# Patient Record
Sex: Female | Born: 1945 | Race: White | Hispanic: No | Marital: Single | Smoking: Never smoker
Health system: Southern US, Community
[De-identification: ages and names within clinical notes are randomized; demographics above are authoritative.]

## PROBLEM LIST (undated history)

## (undated) DIAGNOSIS — M545 Low back pain, unspecified: Secondary | ICD-10-CM

## (undated) DIAGNOSIS — M810 Age-related osteoporosis without current pathological fracture: Secondary | ICD-10-CM

## (undated) DIAGNOSIS — M4310 Spondylolisthesis, site unspecified: Secondary | ICD-10-CM

## (undated) DIAGNOSIS — M1990 Unspecified osteoarthritis, unspecified site: Secondary | ICD-10-CM

## (undated) DIAGNOSIS — M5416 Radiculopathy, lumbar region: Secondary | ICD-10-CM

## (undated) DIAGNOSIS — K5901 Slow transit constipation: Secondary | ICD-10-CM

## (undated) DIAGNOSIS — E7800 Pure hypercholesterolemia, unspecified: Secondary | ICD-10-CM

## (undated) DIAGNOSIS — F32A Depression, unspecified: Secondary | ICD-10-CM

## (undated) DIAGNOSIS — M4800 Spinal stenosis, site unspecified: Secondary | ICD-10-CM

## (undated) DIAGNOSIS — R0982 Postnasal drip: Secondary | ICD-10-CM

## (undated) DIAGNOSIS — D649 Anemia, unspecified: Secondary | ICD-10-CM

## (undated) DIAGNOSIS — E559 Vitamin D deficiency, unspecified: Secondary | ICD-10-CM

## (undated) DIAGNOSIS — R29898 Other symptoms and signs involving the musculoskeletal system: Secondary | ICD-10-CM

## (undated) DIAGNOSIS — M47816 Spondylosis without myelopathy or radiculopathy, lumbar region: Secondary | ICD-10-CM

## (undated) DIAGNOSIS — R51 Headache: Secondary | ICD-10-CM

## (undated) DIAGNOSIS — F429 Obsessive-compulsive disorder, unspecified: Secondary | ICD-10-CM

## (undated) DIAGNOSIS — M5136 Other intervertebral disc degeneration, lumbar region: Secondary | ICD-10-CM

## (undated) DIAGNOSIS — F329 Major depressive disorder, single episode, unspecified: Secondary | ICD-10-CM

## (undated) DIAGNOSIS — M51369 Other intervertebral disc degeneration, lumbar region without mention of lumbar back pain or lower extremity pain: Secondary | ICD-10-CM

## (undated) DIAGNOSIS — J45909 Unspecified asthma, uncomplicated: Secondary | ICD-10-CM

## (undated) DIAGNOSIS — E785 Hyperlipidemia, unspecified: Secondary | ICD-10-CM

## (undated) DIAGNOSIS — F419 Anxiety disorder, unspecified: Secondary | ICD-10-CM

## (undated) DIAGNOSIS — R519 Headache, unspecified: Secondary | ICD-10-CM

## (undated) DIAGNOSIS — M48062 Spinal stenosis, lumbar region with neurogenic claudication: Secondary | ICD-10-CM

## (undated) HISTORY — DX: Low back pain: M54.5

## (undated) HISTORY — DX: Hyperlipidemia, unspecified: E78.5

## (undated) HISTORY — PX: OTHER SURGICAL HISTORY: SHX169

## (undated) HISTORY — DX: Low back pain, unspecified: M54.50

## (undated) HISTORY — DX: Other symptoms and signs involving the musculoskeletal system: R29.898

## (undated) HISTORY — DX: Other intervertebral disc degeneration, lumbar region without mention of lumbar back pain or lower extremity pain: M51.369

## (undated) HISTORY — DX: Spondylosis without myelopathy or radiculopathy, lumbar region: M47.816

## (undated) HISTORY — DX: Vitamin D deficiency, unspecified: E55.9

## (undated) HISTORY — DX: Slow transit constipation: K59.01

## (undated) HISTORY — DX: Other intervertebral disc degeneration, lumbar region: M51.36

## (undated) HISTORY — DX: Spinal stenosis, lumbar region with neurogenic claudication: M48.062

## (undated) HISTORY — DX: Spondylolisthesis, site unspecified: M43.10

## (undated) HISTORY — DX: Age-related osteoporosis without current pathological fracture: M81.0

## (undated) HISTORY — PX: BACK SURGERY: SHX140

## (undated) HISTORY — DX: Postnasal drip: R09.82

## (undated) HISTORY — DX: Radiculopathy, lumbar region: M54.16

## (undated) HISTORY — PX: TONSILLECTOMY: SUR1361

## (undated) HISTORY — PX: EYE SURGERY: SHX253

## (undated) HISTORY — DX: Spinal stenosis, site unspecified: M48.00

---

## 2003-08-25 ENCOUNTER — Other Ambulatory Visit: Admission: RE | Admit: 2003-08-25 | Discharge: 2003-08-25 | Payer: Self-pay | Admitting: Obstetrics and Gynecology

## 2004-04-08 ENCOUNTER — Encounter: Admission: RE | Admit: 2004-04-08 | Discharge: 2004-04-08 | Payer: Self-pay | Admitting: Family Medicine

## 2004-08-25 ENCOUNTER — Other Ambulatory Visit: Admission: RE | Admit: 2004-08-25 | Discharge: 2004-08-25 | Payer: Self-pay | Admitting: Obstetrics and Gynecology

## 2006-04-18 ENCOUNTER — Other Ambulatory Visit: Admission: RE | Admit: 2006-04-18 | Discharge: 2006-04-18 | Payer: Self-pay | Admitting: Obstetrics and Gynecology

## 2007-04-19 ENCOUNTER — Other Ambulatory Visit: Admission: RE | Admit: 2007-04-19 | Discharge: 2007-04-19 | Payer: Self-pay | Admitting: Obstetrics and Gynecology

## 2008-06-10 ENCOUNTER — Other Ambulatory Visit: Admission: RE | Admit: 2008-06-10 | Discharge: 2008-06-10 | Payer: Self-pay | Admitting: Obstetrics and Gynecology

## 2008-07-12 ENCOUNTER — Encounter: Admission: RE | Admit: 2008-07-12 | Discharge: 2008-07-12 | Payer: Self-pay | Admitting: Family Medicine

## 2009-07-31 ENCOUNTER — Other Ambulatory Visit: Admission: RE | Admit: 2009-07-31 | Discharge: 2009-07-31 | Payer: Self-pay | Admitting: Obstetrics and Gynecology

## 2013-05-23 ENCOUNTER — Other Ambulatory Visit: Payer: Self-pay | Admitting: Family Medicine

## 2013-05-23 ENCOUNTER — Other Ambulatory Visit (HOSPITAL_COMMUNITY)
Admission: RE | Admit: 2013-05-23 | Discharge: 2013-05-23 | Disposition: A | Discharge status: Home / Self Care | Payer: Medicare Other | Source: Ambulatory Visit | Attending: Family Medicine | Admitting: Family Medicine

## 2013-05-23 DIAGNOSIS — Z124 Encounter for screening for malignant neoplasm of cervix: Secondary | ICD-10-CM | POA: Insufficient documentation

## 2013-05-23 DIAGNOSIS — R87619 Unspecified abnormal cytological findings in specimens from cervix uteri: Secondary | ICD-10-CM | POA: Insufficient documentation

## 2013-05-23 DIAGNOSIS — Z1151 Encounter for screening for human papillomavirus (HPV): Secondary | ICD-10-CM | POA: Insufficient documentation

## 2015-05-04 ENCOUNTER — Other Ambulatory Visit: Payer: Self-pay | Admitting: Orthopedic Surgery

## 2015-05-04 DIAGNOSIS — M48061 Spinal stenosis, lumbar region without neurogenic claudication: Secondary | ICD-10-CM

## 2015-05-12 ENCOUNTER — Ambulatory Visit
Admission: RE | Admit: 2015-05-12 | Discharge: 2015-05-12 | Disposition: A | Discharge status: Home / Self Care | Payer: Medicare Other | Source: Ambulatory Visit | Attending: Orthopedic Surgery | Admitting: Orthopedic Surgery

## 2015-05-12 DIAGNOSIS — M48061 Spinal stenosis, lumbar region without neurogenic claudication: Secondary | ICD-10-CM

## 2015-07-12 HISTORY — PX: BACK SURGERY: SHX140

## 2015-08-21 DIAGNOSIS — M419 Scoliosis, unspecified: Secondary | ICD-10-CM | POA: Insufficient documentation

## 2015-08-21 DIAGNOSIS — M48061 Spinal stenosis, lumbar region without neurogenic claudication: Secondary | ICD-10-CM | POA: Diagnosis present

## 2015-08-21 DIAGNOSIS — M4310 Spondylolisthesis, site unspecified: Secondary | ICD-10-CM | POA: Insufficient documentation

## 2015-08-21 DIAGNOSIS — M5416 Radiculopathy, lumbar region: Secondary | ICD-10-CM | POA: Insufficient documentation

## 2015-09-02 ENCOUNTER — Other Ambulatory Visit: Payer: Self-pay | Admitting: Neurosurgery

## 2015-09-02 DIAGNOSIS — M4722 Other spondylosis with radiculopathy, cervical region: Secondary | ICD-10-CM

## 2015-09-03 ENCOUNTER — Other Ambulatory Visit: Payer: Self-pay | Admitting: Neurosurgery

## 2015-09-13 ENCOUNTER — Ambulatory Visit
Admission: RE | Admit: 2015-09-13 | Discharge: 2015-09-13 | Disposition: A | Discharge status: Home / Self Care | Payer: Medicare Other | Source: Ambulatory Visit | Attending: Neurosurgery | Admitting: Neurosurgery

## 2015-09-13 DIAGNOSIS — M4722 Other spondylosis with radiculopathy, cervical region: Secondary | ICD-10-CM

## 2015-09-28 ENCOUNTER — Encounter (HOSPITAL_COMMUNITY): Payer: Self-pay

## 2015-09-28 ENCOUNTER — Encounter (HOSPITAL_COMMUNITY)
Admission: RE | Admit: 2015-09-28 | Discharge: 2015-09-28 | Disposition: A | Discharge status: Home / Self Care | Payer: Medicare Other | Source: Ambulatory Visit | Attending: Neurosurgery | Admitting: Neurosurgery

## 2015-09-28 DIAGNOSIS — M4806 Spinal stenosis, lumbar region: Secondary | ICD-10-CM | POA: Insufficient documentation

## 2015-09-28 DIAGNOSIS — Z0183 Encounter for blood typing: Secondary | ICD-10-CM | POA: Diagnosis not present

## 2015-09-28 DIAGNOSIS — J45909 Unspecified asthma, uncomplicated: Secondary | ICD-10-CM | POA: Diagnosis not present

## 2015-09-28 DIAGNOSIS — Z01812 Encounter for preprocedural laboratory examination: Secondary | ICD-10-CM | POA: Diagnosis not present

## 2015-09-28 DIAGNOSIS — Z01818 Encounter for other preprocedural examination: Secondary | ICD-10-CM | POA: Insufficient documentation

## 2015-09-28 HISTORY — DX: Depression, unspecified: F32.A

## 2015-09-28 HISTORY — DX: Pure hypercholesterolemia, unspecified: E78.00

## 2015-09-28 HISTORY — DX: Unspecified osteoarthritis, unspecified site: M19.90

## 2015-09-28 HISTORY — DX: Anxiety disorder, unspecified: F41.9

## 2015-09-28 HISTORY — DX: Major depressive disorder, single episode, unspecified: F32.9

## 2015-09-28 LAB — BASIC METABOLIC PANEL
Anion gap: 10 (ref 5–15)
BUN: 12 mg/dL (ref 4–21)
BUN: 12 mg/dL (ref 6–20)
CO2: 26 mmol/L (ref 22–32)
CREATININE: 0.9 mg/dL (ref 0.5–1.1)
Calcium: 9.8 mg/dL (ref 8.9–10.3)
Chloride: 103 mmol/L (ref 101–111)
Creatinine, Ser: 0.93 mg/dL (ref 0.44–1.00)
GFR calc Af Amer: >60 mL/min (ref >60)
GFR calc non Af Amer: >60 mL/min (ref >60)
Glucose, Bld: 84 mg/dL (ref 65–99)
Glucose: 84 mg/dL
Potassium: 4.6 mmol/L (ref 3.5–5.1)
SODIUM: 139 mmol/L (ref 137–147)
Sodium: 139 mmol/L (ref 135–145)

## 2015-09-28 LAB — ABO/RH: ABO/RH(D): O POS

## 2015-09-28 LAB — SURGICAL PCR SCREEN
MRSA, PCR: NEGATIVE
Staphylococcus aureus: NEGATIVE

## 2015-09-28 LAB — CBC
HCT: 40 % (ref 36.0–46.0)
Hemoglobin: 13.1 g/dL (ref 12.0–15.0)
MCH: 30.3 pg (ref 26.0–34.0)
MCHC: 32.8 g/dL (ref 30.0–36.0)
MCV: 92.6 fL (ref 78.0–100.0)
Platelets: 202 10*3/uL (ref 150–400)
RBC: 4.32 MIL/uL (ref 3.87–5.11)
RDW: 13 % (ref 11.5–15.5)
WBC: 5.1 10*3/uL (ref 4.0–10.5)

## 2015-09-28 LAB — TYPE AND SCREEN
ABO/RH(D): O POS
Antibody Screen: NEGATIVE

## 2015-09-28 LAB — CBC AND DIFFERENTIAL: WBC: 5.1 10^3/mL

## 2015-09-28 NOTE — Pre-Procedure Instructions (Signed)
    Renee PainBarbara S Monier  09/28/2015      [id number] - [place], [place] - [id number] [place] [address] Phone: [phone] Fax: [phone]    Your procedure is scheduled on 10/05/15.  [facility] Ad[facility] at 530 A.M.  Call this number if you have problems the morning of surgery:  [phone]   Remember:  Do not eat food or drink liquids after midnight.  Take these medicines the morning of surgery with A SIP OF WATER --tylenol,xanax,celexa,ultram   Do not wear jewelry, make-up or nail polish.  Do not wear lotions, powders, or perfumes.  You may wear deodorant.  Do not shave 48 hours prior to surgery.  Men may shave face and neck.  Do not bring valuables to the hospital.  [facility] is not responsible for any belongings or valuables.  Contacts, dentures or bridgework may not be worn into surgery.  Leave your suitcase in the car.  After surgery it may be brought to your room.  For patients admitted to the hospital, discharge time will be determined by your treatment team.  Patients discharged the day of surgery will not be allowed to drive home.   Name and phone number of your driver:   Special instructions:    Please read over the following fact sheets that you were given. Pain Booklet, Coughing and Deep Breathing, Blood Transfusion Information, MRSA Information and Surgical Site Infection Prevention

## 2015-10-04 MED ORDER — DEXTROSE 5 % IV SOLN
2.0000 g | INTRAVENOUS | Status: AC
  Administered 2015-10-05 (×2): 2 g via INTRAVENOUS
  Filled 2015-10-04: qty 20

## 2015-10-05 ENCOUNTER — Inpatient Hospital Stay (HOSPITAL_COMMUNITY): Payer: Medicare Other | Admitting: Anesthesiology

## 2015-10-05 ENCOUNTER — Encounter (HOSPITAL_COMMUNITY)
Admission: RE | Disposition: A | Discharge status: Skilled Nursing Facility | Payer: Self-pay | Source: Ambulatory Visit | Attending: Neurosurgery

## 2015-10-05 ENCOUNTER — Inpatient Hospital Stay (HOSPITAL_COMMUNITY)
Admission: RE | Admit: 2015-10-05 | Discharge: 2015-10-07 | DRG: 460 | Disposition: A | Discharge status: Skilled Nursing Facility | Payer: Medicare Other | Source: Ambulatory Visit | Attending: Neurosurgery | Admitting: Neurosurgery

## 2015-10-05 ENCOUNTER — Encounter (HOSPITAL_COMMUNITY): Payer: Self-pay | Admitting: Surgery

## 2015-10-05 ENCOUNTER — Observation Stay (HOSPITAL_COMMUNITY): Payer: Medicare Other

## 2015-10-05 DIAGNOSIS — M4726 Other spondylosis with radiculopathy, lumbar region: Secondary | ICD-10-CM | POA: Diagnosis present

## 2015-10-05 DIAGNOSIS — Z79899 Other long term (current) drug therapy: Secondary | ICD-10-CM

## 2015-10-05 DIAGNOSIS — M4806 Spinal stenosis, lumbar region: Principal | ICD-10-CM | POA: Diagnosis present

## 2015-10-05 DIAGNOSIS — Z7983 Long term (current) use of bisphosphonates: Secondary | ICD-10-CM

## 2015-10-05 DIAGNOSIS — M48062 Spinal stenosis, lumbar region with neurogenic claudication: Secondary | ICD-10-CM | POA: Diagnosis present

## 2015-10-05 DIAGNOSIS — Z880 Allergy status to penicillin: Secondary | ICD-10-CM

## 2015-10-05 DIAGNOSIS — M48061 Spinal stenosis, lumbar region without neurogenic claudication: Secondary | ICD-10-CM | POA: Diagnosis present

## 2015-10-05 DIAGNOSIS — Z419 Encounter for procedure for purposes other than remedying health state, unspecified: Secondary | ICD-10-CM

## 2015-10-05 DIAGNOSIS — M4316 Spondylolisthesis, lumbar region: Secondary | ICD-10-CM | POA: Diagnosis present

## 2015-10-05 DIAGNOSIS — Z881 Allergy status to other antibiotic agents status: Secondary | ICD-10-CM

## 2015-10-05 DIAGNOSIS — E7800 Pure hypercholesterolemia, unspecified: Secondary | ICD-10-CM | POA: Diagnosis present

## 2015-10-05 SURGERY — POSTERIOR LUMBAR FUSION 1 LEVEL
Anesthesia: General | Site: Back

## 2015-10-05 MED ORDER — CITALOPRAM HYDROBROMIDE 40 MG PO TABS
40.0000 mg | ORAL_TABLET | Freq: Every day | ORAL | Status: DC
  Administered 2015-10-06 – 2015-10-07 (×2): 40 mg via ORAL
  Filled 2015-10-05 (×4): qty 1

## 2015-10-05 MED ORDER — OXYCODONE-ACETAMINOPHEN 5-325 MG PO TABS: 1.0000 | ORAL_TABLET | ORAL | Status: DC | PRN

## 2015-10-05 MED ORDER — PHENYLEPHRINE 40 MCG/ML (10ML) SYRINGE FOR IV PUSH (FOR BLOOD PRESSURE SUPPORT)
PREFILLED_SYRINGE | INTRAVENOUS | Status: AC
  Filled 2015-10-05: qty 10

## 2015-10-05 MED ORDER — ONDANSETRON HCL 4 MG/2ML IJ SOLN
INTRAMUSCULAR | Status: AC
  Filled 2015-10-05: qty 2

## 2015-10-05 MED ORDER — ZOLPIDEM TARTRATE 5 MG PO TABS: 5.0000 mg | ORAL_TABLET | Freq: Every evening | ORAL | Status: DC | PRN

## 2015-10-05 MED ORDER — LIDOCAINE HCL (CARDIAC) 20 MG/ML IV SOLN
INTRAVENOUS | Status: AC
  Filled 2015-10-05: qty 5

## 2015-10-05 MED ORDER — ROCURONIUM BROMIDE 50 MG/5ML IV SOLN
INTRAVENOUS | Status: AC
  Filled 2015-10-05: qty 1

## 2015-10-05 MED ORDER — MIDAZOLAM HCL 5 MG/5ML IJ SOLN
INTRAMUSCULAR | Status: DC | PRN
  Administered 2015-10-05: 2 mg via INTRAVENOUS

## 2015-10-05 MED ORDER — PROPOFOL 10 MG/ML IV BOLUS
INTRAVENOUS | Status: AC
  Filled 2015-10-05: qty 20

## 2015-10-05 MED ORDER — KETOROLAC TROMETHAMINE 30 MG/ML IJ SOLN
30.0000 mg | Freq: Four times a day (QID) | INTRAMUSCULAR | Status: AC
  Administered 2015-10-05 – 2015-10-07 (×7): 30 mg via INTRAVENOUS
  Filled 2015-10-05 (×7): qty 1

## 2015-10-05 MED ORDER — GLYCOPYRROLATE 0.2 MG/ML IJ SOLN
INTRAMUSCULAR | Status: AC
  Filled 2015-10-05: qty 2

## 2015-10-05 MED ORDER — OXYCODONE HCL 5 MG/5ML PO SOLN: 5.0000 mg | Freq: Once | ORAL | Status: DC | PRN

## 2015-10-05 MED ORDER — 0.9 % SODIUM CHLORIDE (POUR BTL) OPTIME
TOPICAL | Status: DC | PRN
  Administered 2015-10-05: 1000 mL

## 2015-10-05 MED ORDER — ACETAMINOPHEN 10 MG/ML IV SOLN
INTRAVENOUS | Status: AC
  Administered 2015-10-05: 1000 mg via INTRAVENOUS
  Filled 2015-10-05: qty 100

## 2015-10-05 MED ORDER — FENTANYL CITRATE (PF) 100 MCG/2ML IJ SOLN: 25.0000 ug | INTRAMUSCULAR | Status: DC | PRN

## 2015-10-05 MED ORDER — EPHEDRINE SULFATE 50 MG/ML IJ SOLN
INTRAMUSCULAR | Status: DC | PRN
  Administered 2015-10-05 (×5): 10 mg via INTRAVENOUS

## 2015-10-05 MED ORDER — ONDANSETRON HCL 4 MG/2ML IJ SOLN
INTRAMUSCULAR | Status: DC | PRN
  Administered 2015-10-05: 4 mg via INTRAVENOUS

## 2015-10-05 MED ORDER — ONDANSETRON HCL 4 MG PO TABS: 4.0000 mg | ORAL_TABLET | Freq: Four times a day (QID) | ORAL | Status: DC | PRN

## 2015-10-05 MED ORDER — BISACODYL 10 MG RE SUPP: 10.0000 mg | Freq: Every day | RECTAL | Status: DC | PRN

## 2015-10-05 MED ORDER — MENTHOL 3 MG MT LOZG: 1.0000 | LOZENGE | OROMUCOSAL | Status: DC | PRN

## 2015-10-05 MED ORDER — ALPRAZOLAM 0.25 MG PO TABS
0.2500 mg | ORAL_TABLET | Freq: Two times a day (BID) | ORAL | Status: DC | PRN
  Administered 2015-10-07: 0.5 mg via ORAL
  Filled 2015-10-05: qty 2

## 2015-10-05 MED ORDER — LORATADINE 10 MG PO TABS
10.0000 mg | ORAL_TABLET | Freq: Every day | ORAL | Status: DC
  Administered 2015-10-06 – 2015-10-07 (×2): 10 mg via ORAL
  Filled 2015-10-05 (×2): qty 1

## 2015-10-05 MED ORDER — OXYCODONE HCL 5 MG PO TABS: 5.0000 mg | ORAL_TABLET | Freq: Once | ORAL | Status: DC | PRN

## 2015-10-05 MED ORDER — KETOROLAC TROMETHAMINE 30 MG/ML IJ SOLN
30.0000 mg | Freq: Once | INTRAMUSCULAR | Status: AC
  Administered 2015-10-05: 30 mg via INTRAVENOUS

## 2015-10-05 MED ORDER — FENTANYL CITRATE (PF) 100 MCG/2ML IJ SOLN
INTRAMUSCULAR | Status: DC | PRN
  Administered 2015-10-05 (×4): 50 ug via INTRAVENOUS
  Administered 2015-10-05: 100 ug via INTRAVENOUS

## 2015-10-05 MED ORDER — KETOROLAC TROMETHAMINE 30 MG/ML IJ SOLN
INTRAMUSCULAR | Status: AC
  Filled 2015-10-05: qty 1

## 2015-10-05 MED ORDER — SODIUM CHLORIDE 0.9% FLUSH
3.0000 mL | Freq: Two times a day (BID) | INTRAVENOUS | Status: DC
  Administered 2015-10-05 – 2015-10-06 (×4): 3 mL via INTRAVENOUS

## 2015-10-05 MED ORDER — LIDOCAINE-EPINEPHRINE 1 %-1:100000 IJ SOLN
INTRAMUSCULAR | Status: DC | PRN
  Administered 2015-10-05: 10 mL

## 2015-10-05 MED ORDER — THROMBIN 5000 UNITS EX SOLR
OROMUCOSAL | Status: DC | PRN
  Administered 2015-10-05: 09:00:00 via TOPICAL

## 2015-10-05 MED ORDER — HYDROXYZINE HCL 25 MG PO TABS
50.0000 mg | ORAL_TABLET | ORAL | Status: DC | PRN
  Administered 2015-10-06: 50 mg via ORAL
  Filled 2015-10-05: qty 2

## 2015-10-05 MED ORDER — HYDROCODONE-ACETAMINOPHEN 5-325 MG PO TABS
1.0000 | ORAL_TABLET | ORAL | Status: DC | PRN
Start: 2015-10-05 — End: 2015-10-07
  Administered 2015-10-05 – 2015-10-07 (×8): 2 via ORAL
  Filled 2015-10-05 (×8): qty 2

## 2015-10-05 MED ORDER — LIDOCAINE HCL (CARDIAC) 20 MG/ML IV SOLN
INTRAVENOUS | Status: DC | PRN
  Administered 2015-10-05: 40 mg via INTRAVENOUS

## 2015-10-05 MED ORDER — ACETAMINOPHEN 650 MG RE SUPP: 650.0000 mg | RECTAL | Status: DC | PRN

## 2015-10-05 MED ORDER — ACETAMINOPHEN 325 MG PO TABS
650.0000 mg | ORAL_TABLET | ORAL | Status: DC | PRN
  Administered 2015-10-05: 650 mg via ORAL
  Filled 2015-10-05: qty 2

## 2015-10-05 MED ORDER — CYCLOBENZAPRINE HCL 10 MG PO TABS
10.0000 mg | ORAL_TABLET | Freq: Three times a day (TID) | ORAL | Status: DC | PRN
  Administered 2015-10-05 – 2015-10-06 (×2): 10 mg via ORAL
  Filled 2015-10-05 (×2): qty 1

## 2015-10-05 MED ORDER — FENTANYL CITRATE (PF) 250 MCG/5ML IJ SOLN
INTRAMUSCULAR | Status: AC
  Filled 2015-10-05: qty 5

## 2015-10-05 MED ORDER — MIDAZOLAM HCL 2 MG/2ML IJ SOLN
INTRAMUSCULAR | Status: AC
  Filled 2015-10-05: qty 2

## 2015-10-05 MED ORDER — ONDANSETRON HCL 4 MG/2ML IJ SOLN: 4.0000 mg | Freq: Once | INTRAMUSCULAR | Status: DC | PRN

## 2015-10-05 MED ORDER — PROPOFOL 10 MG/ML IV BOLUS
INTRAVENOUS | Status: DC | PRN
  Administered 2015-10-05: 130 mg via INTRAVENOUS

## 2015-10-05 MED ORDER — EPHEDRINE SULFATE 50 MG/ML IJ SOLN
INTRAMUSCULAR | Status: AC
  Filled 2015-10-05: qty 1

## 2015-10-05 MED ORDER — SODIUM CHLORIDE 0.9% FLUSH
3.0000 mL | INTRAVENOUS | Status: DC | PRN
Start: 2015-10-05 — End: 2015-10-07

## 2015-10-05 MED ORDER — ONDANSETRON HCL 4 MG/2ML IJ SOLN: 4.0000 mg | Freq: Four times a day (QID) | INTRAMUSCULAR | Status: DC | PRN

## 2015-10-05 MED ORDER — VITAMIN D 1000 UNITS PO TABS
1000.0000 [IU] | ORAL_TABLET | Freq: Every day | ORAL | Status: DC
  Administered 2015-10-05 – 2015-10-07 (×3): 1000 [IU] via ORAL
  Filled 2015-10-05 (×3): qty 1

## 2015-10-05 MED ORDER — SODIUM CHLORIDE 0.9 % IR SOLN
Status: DC | PRN
  Administered 2015-10-05: 08:00:00

## 2015-10-05 MED ORDER — LACTATED RINGERS IV SOLN
INTRAVENOUS | Status: DC | PRN
  Administered 2015-10-05 (×2): via INTRAVENOUS

## 2015-10-05 MED ORDER — NEOSTIGMINE METHYLSULFATE 10 MG/10ML IV SOLN
INTRAVENOUS | Status: DC | PRN
  Administered 2015-10-05: 3 mg via INTRAVENOUS

## 2015-10-05 MED ORDER — BUPIVACAINE HCL (PF) 0.5 % IJ SOLN
INTRAMUSCULAR | Status: DC | PRN
  Administered 2015-10-05: 10 mL

## 2015-10-05 MED ORDER — CEFAZOLIN SODIUM-DEXTROSE 2-4 GM/100ML-% IV SOLN
INTRAVENOUS | Status: AC
Start: 2015-10-05 — End: 2015-10-05
  Filled 2015-10-05: qty 100

## 2015-10-05 MED ORDER — GLYCOPYRROLATE 0.2 MG/ML IJ SOLN
INTRAMUSCULAR | Status: DC | PRN
  Administered 2015-10-05: 2 mg via INTRAVENOUS

## 2015-10-05 MED ORDER — ALUM & MAG HYDROXIDE-SIMETH 200-200-20 MG/5ML PO SUSP: 30.0000 mL | Freq: Four times a day (QID) | ORAL | Status: DC | PRN

## 2015-10-05 MED ORDER — PHENYLEPHRINE HCL 10 MG/ML IJ SOLN
INTRAMUSCULAR | Status: DC | PRN
  Administered 2015-10-05 (×2): 80 ug via INTRAVENOUS

## 2015-10-05 MED ORDER — SIMVASTATIN 20 MG PO TABS
20.0000 mg | ORAL_TABLET | Freq: Every evening | ORAL | Status: DC
Start: 2015-10-05 — End: 2015-10-07
  Administered 2015-10-05 – 2015-10-06 (×2): 20 mg via ORAL
  Filled 2015-10-05 (×2): qty 1

## 2015-10-05 MED ORDER — METOPROLOL TARTRATE 1 MG/ML IV SOLN
INTRAVENOUS | Status: AC
  Filled 2015-10-05: qty 5

## 2015-10-05 MED ORDER — ROCURONIUM BROMIDE 100 MG/10ML IV SOLN
INTRAVENOUS | Status: DC | PRN
  Administered 2015-10-05: 50 mg via INTRAVENOUS

## 2015-10-05 MED ORDER — MAGNESIUM HYDROXIDE 400 MG/5ML PO SUSP: 30.0000 mL | Freq: Every day | ORAL | Status: DC | PRN

## 2015-10-05 MED ORDER — THROMBIN 20000 UNITS EX SOLR
CUTANEOUS | Status: DC | PRN
  Administered 2015-10-05: 08:00:00 via TOPICAL

## 2015-10-05 MED ORDER — METOPROLOL TARTRATE 1 MG/ML IV SOLN
INTRAVENOUS | Status: DC | PRN
  Administered 2015-10-05: 1 mg via INTRAVENOUS

## 2015-10-05 MED ORDER — DEXTROSE-NACL 5-0.45 % IV SOLN: INTRAVENOUS | Status: DC

## 2015-10-05 MED ORDER — HYDROXYZINE HCL 50 MG/ML IM SOLN
50.0000 mg | INTRAMUSCULAR | Status: DC | PRN
Start: 2015-10-05 — End: 2015-10-07

## 2015-10-05 MED ORDER — MORPHINE SULFATE (PF) 4 MG/ML IV SOLN: 4.0000 mg | INTRAVENOUS | Status: DC | PRN

## 2015-10-05 MED ORDER — PHENOL 1.4 % MT LIQD: 1.0000 | OROMUCOSAL | Status: DC | PRN

## 2015-10-05 MED FILL — Heparin Sodium (Porcine) Inj 1000 Unit/ML: INTRAMUSCULAR | Qty: 30 | Status: AC

## 2015-10-05 MED FILL — Sodium Chloride IV Soln 0.9%: INTRAVENOUS | Qty: 1000 | Status: AC

## 2015-10-05 SURGICAL SUPPLY — 78 items
ADH SKN CLS APL DERMABOND .7 (GAUZE/BANDAGES/DRESSINGS) ×2
BAG DECANTER FOR FLEXI CONT (MISCELLANEOUS) ×2 IMPLANT
BLADE CLIPPER SURG (BLADE) IMPLANT
BRUSH SCRUB EZ PLAIN DRY (MISCELLANEOUS) ×2 IMPLANT
BUR ACRON 5.0MM COATED (BURR) ×2 IMPLANT
BUR MATCHSTICK NEURO 3.0 LAGG (BURR) ×2 IMPLANT
CANISTER SUCT 3000ML PPV (MISCELLANEOUS) ×2 IMPLANT
CAP LCK SPNE (Orthopedic Implant) ×4 IMPLANT
CAP LOCK SPINE RADIUS (Orthopedic Implant) IMPLANT
CAP LOCKING (Orthopedic Implant) ×8 IMPLANT
CONT SPEC 4OZ CLIKSEAL STRL BL (MISCELLANEOUS) ×2 IMPLANT
COVER BACK TABLE 60X90IN (DRAPES) ×2 IMPLANT
DERMABOND ADVANCED (GAUZE/BANDAGES/DRESSINGS) ×2
DERMABOND ADVANCED .7 DNX12 (GAUZE/BANDAGES/DRESSINGS) ×2 IMPLANT
DRAPE C-ARM 42X72 X-RAY (DRAPES) ×4 IMPLANT
DRAPE LAPAROTOMY 100X72X124 (DRAPES) ×2 IMPLANT
DRAPE POUCH INSTRU U-SHP 10X18 (DRAPES) ×2 IMPLANT
DRAPE PROXIMA HALF (DRAPES) IMPLANT
DRSG EMULSION OIL 3X3 NADH (GAUZE/BANDAGES/DRESSINGS) IMPLANT
ELECT CAUTERY BLADE 6.4 (BLADE) ×1 IMPLANT
ELECT REM PT RETURN 9FT ADLT (ELECTROSURGICAL) ×2
ELECTRODE REM PT RTRN 9FT ADLT (ELECTROSURGICAL) ×1 IMPLANT
GAUZE SPONGE 4X4 12PLY STRL (GAUZE/BANDAGES/DRESSINGS) ×2 IMPLANT
GAUZE SPONGE 4X4 16PLY XRAY LF (GAUZE/BANDAGES/DRESSINGS) IMPLANT
GLOVE BIOGEL PI IND STRL 7.5 (GLOVE) IMPLANT
GLOVE BIOGEL PI IND STRL 8 (GLOVE) ×2 IMPLANT
GLOVE BIOGEL PI INDICATOR 7.5 (GLOVE) ×1
GLOVE BIOGEL PI INDICATOR 8 (GLOVE) ×6
GLOVE ECLIPSE 6.5 STRL STRAW (GLOVE) ×1 IMPLANT
GLOVE ECLIPSE 7.5 STRL STRAW (GLOVE) ×8 IMPLANT
GLOVE EXAM NITRILE LRG STRL (GLOVE) IMPLANT
GLOVE EXAM NITRILE MD LF STRL (GLOVE) IMPLANT
GLOVE EXAM NITRILE XL STR (GLOVE) IMPLANT
GLOVE EXAM NITRILE XS STR PU (GLOVE) IMPLANT
GOWN STRL REUS W/ TWL LRG LVL3 (GOWN DISPOSABLE) ×1 IMPLANT
GOWN STRL REUS W/ TWL XL LVL3 (GOWN DISPOSABLE) ×1 IMPLANT
GOWN STRL REUS W/TWL 2XL LVL3 (GOWN DISPOSABLE) ×2 IMPLANT
GOWN STRL REUS W/TWL LRG LVL3 (GOWN DISPOSABLE) ×2
GOWN STRL REUS W/TWL XL LVL3 (GOWN DISPOSABLE) ×4
HEMOSTAT POWDER SURGIFOAM 1G (HEMOSTASIS) ×2 IMPLANT
KIT BASIN OR (CUSTOM PROCEDURE TRAY) ×2 IMPLANT
KIT INFUSE SMALL (Orthopedic Implant) ×2 IMPLANT
KIT ROOM TURNOVER OR (KITS) ×2 IMPLANT
MILL MEDIUM DISP (BLADE) IMPLANT
NDL ASP BONE MRW 8GX15 (NEEDLE) IMPLANT
NDL SPNL 18GX3.5 QUINCKE PK (NEEDLE) IMPLANT
NDL SPNL 22GX3.5 QUINCKE BK (NEEDLE) ×2 IMPLANT
NEEDLE ASP BONE MRW 8GX15 (NEEDLE) ×2 IMPLANT
NEEDLE BONE MARROW 8GAX6 (NEEDLE) IMPLANT
NEEDLE SPNL 18GX3.5 QUINCKE PK (NEEDLE) IMPLANT
NEEDLE SPNL 22GX3.5 QUINCKE BK (NEEDLE) ×2 IMPLANT
NS IRRIG 1000ML POUR BTL (IV SOLUTION) ×2 IMPLANT
PACK LAMINECTOMY NEURO (CUSTOM PROCEDURE TRAY) ×2 IMPLANT
PAD ARMBOARD 7.5X6 YLW CONV (MISCELLANEOUS) ×6 IMPLANT
PATTIES SURGICAL .5 X.5 (GAUZE/BANDAGES/DRESSINGS) IMPLANT
PATTIES SURGICAL .5 X1 (DISPOSABLE) IMPLANT
PATTIES SURGICAL 1X1 (DISPOSABLE) IMPLANT
PEEK PLIF AVS 10X25X4 (Peek) ×4 IMPLANT
PENCIL BUTTON HOLSTER BLD 10FT (ELECTRODE) ×1 IMPLANT
ROD 5.5X30MM (Rod) ×2 IMPLANT
ROD RADIUS 35MM (Rod) ×1 IMPLANT
SCREW 5.75X40M (Screw) ×2 IMPLANT
SCREW 5.75X45MM (Screw) ×2 IMPLANT
SPONGE LAP 4X18 X RAY DECT (DISPOSABLE) IMPLANT
SPONGE NEURO XRAY DETECT 1X3 (DISPOSABLE) IMPLANT
SPONGE SURGIFOAM ABS GEL 100 (HEMOSTASIS) ×2 IMPLANT
STRIP BIOACTIVE VITOSS 25X100X (Neuro Prosthesis/Implant) ×1 IMPLANT
STRIP BIOACTIVE VITOSS 25X52X4 (Orthopedic Implant) ×1 IMPLANT
SUT VIC AB 1 CT1 18XBRD ANBCTR (SUTURE) ×2 IMPLANT
SUT VIC AB 1 CT1 8-18 (SUTURE) ×4
SUT VIC AB 2-0 CP2 18 (SUTURE) ×6 IMPLANT
SYR 3ML LL SCALE MARK (SYRINGE) ×4 IMPLANT
SYR CONTROL 10ML LL (SYRINGE) ×2 IMPLANT
TAPE CLOTH SURG 4X10 WHT LF (GAUZE/BANDAGES/DRESSINGS) ×2 IMPLANT
TOWEL OR 17X24 6PK STRL BLUE (TOWEL DISPOSABLE) ×2 IMPLANT
TOWEL OR 17X26 10 PK STRL BLUE (TOWEL DISPOSABLE) ×2 IMPLANT
TRAY FOLEY W/METER SILVER 14FR (SET/KITS/TRAYS/PACK) ×2 IMPLANT
WATER STERILE IRR 1000ML POUR (IV SOLUTION) ×2 IMPLANT

## 2015-10-05 NOTE — Anesthesia Preprocedure Evaluation (Addendum)
Anesthesia Evaluation  Patient identified by MRN, date of birth, ID band Patient awake    Reviewed: Allergy & Precautions, NPO status , Patient's Chart, lab work & pertinent test results  Airway Mallampati: II  TM Distance: >3 FB Neck ROM: Full    Dental  (+) Teeth Intact, Dental Advisory Given   Pulmonary  breath sounds clear to auscultation        Cardiovascular Rhythm:Regular Rate:Normal     Neuro/Psych    GI/Hepatic   Endo/Other    Renal/GU      Musculoskeletal   Abdominal   Peds  Hematology   Anesthesia Other Findings   Reproductive/Obstetrics                             Anesthesia Physical Anesthesia Plan  ASA: II  Anesthesia Plan: General   Post-op Pain Management:    Induction: Intravenous  Airway Management Planned: Oral ETT  Additional Equipment:   Intra-op Plan:   Post-operative Plan: Extubation in OR  Informed Consent: I have reviewed the patients History and Physical, chart, labs and discussed the procedure including the risks, benefits and alternatives for the proposed anesthesia with the patient or authorized representative who has indicated his/her understanding and acceptance.   Dental advisory given  Plan Discussed with: CRNA and Anesthesiologist  Anesthesia Plan Comments: (Plan GA with oral ETT  Orlanda Lemmerman)        Anesthesia Quick Evaluation  

## 2015-10-05 NOTE — Anesthesia Postprocedure Evaluation (Signed)
Anesthesia Post Note  Patient: Brittany [name]  Procedure(s) Performed: Procedure(s) (LRB): Lumbar four-five Decompression/posterior lumbar interbody fusion/posterolateral arthrodesis (N/A)  Patient location during evaluation: PACU Anesthesia Type: General Level of consciousness: awake, awake and alert and oriented Pain management: pain level controlled Vital Signs Assessment: post-procedure vital signs reviewed and stable Respiratory status: spontaneous breathing Anesthetic complications: no    Last Vitals:  Filed Vitals:   10/05/15 1211 10/05/15 1606  BP: 116/57 107/45  Pulse: 68 66  Temp: 36.5 C 36.8 C  Resp: 18 18    Last Pain:  Filed Vitals:   10/05/15 1622  PainSc: Asleep                 Tanara Turvey COKER

## 2015-10-05 NOTE — H&P (Signed)
Subjective: Patient is a 70 y.o. right-handed white female who is admitted for treatment of low back and left lumbar radicular pain, numbness, and tingling.  Patient has multilevel degenerative changes lumbar spine with severe stenosis at L4-5 and more mild stenosis and impingement at the L2-3 and L3-4 levels. She has a grade 1 degenerative spondylolisthesis of L4 on 5.  We've discussed the alternatives of a 3 level decompression and stabilization versus more limited L4-5 lumbar decompression and stabilization, understanding that subsequent surgery at the L2-3 and L3-4 levels may be necessary. After discussing all this patient has elected more limited approach, and is therefore admitted for an L4-5 lumbar decompression including laminectomy, facetectomy, and foraminotomies, and a L4-5 lumbar stabilization including posterior lumbar interbody arthrodesis and posterior lateral arthrodesis with interbody implants, posterior instrumentation, and bone graft.   Past Medical History  Diagnosis Date  . Depression   . Anxiety   . Arthritis   . Hypercholesteremia     Past Surgical History  Procedure Laterality Date  . Tonsillectomy    . Eye surgery      Prescriptions prior to admission  Medication Sig Dispense Refill Last Dose  . acetaminophen (TYLENOL) 500 MG tablet Take 1,000 mg by mouth every 8 (eight) hours as needed for mild pain or moderate pain.   10/05/2015 at 0430  . alendronate (FOSAMAX) 70 MG tablet Take 70 mg by mouth once a week. Take with a full glass of water on an empty stomach.   Past Week at Unknown time  . ALPRAZolam (XANAX) 0.25 MG tablet Take 0.25-0.5 mg by mouth 2 (two) times daily as needed for anxiety or sleep.   10/05/2015 at 0430  . calcium carbonate (TUMS EX) 750 MG chewable tablet Chew 1 tablet by mouth daily.   10/04/2015 at Unknown time  . cholecalciferol (VITAMIN D) 1000 units tablet Take 1,000 Units by mouth daily.   10/04/2015 at Unknown time  . citalopram (CELEXA) 40 MG  tablet Take 40 mg by mouth daily.   10/05/2015 at 0430  . docusate sodium (COLACE) 100 MG capsule Take 100 mg by mouth daily.   10/04/2015 at Unknown time  . loratadine (CLARITIN) 10 MG tablet Take 10 mg by mouth daily.   10/04/2015 at Unknown time  . meloxicam (MOBIC) 15 MG tablet Take 15 mg by mouth daily.   Past Week at Unknown time  . Misc Natural Products (GLUCOSAMINE CHONDROITIN TRIPLE PO) Take 1 tablet by mouth daily.   10/04/2015 at Unknown time  . simvastatin (ZOCOR) 20 MG tablet Take 20 mg by mouth daily.   10/04/2015 at Unknown time  . tiZANidine (ZANAFLEX) 4 MG capsule Take 8 mg by mouth at bedtime.   10/04/2015 at Unknown time  . traMADol (ULTRAM) 50 MG tablet Take 100 mg by mouth 2 (two) times daily as needed for moderate pain.   10/05/2015 at 0430  . vitamin B-12 (CYANOCOBALAMIN) 500 MCG tablet Take 500 mcg by mouth daily.   10/04/2015 at Unknown time   Allergies  Allergen Reactions  . Penicillins     Has patient had a PCN reaction causing immediate rash, facial/tongue/throat swelling, SOB or lightheadedness with hypotension: No No Has patient had a PCN reaction causing severe rash involving mucus membranes or skin necrosis: No NO Has patient had a PCN reaction that required hospitalization No No Has patient had a PCN reaction occurring within the last 10 years: NoNo If all of the above answers are "NO", then may proceed with Cephalosporin   .  Tetracyclines & Related Other (See Comments)    Throat ulcer    Social History  Substance Use Topics  . Smoking status: Never Smoker   . Smokeless tobacco: Not on file  . Alcohol Use: No    History reviewed. No pertinent family history.   Review of Systems A comprehensive review of systems was negative.  Objective: Vital signs in last 24 hours: Temp:  [98.6 F (37 C)] 98.6 F (37 C) (03/27 0607) Pulse Rate:  [73] 73 (03/27 0607) Resp:  [18] 18 (03/27 0607) BP: (127)/(81) 127/81 mmHg (03/27 0607) SpO2:  [98 %] 98 % (03/27  0607)  EXAM:  Patient is a well-developed well-nourished white female in no acute distress. Lungs are clear to auscultation , the patient has symmetrical respiratory excursion. Heart has a regular rate and rhythm normal S1 and S2 no murmur.   Abdomen is soft nontender nondistended bowel sounds are present. Extremity examination shows no clubbing cyanosis or edema. Motor examination shows 5 over 5 strength in the lower extremities including the iliopsoas quadriceps dorsiflexor extensor hallicus  longus and plantar flexor bilaterally. Sensation is intact to pinprick in the distal lower extremities. Reflexes are symmetrical bilaterally. No pathologic reflexes are present. Patient has a normal gait and stance.   Data Review:CBC    Component Value Date/Time   WBC 5.1 09/28/2015 1043   RBC 4.32 09/28/2015 1043   HGB 13.1 09/28/2015 1043   HCT 40.0 09/28/2015 1043   PLT 202 09/28/2015 1043   MCV 92.6 09/28/2015 1043   MCH 30.3 09/28/2015 1043   MCHC 32.8 09/28/2015 1043   RDW 13.0 09/28/2015 1043                          BMET    Component Value Date/Time   NA 139 09/28/2015 1043   K 4.6 09/28/2015 1043   CL 103 09/28/2015 1043   CO2 26 09/28/2015 1043   GLUCOSE 84 09/28/2015 1043   BUN 12 09/28/2015 1043   CREATININE 0.93 09/28/2015 1043   CALCIUM 9.8 09/28/2015 1043   GFRNONAA >60 09/28/2015 1043   GFRAA >60 09/28/2015 1043     Assessment/Plan: Patient with advanced degeneration in lumbar spine, with the most severe degeneration at the L4-5 level. She is admitted now for L4-5 lumbar decompression and stabilization.  I've discussed with the patient the nature of his condition, the nature the surgical procedure, the typical length of surgery, hospital stay, and overall recuperation, the limitations postoperatively, and risks of surgery. I discussed risks including risks of infection, bleeding, possibly need for transfusion, the risk of nerve root dysfunction with pain, weakness,  numbness, or paresthesias, the risk of dural tear and CSF leakage and possible need for further surgery, the risk of failure of the arthrodesis and possibly for further surgery, the risk of anesthetic complications including myocardial infarction, stroke, pneumonia, and death. We discussed the need for postoperative immobilization in a lumbar brace. Understanding all this the patient does wish to proceed with surgery and is admitted for such.     Hewitt ShortsNUDELMAN,ROBERT W, MD 10/05/2015 7:20 AM

## 2015-10-05 NOTE — Op Note (Signed)
10/05/2015  11:19 AM  PATIENT:  Brittany [name]  70 y.o. female  PRE-OPERATIVE DIAGNOSIS:  L4-5 lumbar stenosis with neurogenic claudication, L4-5 grade 1 degenerative spondylolisthesis, lumbar spondylosis, lumbar degenerative disease, lumbago, left lumbar radiculopathy  POST-OPERATIVE DIAGNOSIS:  L4-5 lumbar stenosis with neurogenic claudication, L4-5 grade 1 degenerative spondylolisthesis, lumbar spondylosis, lumbar degenerative disease, lumbago, left lumbar radiculopathy  PROCEDURE:  Procedure(s):  Bilateral L4-5 lumbar decompression including L4 and L5 laminectomy, L4-5 facetectomy, and foraminotomies for decompression of the stenotic compression of the exiting L4 and L5 nerve roots, with decompression beyond that required for interbody arthrodesis; bilateral L4-5 posterior lumbar interbody arthrodesis with AVS peek interbody implants, Vitoss BA with bone marrow aspirate, and infuse; bilateral L4-5 posterior lateral arthrodesis with nonsegmental radius posterior instrumentation, Vitoss BA with bone marrow aspirate, and infuse  SURGEON:  Surgeon(s): Shirlean Kelly, MD  ANESTHESIA:   general  EBL:  Total I/O In: 1700 [I.V.:1700] Out: 825 [Urine:575; Blood:250]  BLOOD ADMINISTERED:none  CELL SAVER GIVEN:  Cell saver technician felt there was insufficient blood loss to process the collected blood  COUNT:  Correct per nursing staff  DICTATION: Patient is brought to the operating room placed under general endotracheal anesthesia. The patient was turned to prone position the lumbar region was prepped with Betadine soap and solution and draped in a sterile fashion. The midline was infiltrated with local anesthesia with epinephrine. A localizing x-ray was taken and then a midline incision was made carried down through the subcutaneous tissue, bipolar cautery and electrocautery were used to maintain hemostasis. Dissection was carried down to the lumbar fascia. The fascia was incised  bilaterally and the paraspinal muscles were dissected with a spinous process and lamina in a subperiosteal fashion. Another x-ray was taken for localization and the L4-5 level was localized. Dissection was then carried out laterally over the facet complex and the transverse processes of L4 and L5 were exposed and decorticated.    bilateral L4-5 laminotomies were performed using the high-speed drill, double-action rongeurs,  and Kerrison punches. Dissection was carried out laterally including facetectomy and foraminotomies with decompression of the stenotic compression of the L4 and L5 nerve roots bilaterally. Once the decompression of the stenotic compression of the thecal sac and exiting nerve roots was completed we proceeded with the posterior lumbar interbody arthrodesis. The annulus was incised bilaterally and the disc space entered. A thorough discectomy was performed using pituitary rongeurs and curettes. Once the discectomy was completed we began to prepare the endplate surfaces removing the cartilaginous endplates surfaces. We then measured the height of the intervertebral disc space. We selected 10 x 25 x 4 AVS peek interbody implants.  The C-arm fluoroscope was then draped and brought in the field and we identified the pedicle entry points bilaterally at the L4 and L5 levels. Each of the 4 pedicles was probed, we aspirated bone marrow aspirate from the vertebral bodies, this was injected over a 10 cc and a 5 cc strip of Vitoss BA. Then each of the pedicles was examined with the ball probe good bony surfaces were found and no bony cuts were found. Each of the pedicles was then tapped with a 5.25 mm tap, again examined with the ball probe good threading was found and no bony cuts were found. We then placed 5.75 by 45 millimeter screws bilaterally at the L4 level and 5.75 by 40 millimeter screws bilaterally at the L5 level.  We then packed the AVS peek interbody implants with Vitoss BA with bone marrow  aspirate and infuse, and then placed the first implant and on the right side, carefully retracting the thecal sac and nerve root medially. We then went back to the left side and packed the midline with additional Vitoss BA with bone marrow aspirate and infuse, and then placed a second implant on the left side again retracting the thecal sac and nerve root medially. Additional Vitoss BA with bone marrow aspirate and infuse was packed lateral to the implants.  We then packed the lateral gutter over the transverse processes and intertransverse space with Vitoss BA with bone marrow aspirate and infuse. We then selected prelordosed rods.  Using a 30 mm rod on the left and a 35 mm rod on the right.  They were placed within the screw heads and secured with locking caps once all 4 locking caps were placed final tightening was performed against a counter torque.  The wound had been irrigated multiple times during the procedure with saline solution and bacitracin solution, good hemostasis was established with a combination of bipolar cautery and Gelfoam with thrombin. Once good hemostasis was confirmed we proceeded with closure paraspinal muscles deep fascia and Scarpa's fascia were closed with interrupted undyed 1 Vicryl sutures the subcutaneous and subcuticular closed with interrupted inverted 2-0 undyed Vicryl sutures the skin edges were approximated with dermabond.   A dressing of sterile gauze and Hypafix was applied.   Following surgery the patient was turned back to the supine position to be reversed and the anesthetic extubated and transferred to the recovery room for further care.   PLAN OF CARE: Admit for overnight observation  PATIENT DISPOSITION:  PACU - hemodynamically stable.   Delay start of Pharmacological VTE agent (>24hrs) due to surgical blood loss or risk of bleeding:  yes

## 2015-10-05 NOTE — Transfer of Care (Signed)
Immediate Anesthesia Transfer of Care Note  Patient: Brittany PainBarbara S Snider  Procedure(s) Performed: Procedure(s): Lumbar four-five Decompression/posterior lumbar interbody fusion/posterolateral arthrodesis (N/A)  Patient Location: PACU  Anesthesia Type:General  Level of Consciousness: awake, alert , oriented and patient cooperative  Airway & Oxygen Therapy: Patient Spontanous Breathing and Patient connected to nasal cannula oxygen  Post-op Assessment: Report given to RN and Post -op Vital signs reviewed and stable  Post vital signs: Reviewed and stable  Last Vitals:  Filed Vitals:   10/05/15 0607 10/05/15 1109  BP: 127/81 131/67  Pulse: 73 100  Temp: 37 C   Resp: 18     Complications: No apparent anesthesia complications

## 2015-10-05 NOTE — Progress Notes (Signed)
Filed Vitals:   10/05/15 1130 10/05/15 1141 10/05/15 1211 10/05/15 1606  BP: 110/61 110/61 116/57 107/45  Pulse: 79 80 68 66  Temp:  97.9 F (36.6 C) 97.7 F (36.5 C) 98.2 F (36.8 C)  TempSrc:      Resp: _0 SpO2: 98% 94% 98% 99%    Patient up and ambulating in the halls with staff. Mildly wobbly. Dressing clean and dry. Foley DC'd, voiding.  Plan: Continue to progress through postoperative recovery.  Encouraged to ambulate. PT and OT consultations requested. Patient met with admission staff at [facility], prior to hospitalization, regarding postoperative rehabilitation, and they are expecting her. Social work consultation also requested.  Hosie Spangle, MD 10/05/2015, 5:39 PM

## 2015-10-05 NOTE — Anesthesia Procedure Notes (Addendum)
Procedure Name: Intubation Date/Time: 10/05/2015 7:40 AM Performed by: Faustino CongressWHITE, Margan Elias TENA Geneen Dieter Pre-anesthesia Checklist: Patient identified, Emergency Drugs available, Suction available and Patient being monitored Patient Re-evaluated:Patient Re-evaluated prior to inductionOxygen Delivery Method: Circle system utilized Preoxygenation: Pre-oxygenation with 100% oxygen Intubation Type: IV induction Ventilation: Mask ventilation without difficulty Laryngoscope Size: Glidescope and 3 Grade View: Grade I Tube type: Oral Tube size: 7.5 mm Number of attempts: 1 Airway Equipment and Method: Stylet and Video-laryngoscopy Placement Confirmation: ETT inserted through vocal cords under direct vision,  positive ETCO2 and breath sounds checked- equal and bilateral Secured at: 21 cm Tube secured with: Tape Dental Injury: Teeth and Oropharynx as per pre-operative assessment

## 2015-10-06 DIAGNOSIS — M4726 Other spondylosis with radiculopathy, lumbar region: Secondary | ICD-10-CM | POA: Diagnosis present

## 2015-10-06 DIAGNOSIS — M4316 Spondylolisthesis, lumbar region: Secondary | ICD-10-CM | POA: Diagnosis present

## 2015-10-06 DIAGNOSIS — M545 Low back pain: Secondary | ICD-10-CM | POA: Diagnosis present

## 2015-10-06 DIAGNOSIS — E7800 Pure hypercholesterolemia, unspecified: Secondary | ICD-10-CM | POA: Diagnosis present

## 2015-10-06 DIAGNOSIS — M4806 Spinal stenosis, lumbar region: Secondary | ICD-10-CM | POA: Diagnosis present

## 2015-10-06 DIAGNOSIS — Z881 Allergy status to other antibiotic agents status: Secondary | ICD-10-CM | POA: Diagnosis not present

## 2015-10-06 DIAGNOSIS — Z880 Allergy status to penicillin: Secondary | ICD-10-CM | POA: Diagnosis not present

## 2015-10-06 DIAGNOSIS — Z79899 Other long term (current) drug therapy: Secondary | ICD-10-CM | POA: Diagnosis not present

## 2015-10-06 DIAGNOSIS — Z7983 Long term (current) use of bisphosphonates: Secondary | ICD-10-CM | POA: Diagnosis not present

## 2015-10-06 MED ORDER — CYCLOBENZAPRINE HCL 5 MG PO TABS
5.0000 mg | ORAL_TABLET | Freq: Three times a day (TID) | ORAL | Status: DC | PRN
  Administered 2015-10-06 – 2015-10-07 (×2): 10 mg via ORAL
  Filled 2015-10-06 (×2): qty 2

## 2015-10-06 MED ORDER — MAGNESIUM CITRATE PO SOLN
1.0000 | Freq: Once | ORAL | Status: DC
  Filled 2015-10-06: qty 296

## 2015-10-06 NOTE — [place] FL2 (Signed)
[place] MEDICAID FL2 LEVEL OF CARE SCREENING TOOL     IDENTIFICATION  Patient Name: Brittany PainBarbara S Balik Birthdate: 04/25/1946 Sex: female Admission Date (Current Location): 10/05/2015  [facility] [place]   Facility and Address:  [facility], [address], [place], [place] [address]      Provider Number: [id number]  Attending Physician Name and Address:  Shirlean Kellyobert Nudelman, MD  Relative Name and Phone Number:  Olegario MessierKathy, sister, [phone]    Current Level of Care: Hospital Recommended Level of Care: Skilled Nursing Facility Prior Approval Number:    Date Approved/Denied:   PASRR Number: [id number]  Discharge Plan: SNF    Current Diagnoses: Patient Active Problem List   Diagnosis Date Noted  . Lumbar stenosis with neurogenic claudication 10/05/2015    Orientation RESPIRATION BLADDER Height & Weight     Self, Time, Situation, Place  Normal Continent Weight:   Height:     BEHAVIORAL SYMPTOMS/MOOD NEUROLOGICAL BOWEL NUTRITION STATUS      Continent  (Please see DC summary)  AMBULATORY STATUS COMMUNICATION OF NEEDS Skin   Limited Assist Verbally Surgical wounds (Closed incision on back)                       Personal Care Assistance Level of Assistance  Bathing, Feeding, Dressing Bathing Assistance: Limited assistance Feeding assistance: Independent Dressing Assistance: Limited assistance     Functional Limitations Info             SPECIAL CARE FACTORS FREQUENCY  PT (By licensed PT)     PT Frequency: Not yet assessed              Contractures      Additional Factors Info  Code Status, Allergies Code Status Info: Full Allergies Info: Penicillins, Tetracyclines & Related           Current Medications (10/06/2015):  This is the current hospital active medication list Current Facility-Administered Medications  Medication Dose Route Frequency Provider Last Rate Last Dose  . acetaminophen (TYLENOL)  tablet 650 mg  650 mg Oral Q4H PRN Shirlean Kellyobert Nudelman, MD   650 mg at 10/05/15 2312   Or  . acetaminophen (TYLENOL) suppository 650 mg  650 mg Rectal Q4H PRN Shirlean Kellyobert Nudelman, MD      . ALPRAZolam Prudy Feeler(XANAX) tablet 0.25-0.5 mg  0.25-0.5 mg Oral BID PRN Shirlean Kellyobert Nudelman, MD      . alum & mag hydroxide-simeth (MAALOX/MYLANTA) 200-200-20 MG/5ML suspension 30 mL  30 mL Oral Q6H PRN Shirlean Kellyobert Nudelman, MD      . bisacodyl (DULCOLAX) suppository 10 mg  10 mg Rectal Daily PRN Shirlean Kellyobert Nudelman, MD      . cholecalciferol (VITAMIN D) tablet 1,000 Units  1,000 Units Oral Daily Shirlean Kellyobert Nudelman, MD   1,000 Units at 10/06/15 0900  . citalopram (CELEXA) tablet 40 mg  40 mg Oral Daily Shirlean Kellyobert Nudelman, MD   40 mg at 10/06/15 0900  . cyclobenzaprine (FLEXERIL) tablet 5-10 mg  5-10 mg Oral TID PRN Shirlean Kellyobert Nudelman, MD      . HYDROcodone-acetaminophen (NORCO/VICODIN) 5-325 MG per tablet 1-2 tablet  1-2 tablet Oral Q4H PRN Shirlean Kellyobert Nudelman, MD   2 tablet at 10/06/15 0911  . hydrOXYzine (ATARAX/VISTARIL) tablet 50 mg  50 mg Oral Q3H PRN Shirlean Kellyobert Nudelman, MD   50 mg at 10/06/15 0846  . hydrOXYzine (VISTARIL) injection 50 mg  50 mg Intramuscular Q4H PRN Shirlean Kellyobert Nudelman, MD      . ketorolac (  TORADOL) 30 MG/ML injection 30 mg  30 mg Intravenous 4 times per day Shirlean Kelly, MD   30 mg at 10/06/15 0539  . loratadine (CLARITIN) tablet 10 mg  10 mg Oral Daily Shirlean Kelly, MD   10 mg at 10/06/15 0900  . magnesium hydroxide (MILK OF MAGNESIA) suspension 30 mL  30 mL Oral Daily PRN Shirlean Kelly, MD      . menthol-cetylpyridinium (CEPACOL) lozenge 3 mg  1 lozenge Oral PRN Shirlean Kelly, MD       Or  . phenol (CHLORASEPTIC) mouth spray 1 spray  1 spray Mouth/Throat PRN Shirlean Kelly, MD      . ondansetron [facility]) injection 4-8 mg  4-8 mg Intravenous Q6H PRN Shirlean Kelly, MD      . ondansetron [facility]) tablet 4-8 mg  4-8 mg Oral Q6H PRN Shirlean Kelly, MD      . simvastatin (ZOCOR) tablet 20 mg  20 mg Oral QPM Shirlean Kelly,  MD   20 mg at 10/05/15 1915  . sodium chloride flush (NS) 0.9 % injection 3 mL  3 mL Intravenous Q12H Shirlean Kelly, MD   3 mL at 10/06/15 0900  . sodium chloride flush (NS) 0.9 % injection 3 mL  3 mL Intravenous PRN Shirlean Kelly, MD      . zolpidem [facility]) tablet 5 mg  5 mg Oral QHS PRN Shirlean Kelly, MD         Discharge Medications: Please see discharge summary for a list of discharge medications.  Relevant Imaging Results:  Relevant Lab Results:   Additional Information SSN: [id number] [place], [place]

## 2015-10-06 NOTE — Evaluation (Signed)
Occupational Therapy Evaluation Patient Details Name: Brittany [name] MRN: [id number] DOB: November 01, 1945 Today's Date: 10/06/2015    History of Present Illness 70 y.o. s/p Lumbar four-five Decompression/posterior lumbar interbody fusion/posterolateral arthrodesis. PMH includes depression, anxiety, arthritis, hypercholesteremia.   Clinical Impression   Pt s/p above. Pt independent with ADLs, PTA. Feel pt will benefit from acute OT to increase independence prior to d/c. Pt's plan is to d/c to SNF.    Follow Up Recommendations  SNF (pt planning to d/c to SNF-met with [facility], per note, prior to admission)    Equipment Recommendations  Other (comment) (defer to next venue)    Recommendations for Other Services       Precautions / Restrictions Precautions Precautions: Back;Fall Precaution Booklet Issued: No Precaution Comments: educated on back precautions Required Braces or Orthoses: Spinal Brace Spinal Brace: Lumbar corset (already on in session-adjusted in standing) Restrictions Weight Bearing Restrictions: No      Mobility Bed Mobility Overal bed mobility: Needs Assistance Bed Mobility: Sit to Sidelying;Rolling Rolling: Supervision       Sit to sidelying: Supervision General bed mobility comments: cues for technique.  Transfers Overall transfer level: Needs assistance   Transfers: Sit to/from Stand Sit to Stand: Supervision              Balance      Min assist-Min guard for ambulation. Single leg stance difficult.                                       ADL Overall ADL's : Needs assistance/impaired Eating/Feeding: Supervision/ safety;Bed level               Upper Body Dressing : Minimal assistance;Sitting;Standing   Lower Body Dressing: Sit to/from stand;Minimal assistance   Toilet Transfer: Minimal assistance;Ambulation (sit to stand from chair and bed)           Functional mobility during ADLs: Minimal assistance  (Min assist-Min guard for ambulation) General ADL Comments: discussed back brace. Briefly talked about AE.      Vision     Perception     Praxis      Pertinent Vitals/Pain Pain Assessment: 0-10 Pain Score: 5  Pain Location: back and legs Pain Descriptors / Indicators: Cramping Pain Intervention(s): Monitored during session;Repositioned;Limited activity within patient's tolerance     Hand Dominance     Extremity/Trunk Assessment Upper Extremity Assessment Upper Extremity Assessment: Overall WFL for tasks assessed   Lower Extremity Assessment Lower Extremity Assessment: Defer to PT evaluation       Communication Communication Communication: No difficulties   Cognition Arousal/Alertness: Awake/alert Behavior During Therapy: WFL for tasks assessed/performed Overall Cognitive Status: Within Functional Limits for tasks assessed                     General Comments       Exercises       Shoulder Instructions      Home Living Family/patient expects to be discharged to:: Skilled nursing facility Living Arrangements: Alone                                      Prior Functioning/Environment Level of Independence: Independent             OT Diagnosis: Acute pain   OT Problem List: Impaired balance (sitting  and/or standing);Decreased activity tolerance;Decreased knowledge of use of DME or AE;Decreased knowledge of precautions;Pain   OT Treatment/Interventions: Self-care/ADL training;Therapeutic activities;Patient/family education;Balance training;DME and/or AE instruction    OT Goals(Current goals can be found in the care plan section) Acute Rehab OT Goals Patient Stated Goal: not stated OT Goal Formulation: With patient Time For Goal Achievement: 10/13/15 Potential to Achieve Goals: Good ADL Goals Pt Will Perform Lower Body Dressing: with set-up;sit to/from stand Pt Will Transfer to Toilet: with supervision;ambulating Pt Will Perform  Toileting - Clothing Manipulation and hygiene: with supervision;sit to/from stand Additional ADL Goal #1: Pt will independently verbalize 3/3 back precautions and maintain during session.  OT Frequency: Min 2X/week   Barriers to D/C:            Co-evaluation              End of Session Equipment Utilized During Treatment: Back brace  Activity Tolerance: Patient limited by fatigue;Patient limited by pain Patient left: in bed;with call bell/phone within reach   Time: 5374-8270 OT Time Calculation (min): 13 min Charges:  OT General Charges $OT Visit: 1 Procedure OT Evaluation $OT Eval Moderate Complexity: 1 Procedure G-Codes: OT G-codes **NOT FOR INPATIENT CLASS** Functional Assessment Tool Used: clinical judgment Functional Limitation: Self care Self Care Current Status (B8675): At least 20 percent but less than 40 percent impaired, limited or restricted Self Care Goal Status (Q4920): At least 1 percent but less than 20 percent impaired, limited or restricted  Benito Mccreedy OTR/L [phone] 10/06/2015, 10:16 AM

## 2015-10-06 NOTE — Progress Notes (Signed)
Filed Vitals:   10/05/15 1606 10/05/15 2000 10/05/15 2322 10/06/15 0419  BP: 107/45 104/49 114/47 102/47  Pulse: 66 88 94 91  Temp: 98.2 F (36.8 C) 98.5 F (36.9 C) 99.8 F (37.7 C) 98.6 F (37 C)  TempSrc:  Oral Oral Oral  Resp: 18 16 16 18   SpO2: 99% 100% 100% 96%    Patient up and ambulating in the halls with the physical therapy staff. To be seen also by OT, and social work. Fairly comfortable, describes a little bit of cramping in the legs and back.  Plan: Continue PT, await OT, await social work. Continue to progress through postoperative recovery. Encouraged to ambulate.  Hewitt ShortsNUDELMAN,ROBERT W, MD 10/06/2015, 8:07 AM

## 2015-10-06 NOTE — Progress Notes (Signed)
Physical Therapy Evaluation   10/06/15 0908  PT Visit Information  Last PT Received On 10/06/15  Assistance Needed +1  History of Present Illness Pt is a 70 y/o female who presents s/p L4-L5 PLIF on 10/05/15.  Precautions  Precautions Fall;Back  Precaution Booklet Issued Yes (comment)  Precaution Comments Reviewed handout and pt was cued for precautions during functional mobility.   Required Braces or Orthoses Spinal Brace  Spinal Brace Lumbar corset;Applied in sitting position  Restrictions  Weight Bearing Restrictions No  Home Living  Family/patient expects to be discharged to: Skilled nursing facility  Living Arrangements Alone  Prior Function  Level of Independence Independent  Communication  Communication No difficulties  Pain Assessment  Pain Assessment Faces  Faces Pain Scale 4  Pain Location Back and LE's  Pain Descriptors / Indicators Operative site guarding;Aching  Pain Intervention(s) Limited activity within patient's tolerance;Monitored during session;Repositioned  Cognition  Arousal/Alertness Awake/alert  Behavior [facility] for tasks assessed/performed  Overall Cognitive Status Within Functional Limits for tasks assessed  Upper Extremity Assessment  Upper Extremity Assessment Defer to OT evaluation  Lower Extremity Assessment  Lower Extremity Assessment Generalized weakness  Cervical / Trunk Assessment  Cervical / Trunk Assessment Normal  Bed Mobility  Overal bed mobility Needs Assistance  Bed Mobility Rolling;Sidelying to Sit  Rolling Supervision  Sidelying to sit Min guard  General bed mobility comments Hands-on guarding as pt elevated trunk to full sitting position.   Transfers  Overall transfer level Needs assistance  Equipment used None  Transfers Sit to/from Stand  Sit to Stand Min guard  General transfer comment Hands-on guarding for balance support as pt powered-up to full standing position. Pt declined use of RW.   Ambulation/Gait   Ambulation/Gait assistance Min guard  Ambulation Distance (Feet) 200 Feet  Assistive device None  Gait Pattern/deviations Step-through pattern;Decreased stride length;Trunk flexed  General Gait Details Generally slow and guarded gait. Hands-on support was provided at all times due to appearing unsteady at times.   Gait velocity Decreased  Gait velocity interpretation Below normal speed for age/gender  Balance  Overall balance assessment Needs assistance  Sitting-balance support Feet supported;No upper extremity supported  Sitting balance-Leahy Scale Fair  Standing balance support No upper extremity supported  Standing balance-Leahy Scale Fair  Standing balance comment Appears unsteady at times. Hands-on guarding provided for safety.   PT - End of Session  Equipment Utilized During Treatment Gait belt  Activity Tolerance Patient tolerated treatment well  Patient left in chair;with call bell/phone within reach  Nurse Communication Mobility status  PT Assessment  PT Therapy Diagnosis  Difficulty walking;Acute pain  PT Recommendation/Assessment Patient needs continued PT services  PT Problem List Decreased strength;Decreased range of motion;Decreased activity tolerance;Decreased mobility;Decreased cognition  PT Plan  PT Frequency (ACUTE ONLY) Min 5X/week  PT Treatment/Interventions (ACUTE ONLY) DME instruction;Gait training;Stair training;Functional mobility training;Therapeutic activities;Therapeutic exercise;Neuromuscular re-education;Patient/family education  PT Recommendation  Follow Up Recommendations SNF  PT equipment Rolling walker with 5" wheels;3in1 (PT)  Individuals Consulted  Consulted and Agree with Results and Recommendations Patient  Acute Rehab PT Goals  Patient Stated Goal Return home after stay at SNF  PT Goal Formulation With patient  Time For Goal Achievement 10/13/15  Potential to Achieve Goals Good  PT Time Calculation  PT Start Time (ACUTE ONLY) 0750  PT Stop  Time (ACUTE ONLY) 0810  PT Time Calculation (min) (ACUTE ONLY) 20 min  PT General Charges  $$ ACUTE PT VISIT 1 Procedure  PT Evaluation  $  PT Eval Moderate Complexity 1 Procedure   Assessment: Pt admitted with above diagnosis. Pt currently with functional limitations due to the deficits listed below (see PT Problem List). At the time of PT eval pt was able to perform transfers and ambulation with min guard assist for safety. Pt reports that she has planned with MD and facility for d/c to SNF instead of home. Pt will benefit from skilled PT to increase their independence and safety with mobility to allow discharge to the venue listed below.     Conni Slipper, PT, DPT Acute Rehabilitation Services Pager: [phone]

## 2015-10-07 MED ORDER — HYDROCODONE-ACETAMINOPHEN 5-325 MG PO TABS: 1.0000 | ORAL_TABLET | ORAL | Status: DC | PRN

## 2015-10-07 NOTE — Progress Notes (Signed)
Patient will DC to: [facility] Anticipated DC date: 10/07/15 Family notified: N/A Transport by: PT[facility] 1pm  CSW signing off.  Cristobal GoldmannNadia Eleora Sutherland, ConnecticutLCSWA Clinical Social Worker [phone]

## 2015-10-07 NOTE — Progress Notes (Signed)
Pt D/C'd to SNF via ambulance. Report called to SNF. Incision is open to air and is clean and dry with no sign of infection. Pt's IV was removed prior to D/C. Pt is stable @ D/C. Rema FendtAshley Leiya Keesey, RN

## 2015-10-07 NOTE — Progress Notes (Signed)
Physical Therapy Treatment Patient Details Name: Brittany PainBarbara S Landa MRN: [id number] DOB: [birth year] Today's Date: 10/07/2015    History of Present Illness 70 y.o. s/p Lumbar four-five Decompression/posterior lumbar interbody fusion/posterolateral arthrodesis. PMH includes depression, anxiety, arthritis, hypercholesteremia.    PT Comments    Pt progressing towards physical therapy goals. Pt's main complaint at this time is L sided cramping in the hamstrings/glute. Occasional min assist is required for balance support and safety during gait training, however pt declined use of RW or SPC. Will continue to follow and progress as able per POC.   Follow Up Recommendations  SNF     Equipment Recommendations  Rolling walker with 5" wheels;3in1 (PT)    Recommendations for Other Services       Precautions / Restrictions Precautions Precautions: Back;Fall Precaution Booklet Issued: Yes (comment) Precaution Comments: Pt was cued for back precautions during functional mobility.  Required Braces or Orthoses: Spinal Brace Spinal Brace: Lumbar corset;Applied in standing position Restrictions Weight Bearing Restrictions: No    Mobility  Bed Mobility Overal bed mobility: Needs Assistance Bed Mobility: Rolling;Sidelying to Sit Rolling: Supervision Sidelying to sit: Supervision       General bed mobility comments: VC's for proper log roll technique. Pt able to complete well but with use of rails for support.   Transfers Overall transfer level: Needs assistance Equipment used: None Transfers: Sit to/from Stand Sit to Stand: Supervision         General transfer comment: Close supervision for safety as pt powered-up to full standing position.  Ambulation/Gait Ambulation/Gait assistance: Min assist Ambulation Distance (Feet): 400 Feet Assistive device: None Gait Pattern/deviations: Step-through pattern;Decreased stride length;Drifts right/left Gait velocity: Decreased Gait velocity  interpretation: Below normal speed for age/gender General Gait Details: Pt occasionally reaching for railings in hall for support. Declines use of RW or SPC for support. Pt complaining of cramping sensation in L hamstrings/glutes. Occasional unsteadiness noted with need for light min assist to steady.    Stairs            Wheelchair Mobility    Modified Rankin (Stroke Patients Only)       Balance Overall balance assessment: Needs assistance Sitting-balance support: Feet supported;No upper extremity supported Sitting balance-Leahy Scale: Fair     Standing balance support: No upper extremity supported;During functional activity Standing balance-Leahy Scale: Fair Standing balance comment: Appears unsteady at times. Hands-on guarding provided for safety.                     Cognition Arousal/Alertness: Awake/alert Behavior During Therapy: WFL for tasks assessed/performed Overall Cognitive Status: Within Functional Limits for tasks assessed                      Exercises      General Comments        Pertinent Vitals/[name] [name] Assessment: Faces Faces [name] Scale: Hurts little more [name] Location: LLE cramping in hamstrings/glute [name] Descriptors / Indicators: Cramping [name] Intervention(s): Monitored during session;Limited activity within patient's tolerance;Repositioned    Home Living                      Prior Function            PT Goals (current goals can now be found in the care plan section) Acute Rehab PT Goals Patient Stated Goal: not stated PT Goal Formulation: With patient Time For Goal Achievement: 10/13/15 Potential to Achieve Goals: Good Progress towards PT goals: Progressing toward  goals    Frequency  Min 5X/week    PT Plan Current plan remains appropriate    Co-evaluation             End of Session Equipment Utilized During Treatment: Gait belt Activity Tolerance: Patient tolerated treatment well Patient  left: in chair;with call bell/phone within reach     Time: 0800-0813 PT Time Calculation (min) (ACUTE ONLY): 13 min  Charges:  $Gait Training: 8-22 mins                    G Codes:      Conni Slipper 10/20/15, 9:09 AM  Conni Slipper, PT, DPT Acute Rehabilitation Services Pager: [phone]

## 2015-10-07 NOTE — Discharge Summary (Signed)
Physician Discharge Summary  Patient ID: Brittany [name] MRN: [id number] DOB/AGE: 1946-03-22 70 y.o.  Admit date: 10/05/2015 Discharge date: 10/07/2015  Admission Diagnoses:  L4-5 lumbar stenosis with neurogenic claudication, L4-5 grade 1 degenerative spondylolisthesis, lumbar spondylosis, lumbar degenerative disease, lumbago, left lumbar radiculopathy  Discharge Diagnoses:  L4-5 lumbar stenosis with neurogenic claudication, L4-5 grade 1 degenerative spondylolisthesis, lumbar spondylosis, lumbar degenerative disease, lumbago, left lumbar radiculopathy Active Problems:   Lumbar stenosis with neurogenic claudication   Discharged Condition: good  Hospital Course: Patient was admitted, underwent a bilateral L4-5 lumbar decompression, posterior lumbar interbody arthrodesis, and posterior lateral arthrodesis. Postoperatively she has done well. She is up and ambulating actively in the halls. She's been seen by physical therapy and occupational therapy, [name] postoperative, post discharge rehabilitation. Her dressing has been removed, and is being left open to air. The wound is healing nicely. There is no erythema, ecchymosis, swelling, or drainage. We have recommended she shower daily, standing, allowing the shower water and soap water to run over the incision area, but not washing directly on the incision area. [name]   She is up and ambulating actively in the halls, and she should be ambulating at least 5-6 times per day, increasing distances. She is to avoid bending, lifting, and twisting. She is to donn and doff her brace standing, not sitting or laying in bed. She is not to wear her brace in bed. She should always have a garment underneath the brace, rather than the brace being directly on her skin. She is scheduled for follow-up with me in the office with x-rays in 3 weeks. The glue on her  incision should be removed 2 weeks after surgery, by staff or family member. She is prescribed Norco 5/325 one to 2 every 4 hours when necessary [name], but she can take as little as half a tablet, or just Tylenol for [name].  Discharge Exam: Blood pressure 94/46, pulse 91, temperature 98.6 F (37 C), temperature source Oral, resp. rate 18, SpO2 98 %.  Disposition:   Brittany [name] (skilled nursing facility for postoperative rehabilitation)    Medication List    TAKE these medications        acetaminophen 500 MG tablet  Commonly known as:  TYLENOL  Take 1,000 mg by mouth every 8 (eight) hours as needed for mild [name] or moderate [name].     alendronate 70 MG tablet  Commonly known as:  FOSAMAX  Take 70 mg by mouth once a week. Take with a full glass of water on an empty stomach.     ALPRAZolam 0.25 MG tablet  Commonly known as:  XANAX  Take 0.25-0.5 mg by mouth 2 (two) times daily as needed for anxiety or sleep.     calcium carbonate 750 MG chewable tablet  Commonly known as:  TUMS EX  Chew 1 tablet by mouth daily.     cholecalciferol 1000 units tablet  Commonly known as:  VITAMIN D  Take 1,000 Units by mouth daily.     citalopram 40 MG tablet  Commonly known as:  CELEXA  Take 40 mg by mouth daily.     docusate sodium 100 MG capsule  Commonly known as:  COLACE  Take 100 mg by mouth daily.     GLUCOSAMINE CHONDROITIN TRIPLE PO  Take 1 tablet by mouth daily.     HYDROcodone-acetaminophen 5-325 MG tablet  Commonly known as:  NORCO/VICODIN  Take 1-2 tablets by mouth every 4 (four) hours as needed (mild [name]).     loratadine 10 MG tablet  Commonly known as:  CLARITIN  Take 10 mg by mouth daily.     meloxicam 15 MG tablet  Commonly known as:  MOBIC  Take 15 mg by mouth daily.     simvastatin 20 MG tablet  Commonly known as:  ZOCOR  Take 20 mg by mouth daily.     tiZANidine 4 MG capsule  Commonly known as:  ZANAFLEX  Take 8 mg by mouth at bedtime.     traMADol 50 MG  tablet  Commonly known as:  ULTRAM  Take 100 mg by mouth 2 (two) times daily as needed for moderate [name].     vitamin B-12 500 MCG tablet  Commonly known as:  CYANOCOBALAMIN  Take 500 mcg by mouth daily.         SignedHewitt Shorts 10/07/2015, 8:50 AM

## 2015-10-08 ENCOUNTER — Encounter: Payer: Self-pay | Admitting: Adult Health

## 2015-10-08 ENCOUNTER — Non-Acute Institutional Stay (SKILLED_NURSING_FACILITY): Payer: Medicare Other | Admitting: Adult Health

## 2015-10-08 DIAGNOSIS — M810 Age-related osteoporosis without current pathological fracture: Secondary | ICD-10-CM

## 2015-10-08 DIAGNOSIS — F32A Depression, unspecified: Secondary | ICD-10-CM

## 2015-10-08 DIAGNOSIS — E559 Vitamin D deficiency, unspecified: Secondary | ICD-10-CM

## 2015-10-08 DIAGNOSIS — F329 Major depressive disorder, single episode, unspecified: Secondary | ICD-10-CM

## 2015-10-08 DIAGNOSIS — F419 Anxiety disorder, unspecified: Secondary | ICD-10-CM | POA: Diagnosis not present

## 2015-10-08 DIAGNOSIS — M4806 Spinal stenosis, lumbar region: Secondary | ICD-10-CM

## 2015-10-08 DIAGNOSIS — E785 Hyperlipidemia, unspecified: Secondary | ICD-10-CM

## 2015-10-08 DIAGNOSIS — J302 Other seasonal allergic rhinitis: Secondary | ICD-10-CM

## 2015-10-08 DIAGNOSIS — K5901 Slow transit constipation: Secondary | ICD-10-CM | POA: Diagnosis not present

## 2015-10-08 DIAGNOSIS — M48062 Spinal stenosis, lumbar region with neurogenic claudication: Secondary | ICD-10-CM

## 2015-10-08 NOTE — Progress Notes (Signed)
Patient ID: Brittany [name], female   DOB: [birth year], 70 y.o.   MRN: [id number]    DATE:  10/08/2015   MRN:  [id number]  BIRTHDAY: 1945/11/12  Facility:  Nursing Home Location:  [facility]  Nursing Home Room Number: 1204-P  LEVEL OF CARE:  SNF [phone])  Contact Information    Name Relation Home Work Mobile   Leal Sister  [phone]       Code Status History    Date Active Date Inactive Code Status Order ID Comments User Context   10/05/2015 12:02 PM 10/07/2015  8:50 PM Full Code [id number]  Shirlean Kelly, MD Inpatient       Chief Complaint  Patient presents with  . Hospitalization Follow-up    HISTORY OF PRESENT ILLNESS:  This is a 70 year old female who has been admitted to [facility] on 10/07/15 from [facility]. She has PMH of Depression, Anxiety, Arthritis and Hypercholesterolemia. She has Lumbar stenosis with neurogenic claudication for which he had decompression 0n 10/05/15.  She complained of spasm on her left leg.   She has been admitted for a short-term rehabilitation.   Depression   . Anxiety   . Arthritis   . Hypercholesteremia           PAST MEDICAL HISTORY:  Past Medical History  Diagnosis Date  . Depression   . Anxiety   . Arthritis   . Hypercholesteremia   . Lumbar stenosis with neurogenic claudication     L4-5  . Degenerative spondylolisthesis     L4-5, grade 1  . Lumbar spondylosis   . Lumbar degenerative disc disease   . Lumbago   . Left lumbar radiculopathy      CURRENT MEDICATIONS: Reviewed  Patient's Medications  New Prescriptions   No medications on file  Previous Medications   ACETAMINOPHEN (TYLENOL) 500 MG TABLET    Take 1,000 mg by mouth every 8 (eight) hours as needed for mild pain or moderate pain.   ALENDRONATE (FOSAMAX) 70 MG TABLET    Take 70 mg by mouth once a week. Take with a full glass of water on an empty stomach.   ALPRAZOLAM (XANAX) 0.25 MG TABLET     Take 0.25-0.5 mg by mouth 2 (two) times daily as needed for anxiety or sleep.   CALCIUM CARBONATE (TUMS EX) 750 MG CHEWABLE TABLET    Chew 1 tablet by mouth daily.   CHOLECALCIFEROL (VITAMIN D-3 PO)    Take 1,000 Units by mouth daily.   CITALOPRAM (CELEXA) 40 MG TABLET    Take 40 mg by mouth daily.   DOCUSATE SODIUM (COLACE) 100 MG CAPSULE    Take 100 mg by mouth daily.   HYDROCODONE-ACETAMINOPHEN (NORCO/VICODIN) 5-325 MG TABLET    Take 1-2 tablets by mouth every 4 (four) hours as needed (mild pain).   LORATADINE (CLARITIN) 10 MG TABLET    Take 10 mg by mouth daily.   MELOXICAM (MOBIC) 15 MG TABLET    Take 15 mg by mouth daily.   MISC NATURAL PRODUCTS (GLUCOSAMINE CHONDROITIN MSM) TABS    Take 1 tablet by mouth daily.   SIMVASTATIN (ZOCOR) 20 MG TABLET    Take 20 mg by mouth daily.   TIZANIDINE (ZANAFLEX) 4 MG CAPSULE    Take 4 mg by mouth at bedtime.    TRAMADOL (ULTRAM) 50 MG TABLET    Take 100 mg by mouth 2 (two) times daily as needed for moderate pain.   VITAMIN B-12 (CYANOCOBALAMIN)  500 MCG TABLET    Take 500 mcg by mouth daily.  Modified Medications   No medications on file  Discontinued Medications   CHOLECALCIFEROL (VITAMIN D) 1000 UNITS TABLET    Take 1,000 Units by mouth daily.   MISC NATURAL PRODUCTS (GLUCOSAMINE CHONDROITIN TRIPLE PO)    Take 1 tablet by mouth daily.     Allergies  Allergen Reactions  . Penicillins     Has patient had a PCN reaction causing immediate rash, facial/tongue/throat swelling, SOB or lightheadedness with hypotension: No No Has patient had a PCN reaction causing severe rash involving mucus membranes or skin necrosis: No NO Has patient had a PCN reaction that required hospitalization No No Has patient had a PCN reaction occurring within the last 10 years: NoNo If all of the above answers are "NO", then may proceed with Cephalosporin   . Tetracyclines & Related Other (See Comments)    Throat ulcer     REVIEW OF SYSTEMS:  GENERAL: no change in  appetite, no fatigue, no weight changes, no fever, chills or weakness EYES: Denies change in vision, dry eyes, eye pain, itching or discharge EARS: Denies change in hearing, ringing in ears, or earache NOSE: Denies nasal congestion or epistaxis MOUTH and THROAT: Denies oral discomfort, gingival pain or bleeding, pain from teeth or hoarseness   RESPIRATORY: no cough, SOB, DOE, wheezing, hemoptysis CARDIAC: no chest pain, edema or palpitations GI: no abdominal pain, diarrhea, heart burn, nausea or vomiting, +constipation GU: Denies dysuria, frequency, hematuria, incontinence, or discharge PSYCHIATRIC: Denies feeling of depression or anxiety. No report of hallucinations, insomnia, paranoia, or agitation   PHYSICAL EXAMINATION  GENERAL APPEARANCE: Well nourished. In no acute distress. Normal body habitus SKIN:  Midline lower lumbar incision is dry, no redness HEAD: Normal in size and contour. No evidence of trauma EYES: Lids open and close normally. No blepharitis, entropion or ectropion. PERRL. Conjunctivae are clear and sclerae are white. Lenses are without opacity EARS: Pinnae are normal. Patient hears normal voice tunes of the examiner MOUTH and THROAT: Lips are without lesions. Oral mucosa is moist and without lesions. Tongue is normal in shape, size, and color and without lesions NECK: supple, trachea midline, no neck masses, no thyroid tenderness, no thyromegaly LYMPHATICS: no LAN in the neck, no supraclavicular LAN RESPIRATORY: breathing is even & unlabored, BS CTAB CARDIAC: RRR, no murmur,no extra heart sounds, no edema GI: abdomen soft, normal BS, no masses, no tenderness, no hepatomegaly, no splenomegaly EXTREMITIES:  Able to move X 4 extremities PSYCHIATRIC: Alert and oriented X 3. Affect and behavior are appropriate  LABS/RADIOLOGY: Labs reviewed: Basic Metabolic Panel:  Recent Labs  16/10/96 1043  NA 139  K 4.6  CL 103  CO2 26  GLUCOSE 84  BUN 12  CREATININE 0.93   CALCIUM 9.8   CBC:  Recent Labs  09/28/15 1043  WBC 5.1  HGB 13.1  HCT 40.0  MCV 92.6  PLT 202     Dg Lumbar Spine 2-3 Views  10/05/2015  CLINICAL DATA:  Posterior fusion. EXAM: DG C-ARM 61-120 MIN; LUMBAR SPINE - 2-3 VIEW COMPARISON:  10/05/2015. FINDINGS: Lumbar spine numbered as per prior exam. L4-L5 posterior fusion screws noted. L4-L5 interbody fusion. Stable mild anterolisthesis L4 on L5 . Mild stable anterolisthesis. Three images obtained. 0 minutes 49 seconds fluoroscopy time. IMPRESSION: Postsurgical changes lumbar spine as above. Electronically Signed   By: Maisie Fus  Register   On: 10/05/2015 11:49   Dg Lumbar Spine 2-3 Views  10/05/2015  CLINICAL DATA:  Posterior fusion. EXAM: LUMBAR SPINE - 2-3 VIEW COMPARISON:  None. FINDINGS: Metallic marker noted posteriorly at the L4-L5 disc space. Diffuse degenerative change. No acute bony abnormality. IMPRESSION: Metallic marker noted posteriorly at the L4-L5 disc space. Electronically Signed   By: Maisie Fushomas  Register   On: 10/05/2015 11:22   Mr Cervical Spine Wo Contrast  09/13/2015  CLINICAL DATA:  Cervical spondylosis with radiculopathy. Left shoulder pain since mid January. No known injury. EXAM: MRI CERVICAL SPINE WITHOUT CONTRAST TECHNIQUE: Multiplanar, multisequence MR imaging of the cervical spine was performed. No intravenous contrast was administered. COMPARISON:  None. FINDINGS: No marrow signal abnormality suggestive of fracture, infection, or neoplasm. Normal cord signal and morphology. Mucosal thickening and central proteinaceous contents. Presumed small vessel ischemic change in the pons. Probable goiter, but partially visualized. Craniocervical junction: Negative. C2-3: Disc: Small central protrusion without neural contact Facets: Minor left facet spurring Canal: Patent. Foramina: No impingement. C3-4: Disc: Tiny central protrusion, most convincing on gradient acquisition. Facets: Negative. Canal: Patent. Foramina: No impingement.  C4-5: Disc: Bulging with central protrusion contacting the ventral cord. Facets: Advanced arthropathy on the left where there is articular process distortion and a 5 mm synovial cyst deforming the left lateral cord. Anterolisthesis which has been recently evaluated with flexion extension radiography; currently at 3 mm. Canal: Overall mild stenosis with mild deformation of the left hemicord and ventral cord from synovial cyst and protrusion as described above. Foramina: Probable impingement at the entry to the left foramen from synovial cyst. C5-6: Disc: Advanced degeneration with narrowing and endplate irregularity. Endplate and right uncovertebral spurring. Facets: Mild arthropathic change Canal: Mild narrowing. Foramina: Moderate stenosis on the right. Early stenosis on the left. C6-7: Disc: Advanced narrowing with endplate irregularity and posterior disc osteophyte complex. Bilateral uncovertebral spurring. Facets: Negative. Canal: Mild narrowing without cord affect from disc osteophyte complex and posterior ligamentous thickening. Foramina: Mild bilateral stenosis. C7-T1: Disc: No herniation. Facets: Arthropathy with right more than left spurring Canal: Patent. Foramina: No impingement. IMPRESSION: 1. C4-5 advanced left facet arthropathy with anterolisthesis and 5mm synovial cyst affecting the left cord and exiting C5 nerve. 2. C4-5 to C6-7 predominant degenerative disc disease with mild canal stenosis. Electronically Signed   By: Marnee SpringJonathon  Watts M.D.   On: 09/13/2015 17:02   Dg C-arm 1-60 Min  10/05/2015  CLINICAL DATA:  Posterior fusion. EXAM: DG C-ARM 61-120 MIN; LUMBAR SPINE - 2-3 VIEW COMPARISON:  10/05/2015. FINDINGS: Lumbar spine numbered as per prior exam. L4-L5 posterior fusion screws noted. L4-L5 interbody fusion. Stable mild anterolisthesis L4 on L5 . Mild stable anterolisthesis. Three images obtained. 0 minutes 49 seconds fluoroscopy time. IMPRESSION: Postsurgical changes lumbar spine as above.  Electronically Signed   By: Maisie Fushomas  Register   On: 10/05/2015 11:49    ASSESSMENT/PLAN:  Lumbar stenosis with neurogenic claudication S/P decompression - for rehabilitation; continue NOrco 5/325 mg 1-2 tabs PO Q 4 hours PRN, Mobic 15 mg 1 tab by mouth daily, tramadol 50 mg 2 tabs = 100 mg by mouth twice a day when necessary and Tylenol 500 mg 2 tabs = 1,000 mg PO Q 8 hours PRN for pain; tizanidine 4 mg 1 tab by mouth daily daily at bedtime and add tizanidine 4 mg 1 tab by mouth twice a day when necessary for muscle spasm; follow-up with Dr. Newell CoralNudelman, neurosurgeon; check CBC and CMP  Vitamin D deficiency  - Continue vitamin D3 1000 units 1 tab by mouth daily   Depression - mood is stable;  continue cytology problem 40 mg 1 tab by mouth daily  Constipation -  discontinue Colace, start senna S2 tabs by mouth twice a day, Dulcolax 10 mg 1 suppository per rectum daily PRN and MiraLAX 17 g by mouth twice a day  Seasonal allergy - continue loratadine 10 mg 1 tab by mouth daily  Hyperlipidemia -  continue simvastatin 20 mg 1 tab by mouth daily; check lipid panel  Osteoporosis - continue Fosamax 70 mg 1 tab by mouth every week and glucosamine chondroitin MSM 1 tab by mouth daily  Anxiety - mood is stable; continue alprazolam 0.25 mg 1-2 tabs PO BID PRN     Goals of care:  Short-term rehabilitation     [place], NP [facility] [phone]

## 2015-10-09 ENCOUNTER — Non-Acute Institutional Stay (SKILLED_NURSING_FACILITY): Payer: Medicare Other | Admitting: Internal Medicine

## 2015-10-09 ENCOUNTER — Encounter: Payer: Self-pay | Admitting: Internal Medicine

## 2015-10-09 DIAGNOSIS — M48062 Spinal stenosis, lumbar region with neurogenic claudication: Secondary | ICD-10-CM

## 2015-10-09 DIAGNOSIS — R0982 Postnasal drip: Secondary | ICD-10-CM

## 2015-10-09 DIAGNOSIS — E785 Hyperlipidemia, unspecified: Secondary | ICD-10-CM | POA: Diagnosis not present

## 2015-10-09 DIAGNOSIS — F419 Anxiety disorder, unspecified: Secondary | ICD-10-CM

## 2015-10-09 DIAGNOSIS — M4806 Spinal stenosis, lumbar region: Secondary | ICD-10-CM

## 2015-10-09 DIAGNOSIS — E559 Vitamin D deficiency, unspecified: Secondary | ICD-10-CM | POA: Diagnosis not present

## 2015-10-09 DIAGNOSIS — K5901 Slow transit constipation: Secondary | ICD-10-CM

## 2015-10-09 DIAGNOSIS — M810 Age-related osteoporosis without current pathological fracture: Secondary | ICD-10-CM

## 2015-10-09 DIAGNOSIS — R29898 Other symptoms and signs involving the musculoskeletal system: Secondary | ICD-10-CM

## 2015-10-09 DIAGNOSIS — F32A Depression, unspecified: Secondary | ICD-10-CM

## 2015-10-09 DIAGNOSIS — F329 Major depressive disorder, single episode, unspecified: Secondary | ICD-10-CM | POA: Diagnosis not present

## 2015-10-09 LAB — HEPATIC FUNCTION PANEL
ALK PHOS: 109 U/L (ref 25–125)
ALT: 47 U/L — AB (ref 7–35)
AST: 83 U/L — AB (ref 13–35)
BILIRUBIN, TOTAL: 0.5 mg/dL

## 2015-10-09 LAB — CBC AND DIFFERENTIAL
HEMATOCRIT: 28 % — AB (ref 36–46)
HEMOGLOBIN: 8.8 g/dL — AB (ref 12.0–16.0)
PLATELETS: 196 10*3/uL (ref 150–399)
WBC: 5.7 10^3/mL

## 2015-10-09 LAB — BASIC METABOLIC PANEL
BUN: 16 mg/dL (ref 4–21)
Creatinine: 0.9 mg/dL (ref 0.5–1.1)
GLUCOSE: 136 mg/dL
Potassium: 4.5 mmol/L (ref 3.4–5.3)
Sodium: 138 mmol/L (ref 137–147)

## 2015-10-09 LAB — LIPID PANEL
Cholesterol: 118 mg/dL (ref 0–200)
HDL: 28 mg/dL — AB (ref 35–70)
LDL CALC: 68 mg/dL
Triglycerides: 110 mg/dL (ref 40–160)

## 2015-10-09 NOTE — Progress Notes (Signed)
LOCATION: [facility]  PCP: Lupita Raider, MD   Code Status: Full Code  Goals of care: Advanced Directive information Advanced Directives 10/05/2015  Does patient have an advance directive? No  Would patient like information on creating an advanced directive? No - patient declined information       Extended Emergency Contact Information Primary Emergency Contact: Lucas Mallow [place] Work Phone: [phone] Mobile Phone: [phone] Relation: Sister   Allergies  Allergen Reactions  . Penicillins     Has patient had a PCN reaction causing immediate rash, facial/tongue/throat swelling, SOB or lightheadedness with hypotension: No No Has patient had a PCN reaction causing severe rash involving mucus membranes or skin necrosis: No NO Has patient had a PCN reaction that required hospitalization No No Has patient had a PCN reaction occurring within the last 10 years: NoNo If all of the above answers are "NO", then may proceed with Cephalosporin   . Tetracyclines & Related Other (See Comments)    Throat ulcer    Chief Complaint  Patient presents with  . New Admit To SNF    New Admission     HPI:  Patient is a 70 y.o. female seen today for short term rehabilitation post hospital admission from 10/05/15-10/07/15 with L4-5 lumbar stenosis with neurogenic claudication. She underwent decompression with arthrodesis on 10/05/15. She is seen in her room today.   Review of Systems:  Constitutional: Negative for fever, chills, diaphoresis.  HENT: Negative for headache, congestion, sore throat, difficulty swallowing. Positive for post nasal drip. Eyes: Negative for blurred vision, double vision and discharge.  Respiratory: Negative for cough, shortness of breath and wheezing.   Cardiovascular: Negative for chest pain, palpitations, leg swelling.  Gastrointestinal: Negative for heartburn, nausea, vomiting, abdominal pain, loss of appetite, melena, diarrhea and  constipation. Last bowel movement was today. Genitourinary: Negative for dysuria and flank pain.  Musculoskeletal: Negative for back pain, fall in the facility.  Skin: Negative for itching, rash.  Neurological: Negative for weakness and dizziness. Psychiatric/Behavioral: Negative for depression.    Past Medical History  Diagnosis Date  . Depression   . Anxiety   . Arthritis   . Hypercholesteremia   . Lumbar stenosis with neurogenic claudication     L4-5  . Degenerative spondylolisthesis     L4-5, grade 1  . Lumbar spondylosis   . Lumbar degenerative disc disease   . Lumbago   . Left lumbar radiculopathy    Past Surgical History  Procedure Laterality Date  . Tonsillectomy    . Eye surgery     Social History:   reports that she has never smoked. She does not have any smokeless tobacco history on file. She reports that she does not drink alcohol or use illicit drugs.  No family history on file.  Medications:   Medication List       This list is accurate as of: 10/09/15  3:03 PM.  Always use your most recent med list.               acetaminophen 500 MG tablet  Commonly known as:  TYLENOL  Take 1,000 mg by mouth every 8 (eight) hours as needed for mild pain or moderate pain.     alendronate 70 MG tablet  Commonly known as:  FOSAMAX  Take 70 mg by mouth once a week. Take with a full glass of water on an empty stomach.     ALPRAZolam 0.25 MG tablet  Commonly known as:  Prudy Feeler  Take 0.25-0.5 mg by mouth 2 (two) times daily as needed for anxiety or sleep.     bisacodyl 10 MG suppository  Commonly known as:  DULCOLAX  Place 10 mg rectally daily as needed for moderate constipation.     calcium carbonate 750 MG chewable tablet  Commonly known as:  TUMS EX  Chew 1 tablet by mouth daily.     citalopram 40 MG tablet  Commonly known as:  CELEXA  Take 40 mg by mouth daily.     Glucosamine Chondroitin MSM Tabs  Take 1 tablet by mouth daily.      HYDROcodone-acetaminophen 5-325 MG tablet  Commonly known as:  NORCO/VICODIN  Take 1-2 tablets by mouth every 4 (four) hours as needed (mild pain).     loratadine 10 MG tablet  Commonly known as:  CLARITIN  Take 10 mg by mouth daily.     meloxicam 15 MG tablet  Commonly known as:  MOBIC  Take 15 mg by mouth daily.     polyethylene glycol packet  Commonly known as:  MIRALAX / GLYCOLAX  Take 17 g by mouth 2 (two) times daily.     senna 8.6 MG tablet  Commonly known as:  SENOKOT  Take 2 tablets by mouth 2 (two) times daily.     simvastatin 20 MG tablet  Commonly known as:  ZOCOR  Take 20 mg by mouth daily.     tiZANidine 4 MG tablet  Commonly known as:  ZANAFLEX  Take 4 mg by mouth at bedtime. Take 2 tablets= 8 mg     tiZANidine 4 MG capsule  Commonly known as:  ZANAFLEX  Take 4 mg by mouth 2 (two) times daily as needed.     traMADol 50 MG tablet  Commonly known as:  ULTRAM  Take 100 mg by mouth 2 (two) times daily as needed for moderate pain.     vitamin B-12 500 MCG tablet  Commonly known as:  CYANOCOBALAMIN  Take 500 mcg by mouth daily.     VITAMIN D-3 PO  Take 1,000 Units by mouth daily.        Immunizations:  There is no immunization history on file for this patient.   Physical Exam: Filed Vitals:   10/09/15 1441  BP: 98/58  Pulse: 87  Temp: 99.3 F (37.4 C)  TempSrc: Oral  Resp: 16  Height: 5\' 3"  (1.6 m)  Weight: 140 lb (63.504 kg)  SpO2: 92%   Body mass index is 24.81 kg/(m^2).  General- elderly female, well built, in no acute distress Head- normocephalic, atraumatic Nose- no maxillary or frontal sinus tenderness, no nasal discharge Throat- moist mucus membrane  Eyes- PERRLA, EOMI, no pallor, no icterus, no discharge, normal conjunctiva, normal sclera Neck- no cervical lymphadenopathy Cardiovascular- normal s1,s2, no murmur Respiratory- bilateral clear to auscultation, no wheeze, no rhonchi, no crackles, no use of accessory  muscles Abdomen- bowel sounds present, soft, non tender Musculoskeletal- able to move all 4 extremities, generalized weakness to her legs Neurological- alert and oriented to person, place and time Skin- warm and dry, lumbar incision with glue and healing well Psychiatry- normal mood and affect    Labs reviewed: Basic Metabolic Panel:  Recent Labs  16/10/96 09/28/15 1043  NA 139 139  K  --  4.6  CL  --  103  CO2  --  26  GLUCOSE  --  84  BUN 12 12  CREATININE 0.9 0.93  CALCIUM  --  9.8   Liver  Function Tests: No results for input(s): AST, ALT, ALKPHOS, BILITOT, PROT, ALBUMIN in the last 8760 hours. No results for input(s): LIPASE, AMYLASE in the last 8760 hours. No results for input(s): AMMONIA in the last 8760 hours. CBC:  Recent Labs  09/28/15 09/28/15 1043  WBC 5.1 5.1  HGB  --  13.1  HCT  --  40.0  MCV  --  92.6  PLT  --  202     Radiological Exams: Dg Lumbar Spine 2-3 Views  10/05/2015  CLINICAL DATA:  Posterior fusion. EXAM: DG C-ARM 61-120 MIN; LUMBAR SPINE - 2-3 VIEW COMPARISON:  10/05/2015. FINDINGS: Lumbar spine numbered as per prior exam. L4-L5 posterior fusion screws noted. L4-L5 interbody fusion. Stable mild anterolisthesis L4 on L5 . Mild stable anterolisthesis. Three images obtained. 0 minutes 49 seconds fluoroscopy time. IMPRESSION: Postsurgical changes lumbar spine as above. Electronically Signed   By: Maisie Fushomas  Register   On: 10/05/2015 11:49   Dg Lumbar Spine 2-3 Views  10/05/2015  CLINICAL DATA:  Posterior fusion. EXAM: LUMBAR SPINE - 2-3 VIEW COMPARISON:  None. FINDINGS: Metallic marker noted posteriorly at the L4-L5 disc space. Diffuse degenerative change. No acute bony abnormality. IMPRESSION: Metallic marker noted posteriorly at the L4-L5 disc space. Electronically Signed   By: Maisie Fushomas  Register   On: 10/05/2015 11:22   Mr Cervical Spine Wo Contrast  09/13/2015  CLINICAL DATA:  Cervical spondylosis with radiculopathy. Left shoulder pain since mid  January. No known injury. EXAM: MRI CERVICAL SPINE WITHOUT CONTRAST TECHNIQUE: Multiplanar, multisequence MR imaging of the cervical spine was performed. No intravenous contrast was administered. COMPARISON:  None. FINDINGS: No marrow signal abnormality suggestive of fracture, infection, or neoplasm. Normal cord signal and morphology. Mucosal thickening and central proteinaceous contents. Presumed small vessel ischemic change in the pons. Probable goiter, but partially visualized. Craniocervical junction: Negative. C2-3: Disc: Small central protrusion without neural contact Facets: Minor left facet spurring Canal: Patent. Foramina: No impingement. C3-4: Disc: Tiny central protrusion, most convincing on gradient acquisition. Facets: Negative. Canal: Patent. Foramina: No impingement. C4-5: Disc: Bulging with central protrusion contacting the ventral cord. Facets: Advanced arthropathy on the left where there is articular process distortion and a 5 mm synovial cyst deforming the left lateral cord. Anterolisthesis which has been recently evaluated with flexion extension radiography; currently at 3 mm. Canal: Overall mild stenosis with mild deformation of the left hemicord and ventral cord from synovial cyst and protrusion as described above. Foramina: Probable impingement at the entry to the left foramen from synovial cyst. C5-6: Disc: Advanced degeneration with narrowing and endplate irregularity. Endplate and right uncovertebral spurring. Facets: Mild arthropathic change Canal: Mild narrowing. Foramina: Moderate stenosis on the right. Early stenosis on the left. C6-7: Disc: Advanced narrowing with endplate irregularity and posterior disc osteophyte complex. Bilateral uncovertebral spurring. Facets: Negative. Canal: Mild narrowing without cord affect from disc osteophyte complex and posterior ligamentous thickening. Foramina: Mild bilateral stenosis. C7-T1: Disc: No herniation. Facets: Arthropathy with right more than  left spurring Canal: Patent. Foramina: No impingement. IMPRESSION: 1. C4-5 advanced left facet arthropathy with anterolisthesis and 5mm synovial cyst affecting the left cord and exiting C5 nerve. 2. C4-5 to C6-7 predominant degenerative disc disease with mild canal stenosis. Electronically Signed   By: Marnee SpringJonathon  Watts M.D.   On: 09/13/2015 17:02   Dg C-arm 1-60 Min  10/05/2015  CLINICAL DATA:  Posterior fusion. EXAM: DG C-ARM 61-120 MIN; LUMBAR SPINE - 2-3 VIEW COMPARISON:  10/05/2015. FINDINGS: Lumbar spine numbered as per prior exam. L4-L5  posterior fusion screws noted. L4-L5 interbody fusion. Stable mild anterolisthesis L4 on L5 . Mild stable anterolisthesis. Three images obtained. 0 minutes 49 seconds fluoroscopy time. IMPRESSION: Postsurgical changes lumbar spine as above. Electronically Signed   By: Maisie Fus  Register   On: 10/05/2015 11:49    Assessment/Plan  Bilateral leg weakness Will have her work with physical therapy and occupational therapy team to help with gait training and muscle strengthening exercises.fall precautions. Skin care. Encourage to be out of bed.   Lumbar stenosis with neurogenic claudication  S/P surgical decompression. Continue norco 5-325 mg 1-2 tab q4h prn pain and tramadol 100 mg bid prn. Discontinue prn tylenol order. Continue tizanidine 8 mg qhs and 4 mg bid prn for muscle spams. Back precautions. Has follow up with neurosurgery. Will have patient work with PT/OT as tolerated to regain strength and restore function.  Fall precautions are in place.  Constipation On senna s 2 tab bid and miralax bid. Had been constipated until yesterday and now has soft stools. Change miralax to daily for now and monitor  Vitamin D deficiency Continue vitamin D3 1000 units daily   Osteoporosis Continue weekly fosamax, daily vitamin d supplement  Anxiety disorder Calm this visit. Continue alprazolam 0.25 mg bud prn and monitor  Chronic depression Continue celexa 40 mg  daily  Post nasal drip continue loratadine 10 mg daily  Hyperlipidemia  continue simvastatin 20 mg daily    Goals of care: short term rehabilitation   Labs/tests ordered: cbc, cmp  Family/ staff Communication: reviewed care plan with patient and nursing supervisor    Oneal Grout, MD Internal Medicine [facility] [address] [place], [place] [address] Cell Phone (Monday-Friday 8 am - 5 pm): [phone] On Call: [phone] and follow prompts after 5 pm and on weekends Office Phone: [phone] Office Fax: [phone]

## 2015-10-12 ENCOUNTER — Non-Acute Institutional Stay (SKILLED_NURSING_FACILITY): Payer: Medicare Other | Admitting: Adult Health

## 2015-10-12 ENCOUNTER — Encounter: Payer: Self-pay | Admitting: Adult Health

## 2015-10-12 DIAGNOSIS — D62 Acute posthemorrhagic anemia: Secondary | ICD-10-CM | POA: Diagnosis not present

## 2015-10-12 DIAGNOSIS — R748 Abnormal levels of other serum enzymes: Secondary | ICD-10-CM | POA: Diagnosis not present

## 2015-10-12 DIAGNOSIS — E43 Unspecified severe protein-calorie malnutrition: Secondary | ICD-10-CM

## 2015-10-12 NOTE — Progress Notes (Signed)
Patient ID: Brittany PainBarbara S Yakubov, female   DOB: Aug 09, 1945, 70 y.o.   MRN: [id number]     DATE:  10/12/15  MRN:  [id number]  BIRTHDAY: Aug 09, 1945  Facility:  Nursing Home Location:  [facility]  Nursing Home Room Number: 1204-P  LEVEL OF CARE:  SNF [phone]  Contact Information    Name Relation Home Work Mobile   [place],[name] Sister  [phone]       Code Status History    Date Active Date Inactive Code Status Order ID Comments User Context   10/05/2015 12:02 PM 10/07/2015  8:50 PM Full Code [id number]  Shirlean Kellyobert Nudelman, MD Inpatient       Chief Complaint  Patient presents with  . Acute Visit    Protein-calorie malnutrition, anemia, elevated liver enzymes    HISTORY OF PRESENT ILLNESS:  This is a 70 year old female who was noted to have hgb 8.8. No complaints of dizziness. She has Lumbar stenosis with neurogenic claudication for which he had decompression 0n 10/05/15. Albumin was noted to be low, 2.70 , SGOT 83 (elevated)  and SGPT 47 (elevated). No jaundice noted.    Depression   . Anxiety   . Arthritis   . Hypercholesteremia           PAST MEDICAL HISTORY:  Past Medical History  Diagnosis Date  . Depression   . Anxiety   . Arthritis   . Hypercholesteremia   . Lumbar stenosis with neurogenic claudication     L4-5  . Degenerative spondylolisthesis     L4-5, grade 1  . Lumbar spondylosis   . Lumbar degenerative disc disease   . Lumbago   . Left lumbar radiculopathy   . Weakness of both lower extremities   . Spinal stenosis   . Osteoporosis   . Slow transit constipation   . Post-nasal drip   . HLD (hyperlipidemia)   . Vitamin D deficiency      CURRENT MEDICATIONS: Reviewed  Patient's Medications  New Prescriptions   No medications on file  Previous Medications   ALENDRONATE (FOSAMAX) 70 MG TABLET    Take 70 mg by mouth once a week. Take with a full glass of water on an empty stomach.   ALPRAZOLAM (XANAX) 0.25  MG TABLET    Take 0.25-0.5 mg by mouth 2 (two) times daily as needed for anxiety or sleep.   BISACODYL (DULCOLAX) 10 MG SUPPOSITORY    Place 10 mg rectally daily as needed for moderate constipation.   CALCIUM CARBONATE (TUMS EX) 750 MG CHEWABLE TABLET    Chew 1 tablet by mouth daily.   CHOLECALCIFEROL (VITAMIN D-3 PO)    Take 1,000 Units by mouth daily.   CITALOPRAM (CELEXA) 40 MG TABLET    Take 40 mg by mouth daily.   FERROUS SULFATE 325 (65 FE) MG TABLET    Take 325 mg by mouth 2 (two) times daily with a meal.   HYDROCODONE-ACETAMINOPHEN (NORCO/VICODIN) 5-325 MG TABLET    Take 1-2 tablets by mouth every 4 (four) hours as needed (mild [name]).   LORATADINE (CLARITIN) 10 MG TABLET    Take 10 mg by mouth daily.   MELOXICAM (MOBIC) 15 MG TABLET    Take 15 mg by mouth daily.   MISC NATURAL PRODUCTS (GLUCOSAMINE CHONDROITIN MSM) TABS    Take 1 tablet by mouth daily.   POLYETHYLENE GLYCOL (MIRALAX / GLYCOLAX) PACKET    Take 17 g by mouth daily.    PROTEIN (PROCEL) POWD  Take 2 scoop by mouth 2 (two) times daily.   SENNOSIDES-DOCUSATE SODIUM (SENOKOT-S) 8.6-50 MG TABLET    Take 2 tablets by mouth 2 (two) times daily.   SIMVASTATIN (ZOCOR) 20 MG TABLET    Take 20 mg by mouth daily.   TIZANIDINE (ZANAFLEX) 4 MG CAPSULE    Take 4 mg by mouth 2 (two) times daily as needed.    TIZANIDINE (ZANAFLEX) 4 MG TABLET    Take 8 mg by mouth at bedtime. Take 2 tablets= 8 mg   TRAMADOL (ULTRAM) 50 MG TABLET    Take 100 mg by mouth 2 (two) times daily as needed for moderate [name].   VITAMIN B-12 (CYANOCOBALAMIN) 500 MCG TABLET    Take 500 mcg by mouth daily.  Modified Medications   No medications on file  Discontinued Medications   ACETAMINOPHEN (TYLENOL) 500 MG TABLET    Take 1,000 mg by mouth every 8 (eight) hours as needed for mild [name] or moderate [name].   SENNA (SENOKOT) 8.6 MG TABLET    Take 2 tablets by mouth 2 (two) times daily.     Allergies  Allergen Reactions  . Penicillins     Has patient had a PCN  reaction causing immediate rash, facial/tongue/throat swelling, SOB or lightheadedness with hypotension: No No Has patient had a PCN reaction causing severe rash involving mucus membranes or skin necrosis: No NO Has patient had a PCN reaction that required hospitalization No No Has patient had a PCN reaction occurring within the last 10 years: NoNo If all of the above answers are "NO", then may proceed with Cephalosporin   . Tetracyclines & Related Other (See Comments)    Throat ulcer     REVIEW OF SYSTEMS:  GENERAL: no change in appetite, no fatigue, no weight changes, no fever, chills or weakness EYES: Denies change in vision, dry eyes, eye [name], itching or discharge EARS: Denies change in hearing, ringing in ears, or earache NOSE: Denies nasal congestion or epistaxis MOUTH and THROAT: Denies oral discomfort, gingival [name] or bleeding, [name] from teeth or hoarseness   RESPIRATORY: no cough, SOB, DOE, wheezing, hemoptysis CARDIAC: no chest [name], edema or palpitations GI: no abdominal [name], diarrhea, heart burn, nausea or vomiting, +constipation GU: Denies dysuria, frequency, hematuria, incontinence, or discharge PSYCHIATRIC: Denies feeling of depression or anxiety. No report of hallucinations, insomnia, paranoia, or agitation   PHYSICAL EXAMINATION  GENERAL APPEARANCE: Well nourished. In no acute distress. Normal body habitus SKIN:  Midline lower lumbar incision is dry, no redness HEAD: Normal in size and contour. No evidence of trauma EYES: Lids open and close normally. No blepharitis, entropion or ectropion. PERRL. Conjunctivae are clear and sclerae are white. Lenses are without opacity EARS: Pinnae are normal. Patient hears normal voice tunes of the examiner MOUTH and THROAT: Lips are without lesions. Oral mucosa is moist and without lesions. Tongue is normal in shape, size, and color and without lesions NECK: supple, trachea midline, no neck masses, no thyroid tenderness, no  thyromegaly LYMPHATICS: no LAN in the neck, no supraclavicular LAN RESPIRATORY: breathing is even & unlabored, BS CTAB CARDIAC: RRR, no murmur,no extra heart sounds, no edema GI: abdomen soft, normal BS, no masses, no tenderness, no hepatomegaly, no splenomegaly EXTREMITIES:  Able to move X 4 extremities PSYCHIATRIC: Alert and oriented X 3. Affect and behavior are appropriate  LABS/RADIOLOGY: Labs reviewed: Basic Metabolic Panel:  Recent Labs  16/10/96 09/28/15 1043 10/09/15  NA 139 139 138  K  --  4.6 4.5  CL  --  103  --   CO2  --  26  --   GLUCOSE  --  84  --   BUN CREATININE 0.9 0.93 0.9  CALCIUM  --  9.8  --    CBC:  Recent Labs  09/28/15 09/28/15 1043 10/09/15  WBC 5.1 5.1 5.7  HGB  --  13.1 8.8*  HCT  --  40.0 28*  MCV  --  92.6  --   PLT  --  202 196     Dg Lumbar Spine 2-3 Views  10/05/2015  CLINICAL DATA:  Posterior fusion. EXAM: DG C-ARM 61-120 MIN; LUMBAR SPINE - 2-3 VIEW COMPARISON:  10/05/2015. FINDINGS: Lumbar spine numbered as per prior exam. L4-L5 posterior fusion screws noted. L4-L5 interbody fusion. Stable mild anterolisthesis L4 on L5 . Mild stable anterolisthesis. Three images obtained. 0 minutes 49 seconds fluoroscopy time. IMPRESSION: Postsurgical changes lumbar spine as above. Electronically Signed   By: Maisie Fus  Register   On: 10/05/2015 11:49   Dg Lumbar Spine 2-3 Views  10/05/2015  CLINICAL DATA:  Posterior fusion. EXAM: LUMBAR SPINE - 2-3 VIEW COMPARISON:  None. FINDINGS: Metallic marker noted posteriorly at the L4-L5 disc space. Diffuse degenerative change. No acute bony abnormality. IMPRESSION: Metallic marker noted posteriorly at the L4-L5 disc space. Electronically Signed   By: Maisie Fus  Register   On: 10/05/2015 11:22   Dg C-arm 1-60 Min  10/05/2015  CLINICAL DATA:  Posterior fusion. EXAM: DG C-ARM 61-120 MIN; LUMBAR SPINE - 2-3 VIEW COMPARISON:  10/05/2015. FINDINGS: Lumbar spine numbered as per prior exam. L4-L5 posterior fusion  screws noted. L4-L5 interbody fusion. Stable mild anterolisthesis L4 on L5 . Mild stable anterolisthesis. Three images obtained. 0 minutes 49 seconds fluoroscopy time. IMPRESSION: Postsurgical changes lumbar spine as above. Electronically Signed   By: Maisie Fus  Register   On: 10/05/2015 11:49    ASSESSMENT/PLAN:  Anemia, acute blood loss - hemoglobin 8.8; start ferrous sulfate 325 mg 1 tab by mouth twice a day; CBC in 1 week  Elevated liver enzymes - check liver enzymes in 1 week  Protein calorie malnutrition, severe - albumin 2.70; start Procel 2 scoops by mouth twice a day     [facility], NP [employer] [phone]

## 2015-10-19 ENCOUNTER — Encounter: Payer: Self-pay | Admitting: Adult Health

## 2015-10-19 ENCOUNTER — Non-Acute Institutional Stay (SKILLED_NURSING_FACILITY): Payer: Medicare Other | Admitting: Adult Health

## 2015-10-19 DIAGNOSIS — E785 Hyperlipidemia, unspecified: Secondary | ICD-10-CM

## 2015-10-19 DIAGNOSIS — E559 Vitamin D deficiency, unspecified: Secondary | ICD-10-CM

## 2015-10-19 DIAGNOSIS — M4806 Spinal stenosis, lumbar region: Secondary | ICD-10-CM | POA: Diagnosis not present

## 2015-10-19 DIAGNOSIS — J302 Other seasonal allergic rhinitis: Secondary | ICD-10-CM

## 2015-10-19 DIAGNOSIS — K5901 Slow transit constipation: Secondary | ICD-10-CM

## 2015-10-19 DIAGNOSIS — E43 Unspecified severe protein-calorie malnutrition: Secondary | ICD-10-CM

## 2015-10-19 DIAGNOSIS — F329 Major depressive disorder, single episode, unspecified: Secondary | ICD-10-CM | POA: Diagnosis not present

## 2015-10-19 DIAGNOSIS — D62 Acute posthemorrhagic anemia: Secondary | ICD-10-CM | POA: Diagnosis not present

## 2015-10-19 DIAGNOSIS — M48062 Spinal stenosis, lumbar region with neurogenic claudication: Secondary | ICD-10-CM

## 2015-10-19 DIAGNOSIS — M810 Age-related osteoporosis without current pathological fracture: Secondary | ICD-10-CM

## 2015-10-19 DIAGNOSIS — F32A Depression, unspecified: Secondary | ICD-10-CM

## 2015-10-19 DIAGNOSIS — F419 Anxiety disorder, unspecified: Secondary | ICD-10-CM | POA: Diagnosis not present

## 2015-10-19 NOTE — Progress Notes (Signed)
Patient ID: Brittany PainBarbara S [name], female   DOB: 13-Apr-1946, 70 y.o.   MRN: [id number]     DATE:  10/19/2015   MRN:  [id number]  BIRTHDAY: 13-Apr-1946  Facility:  Nursing Home Location:  [facility]  Nursing Home Room Number: 1204-P  LEVEL OF CARE:  SNF [phone])  Contact Information    Name Relation Home Work Mobile   Brittany [name],Brittany Sister  [phone]       Code Status History    Date Active Date Inactive Code Status Order ID Comments User Context   10/05/2015 12:02 PM 10/07/2015  8:50 PM Full Code [id number]  Brittany Kellyobert Nudelman, MD Inpatient       Chief Complaint  Patient presents with  . Discharge Note    HISTORY OF PRESENT ILLNESS:  This is a 70 year old female who is for dischargeHome with home health OT for ADLs, PT for endurance and CNA for showers. DME:  Rolling walker.   [name]. She has PMH of Depression, Anxiety, Arthritis and Hypercholesterolemia. She has Lumbar stenosis with neurogenic claudication for which he had decompression 0n 10/05/15.  Patient was admitted to this facility for short-term rehabilitation after the patient's recent hospitalization.  Patient has completed SNF rehabilitation and therapy has cleared the patient for discharge.    Depression   . Anxiety   . Arthritis   . Hypercholesteremia           PAST MEDICAL HISTORY:  Past Medical History  Diagnosis Date  . Depression   . Anxiety   . Arthritis   . Hypercholesteremia   . Lumbar stenosis with neurogenic claudication     L4-5  . Degenerative spondylolisthesis     L4-5, grade 1  . Lumbar spondylosis   . Lumbar degenerative disc disease   . Lumbago   . Left lumbar radiculopathy   . Weakness of both lower extremities   . Spinal stenosis   . Osteoporosis   . Slow transit constipation   . Post-nasal drip   . HLD (hyperlipidemia)   . Vitamin D deficiency      CURRENT MEDICATIONS:  Reviewed  Patient's Medications  New Prescriptions   No medications on file  Previous Medications   ALENDRONATE (FOSAMAX) 70 MG TABLET    Take 70 mg by mouth once a week. Take with a full glass of water on an empty stomach.   ALPRAZOLAM (XANAX) 0.25 MG TABLET    Take 0.25-0.5 mg by mouth 2 (two) times daily as needed for anxiety or sleep.   BISACODYL (DULCOLAX) 10 MG SUPPOSITORY    Place 10 mg rectally daily as needed for moderate constipation.   CALCIUM CARBONATE (TUMS EX) 750 MG CHEWABLE TABLET    Chew 1 tablet by mouth daily.   CHOLECALCIFEROL (VITAMIN D-3 PO)    Take 1,000 Units by mouth daily.   CITALOPRAM (CELEXA) 40 MG TABLET    Take 40 mg by mouth daily.   FERROUS SULFATE 325 (65 FE) MG TABLET    Take 325 mg by mouth 2 (two) times daily with a meal.   HYDROCODONE-ACETAMINOPHEN (NORCO/VICODIN) 5-325 MG TABLET    Take 1-2 tablets by mouth every 4 (four) hours as needed (mild [name]).   LORATADINE (CLARITIN) 10 MG TABLET    Take 10 mg by mouth daily.   MELOXICAM (MOBIC) 15 MG TABLET    Take 15 mg by mouth daily.   MISC NATURAL PRODUCTS (GLUCOSAMINE CHONDROITIN  MSM) TABS    Take 1 tablet by mouth daily.   POLYETHYLENE GLYCOL (MIRALAX / GLYCOLAX) PACKET    Take 17 g by mouth daily.    PROTEIN (PROCEL) POWD    Take 2 scoop by mouth 2 (two) times daily.   SENNOSIDES-DOCUSATE SODIUM (SENOKOT-S) 8.6-50 MG TABLET    Take 2 tablets by mouth 2 (two) times daily.   SIMVASTATIN (ZOCOR) 20 MG TABLET    Take 20 mg by mouth daily.   TIZANIDINE (ZANAFLEX) 4 MG CAPSULE    Take 4 mg by mouth 2 (two) times daily as needed.    TIZANIDINE (ZANAFLEX) 4 MG TABLET    Take 8 mg by mouth at bedtime. Take 2 tablets= 8 mg   TRAMADOL (ULTRAM) 50 MG TABLET    Take 100 mg by mouth 2 (two) times daily as needed for moderate [name].   VITAMIN B-12 (CYANOCOBALAMIN) 500 MCG TABLET    Take 500 mcg by mouth daily.  Modified Medications   No medications on file  Discontinued Medications   No medications on file      Allergies  Allergen Reactions  . Penicillins     Has patient had a PCN reaction causing immediate rash, facial/tongue/throat swelling, SOB or lightheadedness with hypotension: No No Has patient had a PCN reaction causing severe rash involving mucus membranes or skin necrosis: No NO Has patient had a PCN reaction that required hospitalization No No Has patient had a PCN reaction occurring within the last 10 years: NoNo If all of the above answers are "NO", then may proceed with Cephalosporin   . Tetracyclines & Related Other (See Comments)    Throat ulcer     REVIEW OF SYSTEMS:  GENERAL: no change in appetite, no fatigue, no weight changes, no fever, chills or weakness EYES: Denies change in vision, dry eyes, eye [name], itching or discharge EARS: Denies change in hearing, ringing in ears, or earache NOSE: Denies nasal congestion or epistaxis MOUTH and THROAT: Denies oral discomfort, gingival [name] or bleeding, [name] from teeth or hoarseness   RESPIRATORY: no cough, SOB, DOE, wheezing, hemoptysis CARDIAC: no chest [name], edema or palpitations GI: no abdominal [name], diarrhea, heart burn, nausea or vomiting, +constipation GU: Denies dysuria, frequency, hematuria, incontinence, or discharge PSYCHIATRIC: Denies feeling of depression or anxiety. No report of hallucinations, insomnia, paranoia, or agitation   PHYSICAL EXAMINATION  GENERAL APPEARANCE: Well nourished. In no acute distress. Normal body habitus SKIN:  Midline lower lumbar incision is healed HEAD: Normal in size and contour. No evidence of trauma EYES: Lids open and close normally. No blepharitis, entropion or ectropion. PERRL. Conjunctivae are clear and sclerae are white. Lenses are without opacity EARS: Pinnae are normal. Patient hears normal voice tunes of the examiner MOUTH and THROAT: Lips are without lesions. Oral mucosa is moist and without lesions. Tongue is normal in shape, size, and color and without lesions NECK:  supple, trachea midline, no neck masses, no thyroid tenderness, no thyromegaly LYMPHATICS: no LAN in the neck, no supraclavicular LAN RESPIRATORY: breathing is even & unlabored, BS CTAB CARDIAC: RRR, no murmur,no extra heart sounds, no edema GI: abdomen soft, normal BS, no masses, no tenderness, no hepatomegaly, no splenomegaly EXTREMITIES:  Able to move X 4 extremities PSYCHIATRIC: Alert and oriented X 3. Affect and behavior are appropriate  LABS/RADIOLOGY: Labs reviewed: 10/19/15   WBC 5.6 hemoglobin 10.9 hematocrit 35.7 MCV 94.7 platelets 561 total protein 6.0 albumin 3.4 total bilirubin 0.22 direct bilirubin 0.04 alkaline phosphatase 123 SGOT  14 SGPT 13 Basic Metabolic Panel:  Recent Labs  16/10/96 09/28/15 1043 10/09/15  NA 139 139 138  K  --  4.6 4.5  CL  --  103  --   CO2  --  26  --   GLUCOSE  --  84  --   BUN CREATININE 0.9 0.93 0.9  CALCIUM  --  9.8  --    CBC:  Recent Labs  09/28/15 09/28/15 1043 10/09/15  WBC 5.1 5.1 5.7  HGB  --  13.1 8.8*  HCT  --  40.0 28*  MCV  --  92.6  --   PLT  --  202 196     Dg Lumbar Spine 2-3 Views  10/05/2015  CLINICAL DATA:  Posterior fusion. EXAM: DG C-ARM 61-120 MIN; LUMBAR SPINE - 2-3 VIEW COMPARISON:  10/05/2015. FINDINGS: Lumbar spine numbered as per prior exam. L4-L5 posterior fusion screws noted. L4-L5 interbody fusion. Stable mild anterolisthesis L4 on L5 . Mild stable anterolisthesis. Three images obtained. 0 minutes 49 seconds fluoroscopy time. IMPRESSION: Postsurgical changes lumbar spine as above. Electronically Signed   By: Maisie Fus  Register   On: 10/05/2015 11:49   Dg Lumbar Spine 2-3 Views  10/05/2015  CLINICAL DATA:  Posterior fusion. EXAM: LUMBAR SPINE - 2-3 VIEW COMPARISON:  None. FINDINGS: Metallic marker noted posteriorly at the L4-L5 disc space. Diffuse degenerative change. No acute bony abnormality. IMPRESSION: Metallic marker noted posteriorly at the L4-L5 disc space. Electronically Signed   By: Maisie Fus   Register   On: 10/05/2015 11:22   Dg C-arm 1-60 Min  10/05/2015  CLINICAL DATA:  Posterior fusion. EXAM: DG C-ARM 61-120 MIN; LUMBAR SPINE - 2-3 VIEW COMPARISON:  10/05/2015. FINDINGS: Lumbar spine numbered as per prior exam. L4-L5 posterior fusion screws noted. L4-L5 interbody fusion. Stable mild anterolisthesis L4 on L5 . Mild stable anterolisthesis. Three images obtained. 0 minutes 49 seconds fluoroscopy time. IMPRESSION: Postsurgical changes lumbar spine as above. Electronically Signed   By: Maisie Fus  Register   On: 10/05/2015 11:49    ASSESSMENT/PLAN:  Lumbar stenosis with neurogenic claudication S/P decompression - for Home health PT, OT and CNA; continue Norco 5/325 mg 1-2 tabs PO Q 4 hours PRN, Mobic 15 mg 1 tab by mouth daily, tramadol 50 mg 2 tabs = 100 mg by mouth twice a day when necessary  for [name]; tizanidine 4 mg 2 tabs by mouth daily daily at bedtime and add tizanidine 4 mg 1 tab by mouth twice a day when necessary for muscle spasm; follow-up with Dr. Newell Coral, neurosurgeon  Vitamin D deficiency  - Continue vitamin D3 1000 units 1 tab by mouth daily   Depression - mood is stable; continue Citalopram 40 mg 1 tab by mouth daily  Constipation -  continue senna S2 tabs by mouth twice a day, Dulcolax 10 mg 1 suppository per rectum daily PRN and MiraLAX 17 g by mouth daily  Seasonal allergy - continue loratadine 10 mg 1 tab by mouth daily  Hyperlipidemia -  continue simvastatin 20 mg 1 tab by mouth daily; check lipid panel  Osteoporosis - continue Fosamax 70 mg 1 tab by mouth every week and glucosamine chondroitin MSM 1 tab by mouth daily  Anxiety - mood is stable; continue alprazolam 0.25 mg 1-2 tabs PO BID PRN  Anemia, acute blood loss - hemoglobin 8.8; continue ferrous sulfate 1 tab by mouth twice a day; recheck hemoglobin 10.9  Protein calorie malnutrition, severe - albumin 2.70; continue Procel 2 scoops  by mouth twice a day  Elevated liver enzymes  -  resolved     I have  filled out patient's discharge paperwork and written prescriptions.  Patient will receive home health PT, OT and CNA.  DME provided:  Rolling walker  Total discharge time: Greater than 30 minutes  Discharge time involved coordination of the discharge process with Child psychotherapist, nursing staff and therapy department. Medical justification for home health services/DME verified.    [facility], NP [employer] [phone]

## 2016-02-23 ENCOUNTER — Ambulatory Visit: Payer: Medicare Other | Attending: Neurosurgery

## 2016-02-23 DIAGNOSIS — M25651 Stiffness of right hip, not elsewhere classified: Secondary | ICD-10-CM

## 2016-02-23 DIAGNOSIS — M545 Low back pain, unspecified: Secondary | ICD-10-CM

## 2016-02-23 DIAGNOSIS — M25652 Stiffness of left hip, not elsewhere classified: Secondary | ICD-10-CM

## 2016-02-23 DIAGNOSIS — R293 Abnormal posture: Secondary | ICD-10-CM

## 2016-02-23 NOTE — Patient Instructions (Addendum)
Perform all exercises below:  Hold _20___ seconds. Repeat _3___ times.  Do __3__ sessions per day. CAUTION: Movement should be gentle, steady and slow.  Knee to Chest  Lying supine, bend involved knee to chest. Perform with each leg.    HIP: Hamstrings - Short Sitting   Rest leg on raised surface. Keep knee straight. Lift chest.      Lifting Principles  .Maintain proper posture and head alignment. .Slide object as close as possible before lifting. .Move obstacles out of the way. .Test before lifting; ask for help if too heavy. .Tighten stomach muscles without holding breath. .Use smooth movements; do not jerk. .Use legs to do the work, and pivot with feet. .Distribute the work load symmetrically and close to the center of trunk. .Push instead of pull whenever possible.   Squat down and hold basket close to stand. Use leg muscles to do the work.    Avoid twisting or bending back. Pivot around using foot movements, and bend at knees if needed when reaching for articles.        Getting Into / Out of Bed   Lower self to lie down on one side by raising legs and lowering head at the same time. Use arms to assist moving without twisting. Bend both knees to roll onto back if desired. To sit up, start from lying on side, and use same move-ments in reverse. Keep trunk aligned with legs.    Shift weight from front foot to back foot as item is lifted off shelf.    When leaning forward to pick object up from floor, extend one leg out behind. Keep back straight. Hold onto a sturdy support with other hand.      Sit upright, head facing forward. Try using a roll to support lower back. Keep shoulders relaxed, and avoid rounded back. Keep hips level with knees. Avoid crossing legs for long periods.     [facility] [address], [address] [place], [place] [address] Phone # [phone] Fax [phone]

## 2016-02-23 NOTE — Therapy (Signed)
[facility] [address] [place], [place], [address] Phone: [phone]   Fax:  [phone]  Physical Therapy Treatment  Patient Details  Name: Brittany [name] MRN: [id number] Date of Birth: 1945/10/15 Referring Provider: Shirlean Kelly, MD  Encounter Date: 02/23/2016      PT End of Session - 02/23/16 1313    Visit Number 1   Number of Visits 10   Date for PT Re-Evaluation 04/19/16   PT Start Time 1234   PT Stop Time 1314   PT Time Calculation (min) 40 min   Activity Tolerance Patient tolerated treatment well   Behavior During Therapy [facility] for tasks assessed/performed      Past Medical History:  Diagnosis Date  . Anxiety   . Arthritis   . Degenerative spondylolisthesis    L4-5, grade 1  . Depression   . HLD (hyperlipidemia)   . Hypercholesteremia   . Left lumbar radiculopathy   . Lumbago   . Lumbar degenerative disc disease   . Lumbar spondylosis   . Lumbar stenosis with neurogenic claudication    L4-5  . Osteoporosis   . Post-nasal drip   . Slow transit constipation   . Spinal stenosis   . Vitamin D deficiency   . Weakness of both lower extremities     Past Surgical History:  Procedure Laterality Date  . EYE SURGERY    . TONSILLECTOMY      There were no vitals filed for this visit.      Subjective Assessment - 02/23/16 1242    Subjective Pt presents to PT s/p lumbar fusion L4-5 in March 2017.  Pt reports that she was making steady progress after surgery and felt like she was starting to decline 3 weeks ago. Pt is not sure if she might have been doing too much.     Pertinent History Lumbar fusion 09/2015, Lt LE symptoms prior to surgery   Limitations Sitting;Standing;Walking   How long can you stand comfortably? 10 minutes max   How long can you walk comfortably? 10 minutes    Diagnostic tests x-ray 02/2016: negative   Patient Stated Goals return to regular walking for exercise (20  minutes), return to regular activity   Currently in Pain? Yes   Pain Score 3    Pain Location Back   Pain Orientation Lower;Left   Pain Descriptors / Indicators Burning;Aching   Pain Type Acute pain   Pain Onset 1 to 4 weeks ago   Pain Frequency Intermittent   Aggravating Factors  sitting, standing, walking long periods   Pain Relieving Factors laying down, heat getting off of my legs            [facility] PT Assessment - 02/23/16 0001      Assessment   Medical Diagnosis s/p lumbarfusion, arthrodesis status   Referring Provider Shirlean Kelly, MD   Onset Date/Surgical Date 10/05/15   Next MD Visit 04/01/16   Prior Therapy none     Precautions   Precautions Back   Precaution Comments fusion     Restrictions   Weight Bearing Restrictions No     Balance Screen   Has the patient fallen in the past 6 months No   Has the patient had a decrease in activity level because of a fear of falling?  No   Is the patient reluctant to leave their home because of a fear of falling?  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   Leisure walking, hiking     Cognition   Overall Cognitive Status Within Functional Limits for tasks assessed     Observation/Other Assessments   Focus on Therapeutic Outcomes (FOTO)  62% limitation     Posture/Postural Control   Posture/Postural Control Postural limitations   Postural Limitations Forward head;Rounded Shoulders     ROM / Strength   AROM / PROM / Strength AROM;PROM;Strength     AROM   Overall AROM  Deficits;Due to pain   Overall AROM Comments Lumbar flexion limited by >75% as pt didn't want to bend forward, sidebending limited by 75%     PROM   Overall PROM  Deficits   Overall  PROM Comments bil hip flexibility limited by 50% in bil. hips     Strength   Overall Strength Deficits   Overall Strength Comments 4+/5 bil. hip and knee strength     Palpation   Palpation comment palpable tenderness over bil lumbar paraspinals and SI joint.  Surgical incision is well healed.       Ambulation/Gait   Ambulation/Gait Yes   Ambulation/Gait Assistance 7: Independent   Ambulation Distance (Feet) 100 Feet   Gait Pattern Within Functional Limits     Functional Gait  Assessment   Gait assessed  Yes                             PT Education - 02/23/16 1304    Education provided Yes   Education Details HEP and body mechanics education   Person(s) Educated Patient   Methods Explanation;Handout   Comprehension Verbalized understanding          PT Short Term Goals - 02/23/16 1318      PT SHORT TERM GOAL #1   Title be independent in initial HEP   Time 4   Period Weeks   Status New     PT SHORT TERM GOAL #2   Title demonstrate and verbalize correct body mechanics modifications for home tasks and self-care to protect spine   Time 4   Period Weeks   Status New     PT SHORT TERM GOAL #3   Title report a 30% reduction in LBP with sitting and standing >10 minutes   Time 4   Period Weeks   Status New     PT SHORT TERM GOAL #4   Title sit and stand for 15 minutes without limitation   Time 4   Period Weeks   Status New           PT Long Term Goals - 02/23/16 1321      PT LONG TERM GOAL #1   Title be independent in advanced HEP   Time 8   Period Weeks   Status New     PT LONG TERM GOAL #2   Title report a 50% reduction in LBP with sitting and standing > 20 minutes   Time 8   Period Weeks   Status New     PT LONG TERM GOAL #3   Title sit and stand for 30 minutes without limitation   Time 8   Period  Weeks   Status New     PT LONG TERM GOAL #4   Title resume regular walking program and walk for 20 minutes without limitation  due to LBP   Time 8   Period Weeks   Status New               Plan - 02/23/16 1314    Clinical Impression Statement Pt presents to PT with flare-up of Lt sided LBP that began 3 weeks ago.  Pt had spinal fusion 09/2015 and reports that she was doing much better until 3 weeks ago.  Pt is limited in sitting and standing > 5-10 minutes. Pt demonstrates 50-75% limitation in lumbar AROM, 50% limitation in hip flexiblity, hip weakness and postural abnormality and poor body mechanics with transition and mobiltiy.  Pt will benefit from skilled PT for core strength progression, hip flexiblity, and body mechanics education for home tasks.     Rehab Potential Good   PT Frequency 2x / week   PT Duration 8 weeks   PT Treatment/Interventions ADLs/Self Care Home Management;Cryotherapy;Electrical Stimulation;Functional mobility training;Ultrasound;Therapeutic activities;Therapeutic exercise;Neuromuscular re-education;Patient/family education;Passive range of motion;Manual techniques;Dry needling;Taping   PT Next Visit Plan Body mechanics education, gentle flexiblity, core strength progression, modalities as needed for pain   Consulted and Agree with Plan of Care Patient      Patient will benefit from skilled therapeutic intervention in order to improve the following deficits and impairments:  Postural dysfunction, Decreased strength, Impaired flexibility, Improper body mechanics, Pain, Decreased activity tolerance, Decreased endurance, Increased muscle spasms, Decreased range of motion  Visit Diagnosis: Left-sided low back pain without sciatica - Plan: PT plan of care cert/re-cert  Abnormal posture - Plan: PT plan of care cert/re-cert  Stiffness of left hip, not elsewhere classified - Plan: PT plan of care cert/re-cert  Stiffness of right hip, not elsewhere classified - Plan: PT plan of care cert/re-cert       G-Codes - 02/23/16 1234    Functional Assessment Tool Used FOTO: 62% limitation    Functional Limitation Other PT primary   Other PT Primary Current Status (L2440(G8990) At least 60 percent but less than 80 percent impaired, limited or restricted   Other PT Primary Goal Status (N0272(G8991) At least 40 percent but less than 60 percent impaired, limited or restricted      Problem List Patient Active Problem List   Diagnosis Date Noted  . Lumbar stenosis with neurogenic claudication 10/05/2015     Lorrene ReidKelly Takacs, PT 02/23/16 1:27 PM  [facility] [address], [address] [place], [place], [address] Phone: [phone]   Fax:  [phone]  Name: Brittany [name] MRN: [id number] Date of Birth: [birth year]

## 2016-02-25 ENCOUNTER — Ambulatory Visit: Payer: Medicare Other | Admitting: Physical Therapy

## 2016-02-25 ENCOUNTER — Encounter: Payer: Self-pay | Admitting: Physical Therapy

## 2016-02-25 DIAGNOSIS — M545 Low back pain, unspecified: Secondary | ICD-10-CM

## 2016-02-25 DIAGNOSIS — R293 Abnormal posture: Secondary | ICD-10-CM

## 2016-02-25 NOTE — Therapy (Signed)
[facility] [address] [place], [place], [address] Phone: [phone]   Fax:  [phone]  Physical Therapy Treatment  Patient Details  Name: Brittany PainBarbara S [name] MRN: [id number] Date of Birth: July 21, 1945 Referring Provider: Shirlean KellyNudelman, Robert, MD  Encounter Date: 02/25/2016      PT End of Session - 02/25/16 1230    PT Start Time 1151   PT Stop Time 1231   PT Time Calculation (min) 40 min   Activity Tolerance Patient tolerated treatment well   Behavior During Therapy [facility] for tasks assessed/performed      Past Medical History:  Diagnosis Date  . Anxiety   . Arthritis   . Degenerative spondylolisthesis    L4-5, grade 1  . Depression   . HLD (hyperlipidemia)   . Hypercholesteremia   . Left lumbar radiculopathy   . Lumbago   . Lumbar degenerative disc disease   . Lumbar spondylosis   . Lumbar stenosis with neurogenic claudication    L4-5  . Osteoporosis   . Post-nasal drip   . Slow transit constipation   . Spinal stenosis   . Vitamin D deficiency   . Weakness of both lower extremities     Past Surgical History:  Procedure Laterality Date  . EYE SURGERY    . TONSILLECTOMY      There were no vitals filed for this visit.      Subjective Assessment - 02/25/16 1153    Patient Stated Goals return to regular walking for exercise (20 minutes), return to regular activity   [name] Score 2    [name] Location Back   [name] Orientation Lower   [name] Descriptors / Indicators Aching   [name] Onset 1 to 4 weeks ago                         [facility] Adult PT Treatment/Exercise - 02/25/16 0001      Exercises   Exercises Lumbar     Lumbar Exercises: Stretches   Lower Trunk Rotation 60 seconds   Lower Trunk Rotation Limitations small ROM   Piriformis Stretch 2 reps;20 seconds     Lumbar Exercises: Aerobic   Stationary Bike nustep level 1 x 5 minutes     Lumbar Exercises: Supine   Ab Set 5 reps;3  seconds   Bent Knee Raise 5 reps;5 seconds   Other Supine Lumbar Exercises hip add squeeze x 10     Lumbar Exercises: Sidelying   Clam 20 reps     Manual Therapy   Manual Therapy Soft tissue mobilization   Soft tissue mobilization Lt lumbar paraspinals and piriformis                  PT Short Term Goals - 02/25/16 1156      PT SHORT TERM GOAL #1   Title be independent in initial HEP   Status On-going     PT SHORT TERM GOAL #2   Title demonstrate and verbalize correct body mechanics modifications for home tasks and self-care to protect spine   Status On-going     PT SHORT TERM GOAL #3   Title report a 30% reduction in LBP with sitting and standing >10 minutes   Status On-going     PT SHORT TERM GOAL #4   Title sit and stand for 15 minutes without limitation   Status On-going           PT Long Term Goals -  02/25/16 1157      PT LONG TERM GOAL #1   Title be independent in advanced HEP   Status On-going     PT LONG TERM GOAL #2   Title report a 50% reduction in LBP with sitting and standing > 20 minutes   Status On-going     PT LONG TERM GOAL #3   Title sit and stand for 30 minutes without limitation   Status On-going     PT LONG TERM GOAL #4   Title resume regular walking program and walk for 20 minutes without limitation due to LBP   Status On-going               Plan - 02/25/16 1230    Clinical Impression Statement Pt able to tolerate exercise and manual well today without increase in [name]. Pt states she has a TENS machine and has tried ice and heat so did not want to try modalities today.   Rehab Potential Good   PT Frequency 2x / week   PT Duration 8 weeks   PT Treatment/Interventions ADLs/Self Care Home Management;Cryotherapy;Electrical Stimulation;Functional mobility training;Ultrasound;Therapeutic activities;Therapeutic exercise;Neuromuscular re-education;Patient/family education;Passive range of motion;Manual techniques;Dry  needling;Taping   PT Next Visit Plan body mechanics, progress ab and hip strength, flexibility   Consulted and Agree with Plan of Care Patient      Patient will benefit from skilled therapeutic intervention in order to improve the following deficits and impairments:  Postural dysfunction, Decreased strength, Impaired flexibility, Improper body mechanics, [name], Decreased activity tolerance, Decreased endurance, Increased muscle spasms, Decreased range of motion  Visit Diagnosis: Left-sided low back [name] without sciatica  Abnormal posture     Problem List Patient Active Problem List   Diagnosis Date Noted  . Lumbar stenosis with neurogenic claudication 10/05/2015    Reggy EyeKaren Heidy [name], PT, DPT 02/25/2016, 12:36 PM  [facility] [address] [place], [place], [address] Phone: [phone]   Fax:  [phone]  Name: Brittany PainBarbara S [name] MRN: [id number] Date of Birth: 07-28-1945

## 2016-02-25 NOTE — Patient Instructions (Signed)
Pelvic Tilt    Flatten back by tightening stomach muscles and buttocks. Repeat ____ times per set. Do ____ sets per session. Do ____ sessions per day.  http://orth.exer.us/134   Copyright  VHI. All rights reserved.  Lower Trunk Rotation Stretch    Keeping back flat and feet together, rotate knees to left side. Hold ____ seconds. Repeat ____ times per set. Do ____ sets per session. Do ____ sessions per day.  http://orth.exer.us/122   Copyright  VHI. All rights reserved.  Abduction: Clam (Eccentric) - Side-Lying   Lie on side with knees bent. Lift top knee, keeping feet together. Keep trunk steady. Slowly lower for 3-5 seconds. ___ reps per set, ___ sets per day, ___ days per week. Add ___ lbs when you achieve ___ repetitions.    Copyright  VHI. All rights reserved.

## 2016-03-02 ENCOUNTER — Ambulatory Visit: Payer: Medicare Other | Admitting: Physical Therapy

## 2016-03-02 ENCOUNTER — Encounter: Payer: Self-pay | Admitting: Physical Therapy

## 2016-03-02 DIAGNOSIS — M545 Low back pain, unspecified: Secondary | ICD-10-CM

## 2016-03-02 DIAGNOSIS — M25652 Stiffness of left hip, not elsewhere classified: Secondary | ICD-10-CM

## 2016-03-02 DIAGNOSIS — M25651 Stiffness of right hip, not elsewhere classified: Secondary | ICD-10-CM

## 2016-03-02 DIAGNOSIS — R293 Abnormal posture: Secondary | ICD-10-CM

## 2016-03-02 NOTE — Therapy (Signed)
[facility] [address] [place], [place], [address] Phone: [phone]   Fax:  [phone]  Physical Therapy Treatment  Patient Details  Name: Brittany [name] MRN: [id number] Date of Birth: 1946/04/17 Referring Provider: Shirlean KellyNudelman, Robert, MD  Encounter Date: 03/02/2016      PT End of Session - 03/02/16 1426    Visit Number 3   Number of Visits 10   Date for PT Re-Evaluation 04/19/16   PT Start Time 1407   PT Stop Time 1443   PT Time Calculation (min) 36 min   Activity Tolerance Patient tolerated treatment well   Behavior During Therapy [facility] for tasks assessed/performed      Past Medical History:  Diagnosis Date  . Anxiety   . Arthritis   . Degenerative spondylolisthesis    L4-5, grade 1  . Depression   . HLD (hyperlipidemia)   . Hypercholesteremia   . Left lumbar radiculopathy   . Lumbago   . Lumbar degenerative disc disease   . Lumbar spondylosis   . Lumbar stenosis with neurogenic claudication    L4-5  . Osteoporosis   . Post-nasal drip   . Slow transit constipation   . Spinal stenosis   . Vitamin D deficiency   . Weakness of both lower extremities     Past Surgical History:  Procedure Laterality Date  . EYE SURGERY    . TONSILLECTOMY      There were no vitals filed for this visit.      Subjective Assessment - 03/02/16 1409    Subjective Pt reports pain in back after driving in the car out of town. Pt reports has been resting since trip which has seemed to help.    Pertinent History Lumbar fusion 09/2015, Lt LE symptoms prior to surgery   How long can you stand comfortably? 10 minutes max   How long can you walk comfortably? 10 minutes    Diagnostic tests x-ray 02/2016: negative   Patient Stated Goals return to regular walking for exercise (20 minutes), return to regular activity   Currently in Pain? Yes   Pain Score 3    Pain Location Back   Pain Orientation Lower   Pain  Descriptors / Indicators Aching   Pain Type Acute pain   Pain Onset 1 to 4 weeks ago   Pain Frequency Intermittent   Aggravating Factors  Acitvities such as walking or sitting   Pain Relieving Factors laying down, resting   Multiple Pain Sites No                         [facility] Adult PT Treatment/Exercise - 03/02/16 0001      Exercises   Exercises Knee/Hip     Lumbar Exercises: Stretches   Passive Hamstring Stretch 2 reps;20 seconds   Piriformis Stretch 2 reps;20 seconds     Lumbar Exercises: Aerobic   Stationary Bike Bike 5 mins L2     Lumbar Exercises: Supine   Clam --   Other Supine Lumbar Exercises Supine Marches x40     Knee/Hip Exercises: Supine   Bridges AROM;Strengthening;Both;2 sets;10 reps   Straight Leg Raises AROM;Strengthening;Both;2 sets;10 reps     Knee/Hip Exercises: Sidelying   Hip ABduction AROM;Strengthening;Both;2 sets;10 reps   Hip ADduction --     Knee/Hip Exercises: Prone   Hip Extension AROM;Strengthening;Both;2 sets;10 reps     Manual Therapy   Manual Therapy --   Soft tissue mobilization --  PT Short Term Goals - 03/02/16 1411      PT SHORT TERM GOAL #1   Title be independent in initial HEP   Time 4   Period Weeks   Status On-going     PT SHORT TERM GOAL #2   Title demonstrate and verbalize correct body mechanics modifications for home tasks and self-care to protect spine   Time 4   Period Weeks   Status On-going     PT SHORT TERM GOAL #3   Title report a 30% reduction in LBP with sitting and standing >10 minutes   Time 4   Period Weeks   Status On-going     PT SHORT TERM GOAL #4   Title sit and stand for 15 minutes without limitation   Time 4   Period Weeks   Status On-going           PT Long Term Goals - 03/02/16 1412      PT LONG TERM GOAL #1   Title be independent in advanced HEP   Time 8   Period Weeks   Status On-going     PT LONG TERM GOAL #2   Title report a 50%  reduction in LBP with sitting and standing > 20 minutes   Time 8   Period Weeks   Status On-going     PT LONG TERM GOAL #3   Title sit and stand for 30 minutes without limitation   Time 8   Status On-going     PT LONG TERM GOAL #4   Title resume regular walking program and walk for 20 minutes without limitation due to LBP   Time 8   Period Weeks   Status On-going               Plan - 03/02/16 1426    Clinical Impression Statement Pt able to tolerate all strengthening exercises today. Will progress with standing exercises as tolerated. Continue to strengthen core and Bil LE and decrease pain.    Rehab Potential Good   PT Frequency 2x / week   PT Duration 8 weeks   PT Treatment/Interventions ADLs/Self Care Home Management;Cryotherapy;Electrical Stimulation;Functional mobility training;Ultrasound;Therapeutic activities;Therapeutic exercise;Neuromuscular re-education;Patient/family education;Passive range of motion;Manual techniques;Dry needling;Taping   PT Next Visit Plan body mechanics, progress ab and hip strength, flexibility   Consulted and Agree with Plan of Care Patient      Patient will benefit from skilled therapeutic intervention in order to improve the following deficits and impairments:  Postural dysfunction, Decreased strength, Impaired flexibility, Improper body mechanics, Pain, Decreased activity tolerance, Decreased endurance, Increased muscle spasms, Decreased range of motion  Visit Diagnosis: Left-sided low back pain without sciatica  Abnormal posture  Stiffness of left hip, not elsewhere classified  Stiffness of right hip, not elsewhere classified     Problem List Patient Active Problem List   Diagnosis Date Noted  . Lumbar stenosis with neurogenic claudication 10/05/2015    Dessa PhiKatherine Maelie Chriswell PTA 03/02/2016, 2:48 PM  [facility] [address] [place], [place], [address] Phone:  [phone]   Fax:  [phone]  Name: Brittany [name] MRN: [id number] Date of Birth: [birth year]

## 2016-03-03 ENCOUNTER — Ambulatory Visit: Payer: Medicare Other | Admitting: Physical Therapy

## 2016-03-03 ENCOUNTER — Encounter: Payer: Self-pay | Admitting: Physical Therapy

## 2016-03-03 DIAGNOSIS — M545 Low back pain, unspecified: Secondary | ICD-10-CM

## 2016-03-03 DIAGNOSIS — M25652 Stiffness of left hip, not elsewhere classified: Secondary | ICD-10-CM

## 2016-03-03 DIAGNOSIS — R293 Abnormal posture: Secondary | ICD-10-CM

## 2016-03-03 DIAGNOSIS — M25651 Stiffness of right hip, not elsewhere classified: Secondary | ICD-10-CM

## 2016-03-03 NOTE — Therapy (Signed)
[facility] [address], [address] [place], [place], [address] Phone: [phone]   Fax:  [phone]  Physical Therapy Treatment  Patient Details  Name: Brittany [name] MRN: [id number] Date of Birth: [birth year] Referring Provider: Shirlean Kelly, MD  Encounter Date: 03/03/2016      PT End of Session - 03/03/16 1249    Visit Number 4   Number of Visits 10   Date for PT Re-Evaluation 04/19/16   PT Start Time 1241   PT Stop Time 1326   PT Time Calculation (min) 45 min   Activity Tolerance Patient tolerated treatment well   Behavior During Therapy [facility] for tasks assessed/performed      Past Medical History:  Diagnosis Date  . Anxiety   . Arthritis   . Degenerative spondylolisthesis    L4-5, grade 1  . Depression   . HLD (hyperlipidemia)   . Hypercholesteremia   . Left lumbar radiculopathy   . Lumbago   . Lumbar degenerative disc disease   . Lumbar spondylosis   . Lumbar stenosis with neurogenic claudication    L4-5  . Osteoporosis   . Post-nasal drip   . Slow transit constipation   . Spinal stenosis   . Vitamin D deficiency   . Weakness of both lower extremities     Past Surgical History:  Procedure Laterality Date  . EYE SURGERY    . TONSILLECTOMY      There were no vitals filed for this visit.      Subjective Assessment - 03/03/16 1243    Subjective Pt reports pain in back after driving in the car out of town. Pt reports has been resting since trip which has seemed to help. Pt has not taken pain meds yet today.    Pertinent History Lumbar fusion 09/2015, Lt LE symptoms prior to surgery   Limitations Sitting;Standing;Walking   How long can you stand comfortably? 10 minutes max   How long can you walk comfortably? 10 minutes    Diagnostic tests x-ray 02/2016: negative   Patient Stated Goals return to regular walking for exercise (20 minutes), return to regular activity   Currently in Pain? Yes   Pain Score 2    Pain Location Back   Pain Orientation Lower   Pain Descriptors / Indicators Aching   Pain Type Acute pain   Pain Onset 1 to 4 weeks ago   Pain Frequency Intermittent   Aggravating Factors  Activities such as walking or sitting   Pain Relieving Factors Laying down, resting   Multiple Pain Sites No                         [facility] Adult PT Treatment/Exercise - 03/03/16 0001      Lumbar Exercises: Stretches   Passive Hamstring Stretch 2 reps;20 seconds   Lower Trunk Rotation 60 seconds   Lower Trunk Rotation Limitations small ROM   Piriformis Stretch 2 reps;20 seconds     Lumbar Exercises: Aerobic   Stationary Bike Bike 5 mins L2  While therapist assessing pain     Lumbar Exercises: Supine   Other Supine Lumbar Exercises Supine Marches x40     Knee/Hip Exercises: Supine   Bridges AROM;Strengthening;Both;2 sets;10 reps     Modalities   Modalities Moist Heat     Moist Heat Therapy   Moist Heat Location Lumbar Spine  during exercises     Manual Therapy   Manual Therapy Soft tissue  mobilization   Soft tissue mobilization Lt lumbar paraspinals and piriformis                PT Education - 03/03/16 1326    Education provided Yes   Education Details Supine strengthening   Person(s) Educated Patient   Methods Handout;Explanation   Comprehension Verbalized understanding          PT Short Term Goals - 03/02/16 1411      PT SHORT TERM GOAL #1   Title be independent in initial HEP   Time 4   Period Weeks   Status On-going     PT SHORT TERM GOAL #2   Title demonstrate and verbalize correct body mechanics modifications for home tasks and self-care to protect spine   Time 4   Period Weeks   Status On-going     PT SHORT TERM GOAL #3   Title report a 30% reduction in LBP with sitting and standing >10 minutes   Time 4   Period Weeks   Status On-going     PT SHORT TERM GOAL #4   Title sit and stand for 15 minutes without  limitation   Time 4   Period Weeks   Status On-going           PT Long Term Goals - 03/02/16 1412      PT LONG TERM GOAL #1   Title be independent in advanced HEP   Time 8   Period Weeks   Status On-going     PT LONG TERM GOAL #2   Title report a 50% reduction in LBP with sitting and standing > 20 minutes   Time 8   Period Weeks   Status On-going     PT LONG TERM GOAL #3   Title sit and stand for 30 minutes without limitation   Time 8   Status On-going     PT LONG TERM GOAL #4   Title resume regular walking program and walk for 20 minutes without limitation due to LBP   Time 8   Period Weeks   Status On-going               Plan - 03/03/16 1250    Clinical Impression Statement Pt discouraged today that pain is not better. Needing some encouragement. Able to tolertae supine exercises well. Pt has most tenderness at Lt PSIS joint. Will try US at next visit and self trigger point release.    Rehab Potential Good   PT Frequency 2x / week   PT Duration 8 weeks   PT Treatment/Interventions ADLs/Self Care Home Management;Cryotherapy;Electrical Stimulation;Functional mobility training;Ultrasound;Therapeutic activities;Therapeutic exercise;Neuromuscular re-education;Patient/family education;Passive range of motion;Manual techniques;Dry needling;Taping   PT Next Visit Plan body mechanics, progress ab and hip strength, flexibility   PT Home Exercise Plan US to PSIS    Consulted and Agree with Plan of Care Patient      Patient will benefit from skilled therapeutic intervention in order to improve the following deficits and impairments:  Postural dysfunction, Decreased strength, Impaired flexibility, Improper body mechanics, Pain, Decreased activity tolerance, Decreased endurance, Increased muscle spasms, Decreased range of motion  Visit Diagnosis: Left-sided low back pain without sciatica  Abnormal posture  Stiffness of left hip, not elsewhere classified  Stiffness  of right hip, not elsewhere classified     Problem List Patient Active Problem List   Diagnosis Date Noted  . Lumbar stenosis with neurogenic claudication 10/05/2015    Dessa PhiKatherine Matthews PTA 03/03/2016, 1:28 PM  [facility] [address], [address] [place], [place], [address] Phone: [phone]   Fax:  [phone]  Name: Renee PainBarbara S Montalvo MRN: [id number] Date of Birth: 1946-04-27

## 2016-03-03 NOTE — Patient Instructions (Signed)
Abdominals: Single Leg Bend    Lying on back with legs out straight, inhale, then exhale while slowly sliding heel along floor toward buttocks. Slowly return to starting position. Repeat _10___ times each leg per set. Do __2__ sets per session. Do ____ sessions per day.  Copyright  VHI. All rights reserved.  Small Ball Marching Supine    Lie supine with small ball under tailbone, knees flexed. Raise one leg a few inches. Hold briefly, return and raise other leg. Keep hips stationary. Do _2__ sets of _20__ repetitions.  Copyright  VHI. All rights reserved.   [facility] [address], [address] [place], [place] [address] Phone # [phone] Fax [phone]

## 2016-03-07 ENCOUNTER — Ambulatory Visit: Payer: Medicare Other | Admitting: Physical Therapy

## 2016-03-07 ENCOUNTER — Encounter: Payer: Self-pay | Admitting: Physical Therapy

## 2016-03-07 DIAGNOSIS — M25652 Stiffness of left hip, not elsewhere classified: Secondary | ICD-10-CM

## 2016-03-07 DIAGNOSIS — M545 Low back pain, unspecified: Secondary | ICD-10-CM

## 2016-03-07 DIAGNOSIS — M25651 Stiffness of right hip, not elsewhere classified: Secondary | ICD-10-CM

## 2016-03-07 DIAGNOSIS — R293 Abnormal posture: Secondary | ICD-10-CM

## 2016-03-07 NOTE — Therapy (Addendum)
[facility] [address], [address] [place], [place], [address] Phone: [phone]   Fax:  [phone]  Physical Therapy Treatment  Patient Details  Name: Brittany [name] MRN: [id number] Date of Birth: Jan 02, 1946 Referring Provider: Shirlean KellyNudelman, Robert, MD  Encounter Date: 03/07/2016      PT End of Session - 03/07/16 1227    Visit Number 5   Number of Visits 10   Date for PT Re-Evaluation 04/19/16   PT Start Time 1153   PT Stop Time 1227   PT Time Calculation (min) 34 min   Activity Tolerance Patient tolerated treatment well   Behavior During Therapy [facility] for tasks assessed/performed      Past Medical History:  Diagnosis Date  . Anxiety   . Arthritis   . Degenerative spondylolisthesis    L4-5, grade 1  . Depression   . HLD (hyperlipidemia)   . Hypercholesteremia   . Left lumbar radiculopathy   . Lumbago   . Lumbar degenerative disc disease   . Lumbar spondylosis   . Lumbar stenosis with neurogenic claudication    L4-5  . Osteoporosis   . Post-nasal drip   . Slow transit constipation   . Spinal stenosis   . Vitamin D deficiency   . Weakness of both lower extremities     Past Surgical History:  Procedure Laterality Date  . EYE SURGERY    . TONSILLECTOMY      There were no vitals filed for this visit.      Subjective Assessment - 03/07/16 1156    Subjective Pt reports back not doing well today. Reports it feels ok with rest but increases with activity.    Pertinent History Lumbar fusion 09/2015, Lt LE symptoms prior to surgery   Limitations Sitting;Standing;Walking   How long can you stand comfortably? 10 minutes max   How long can you walk comfortably? 10 minutes    Diagnostic tests x-ray 02/2016: negative   Patient Stated Goals return to regular walking for exercise (20 minutes), return to regular activity   Currently in [name]? Yes   [name] Score 1   increases with activity   [name] Location Back   [name]  Orientation Lower   [name] Descriptors / Indicators Aching                         [facility] Adult PT Treatment/Exercise - 03/07/16 0001      Knee/Hip Exercises: Supine   Bridges AROM;Strengthening;Both;2 sets;10 reps   Straight Leg Raises AROM;Strengthening;Both;2 sets;10 reps     Modalities   Modalities Moist Heat     Moist Heat Therapy   Moist Heat Location Lumbar Spine  during exercises     Manual Therapy   Manual Therapy Soft tissue mobilization   Soft tissue mobilization Lt lumbar paraspinals and piriformis    Ultra sound for 8 minutes at 1 Mz 0.8 intensity to Lt low back near SI joint to decrease [name] and tenderness.  Dessa PhiKatherine Abiel [place] PTA Addended 03/10/16              PT Short Term Goals - 03/07/16 1158      PT SHORT TERM GOAL #1   Title be independent in initial HEP   Time 4   Period Weeks   Status Achieved     PT SHORT TERM GOAL #2   Title demonstrate and verbalize correct body mechanics modifications for home tasks and self-care to protect spine  Time 4   Period Weeks   Status On-going     PT SHORT TERM GOAL #3   Title report a 30% reduction in LBP with sitting and standing >10 minutes   Time 4   Period Weeks   Status On-going     PT SHORT TERM GOAL #4   Title sit and stand for 15 minutes without limitation   Time 4   Period Weeks   Status On-going           PT Long Term Goals - 03/07/16 1158      PT LONG TERM GOAL #1   Title be independent in advanced HEP   Time 8   Period Weeks   Status On-going     PT LONG TERM GOAL #2   Title report a 50% reduction in LBP with sitting and standing > 20 minutes   Time 8   Period Weeks   Status On-going     PT LONG TERM GOAL #3   Title sit and stand for 30 minutes without limitation   Time 8   Period Weeks   Status On-going               Plan - 03/07/16 1206    Clinical Impression Statement Pt reports that back feels ok at rest but increases with activities such  as getting up and wlking around throughout the day. Pt able to tolerate all supine exercies well . Able to tolerate [place] well.    Rehab Potential Good   PT Frequency 2x / week   PT Duration 8 weeks   PT Treatment/Interventions ADLs/Self Care Home Management;Cryotherapy;Electrical Stimulation;Functional mobility training;Ultrasound;Therapeutic activities;Therapeutic exercise;Neuromuscular re-education;Patient/family education;Passive range of motion;Manual techniques;Dry needling;Taping   PT Next Visit Plan body mechanics, progress ab and hip strength, flexibility [place] and manual massage to low back   Consulted and Agree with Plan of Care Patient      Patient will benefit from skilled therapeutic intervention in order to improve the following deficits and impairments:     Visit Diagnosis: Left-sided low back [name] without sciatica  Abnormal posture  Stiffness of left hip, not elsewhere classified  Stiffness of right hip, not elsewhere classified     Problem List Patient Active Problem List   Diagnosis Date Noted  . Lumbar stenosis with neurogenic claudication 10/05/2015    Dessa Phi PTA 03/07/2016, 12:29 PM  [facility] [address], [address] [place], [place], [address] Phone: [phone]   Fax:  [phone]  Name: Brittany [name] MRN: [id number] Date of Birth: 04-24-1946

## 2016-03-10 ENCOUNTER — Ambulatory Visit: Payer: Medicare Other | Admitting: Physical Therapy

## 2016-03-10 ENCOUNTER — Encounter: Payer: Self-pay | Admitting: Physical Therapy

## 2016-03-10 DIAGNOSIS — M545 Low back pain, unspecified: Secondary | ICD-10-CM

## 2016-03-10 DIAGNOSIS — M25652 Stiffness of left hip, not elsewhere classified: Secondary | ICD-10-CM

## 2016-03-10 DIAGNOSIS — R293 Abnormal posture: Secondary | ICD-10-CM

## 2016-03-10 NOTE — Patient Instructions (Addendum)
Cat / Cow Flow    Inhale, press spine toward ceiling like a Halloween cat. Keeping strength in arms and abdominals, exhale to soften spine through neutral and into cow pose. Relax and repeat.  Repeat __3__ times.  Copyright  VHI. All rights reserved.

## 2016-03-10 NOTE — Therapy (Signed)
[facility] [address], [address] [place], [place], [address] Phone: [phone]   Fax:  [phone]  Physical Therapy Treatment  Patient Details  Name: Brittany [name] MRN: [id number] Date of Birth: [birth year] Referring Provider: Shirlean Kelly, MD  Encounter Date: 03/10/2016      PT End of Session - 03/10/16 1543    Visit Number 6   Number of Visits 10   Date for PT Re-Evaluation 04/19/16   PT Start Time 1446   PT Stop Time 1533   PT Time Calculation (min) 47 min   Activity Tolerance Patient tolerated treatment well   Behavior During Therapy [facility] for tasks assessed/performed      Past Medical History:  Diagnosis Date  . Anxiety   . Arthritis   . Degenerative spondylolisthesis    L4-5, grade 1  . Depression   . HLD (hyperlipidemia)   . Hypercholesteremia   . Left lumbar radiculopathy   . Lumbago   . Lumbar degenerative disc disease   . Lumbar spondylosis   . Lumbar stenosis with neurogenic claudication    L4-5  . Osteoporosis   . Post-nasal drip   . Slow transit constipation   . Spinal stenosis   . Vitamin D deficiency   . Weakness of both lower extremities     Past Surgical History:  Procedure Laterality Date  . EYE SURGERY    . TONSILLECTOMY      There were no vitals filed for this visit.      Subjective Assessment - 03/10/16 1551    Subjective Pt reports back stiff feels bad today. Pt is very upsest because she wants to be able to do activities with church and friends but has not been doing things she enjoys due to fear of aggrivating back pain. Pt reports she feels like she has not made progress since her road trip and is getting very discouraged.    Pertinent History Lumbar fusion 09/2015, Lt LE symptoms prior to surgery   Limitations Sitting;Standing;Walking   How long can you stand comfortably? 10 minutes max   How long can you walk comfortably? 10 minutes    Diagnostic tests x-ray  02/2016: negative   Patient Stated Goals return to regular walking for exercise (20 minutes), return to regular activity   Currently in Pain? Yes   Pain Score 1    Pain Location Back   Pain Orientation Lower   Pain Descriptors / Indicators Aching   Pain Type Acute pain   Pain Onset 1 to 4 weeks ago   Pain Frequency Intermittent   Aggravating Factors  Walking or sitting   Pain Relieving Factors Laying down, resting   Multiple Pain Sites No                         [facility] Adult PT Treatment/Exercise - 03/10/16 0001      Lumbar Exercises: Stretches   Quadruped Mid Back Stretch 3 reps;10 seconds     Lumbar Exercises: Supine   Bent Knee Raise 5 reps;5 seconds   Other Supine Lumbar Exercises Ball squeeze with ab set     Modalities   Modalities Moist Heat     Moist Heat Therapy   Moist Heat Location Lumbar Spine  during exercises     Manual Therapy   Manual Therapy Soft tissue mobilization;Joint mobilization   Joint Mobilization Lt hip joint mobz  Pt sidelying on Rt side   Soft tissue mobilization  Lt lumbar paraspinals and piriformis                PT Education - 03/10/16 1542    Education provided Yes   Education Details Posture education   Person(s) Educated Patient   Methods Explanation;Handout   Comprehension Verbalized understanding          PT Short Term Goals - 03/07/16 1158      PT SHORT TERM GOAL #1   Title be independent in initial HEP   Time 4   Period Weeks   Status Achieved     PT SHORT TERM GOAL #2   Title demonstrate and verbalize correct body mechanics modifications for home tasks and self-care to protect spine   Time 4   Period Weeks   Status On-going     PT SHORT TERM GOAL #3   Title report a 30% reduction in LBP with sitting and standing >10 minutes   Time 4   Period Weeks   Status On-going     PT SHORT TERM GOAL #4   Title sit and stand for 15 minutes without limitation   Time 4   Period Weeks   Status  On-going           PT Long Term Goals - 03/07/16 1158      PT LONG TERM GOAL #1   Title be independent in advanced HEP   Time 8   Period Weeks   Status On-going     PT LONG TERM GOAL #2   Title report a 50% reduction in LBP with sitting and standing > 20 minutes   Time 8   Period Weeks   Status On-going     PT LONG TERM GOAL #3   Title sit and stand for 30 minutes without limitation   Time 8   Period Weeks   Status On-going               Plan - 03/10/16 1555    Clinical Impression Statement Pt very discouraged today feeling like back pain is not getting any better. Pt is avoiding doing activities with church and friends due to fear of making back worse. Pt educated on proper posture and being aware of pelvic positioning while sitting. Discussed modifications of activities such as taking breaks from sitting and stretching. Will continue to strengthen core and improve posture.    Rehab Potential Good   PT Frequency 2x / week   PT Duration 8 weeks   PT Treatment/Interventions ADLs/Self Care Home Management;Cryotherapy;Electrical Stimulation;Functional mobility training;Ultrasound;Therapeutic activities;Therapeutic exercise;Neuromuscular re-education;Patient/family education;Passive range of motion;Manual techniques;Dry needling;Taping   PT Next Visit Plan body mechanics, progress ab and hip strength, flexibility [place] and manual massage to low back   PT Home Exercise Plan Posture strengthening    Consulted and Agree with Plan of Care Patient      Patient will benefit from skilled therapeutic intervention in order to improve the following deficits and impairments:  Postural dysfunction, Decreased strength, Impaired flexibility, Improper body mechanics, Pain, Decreased activity tolerance, Decreased endurance, Increased muscle spasms, Decreased range of motion  Visit Diagnosis: Left-sided low back pain without sciatica  Abnormal posture  Stiffness of left hip, not  elsewhere classified     Problem List Patient Active Problem List   Diagnosis Date Noted  . Lumbar stenosis with neurogenic claudication 10/05/2015    Dessa Phi PTA 03/10/2016, 4:00 PM  [facility] [address], [address] [place], [place], [address] Phone:  [phone]   Fax:  [phone]  Name: Brittany [name] MRN: [id number] Date of Birth: [birth year]

## 2016-03-15 ENCOUNTER — Encounter: Payer: Medicare Other | Admitting: Physical Therapy

## 2016-03-16 ENCOUNTER — Ambulatory Visit: Payer: Medicare Other | Attending: Neurosurgery | Admitting: Physical Therapy

## 2016-03-16 ENCOUNTER — Encounter: Payer: Self-pay | Admitting: Physical Therapy

## 2016-03-16 DIAGNOSIS — M545 Low back pain, unspecified: Secondary | ICD-10-CM

## 2016-03-16 DIAGNOSIS — R293 Abnormal posture: Secondary | ICD-10-CM | POA: Diagnosis present

## 2016-03-16 DIAGNOSIS — M25651 Stiffness of right hip, not elsewhere classified: Secondary | ICD-10-CM | POA: Diagnosis present

## 2016-03-16 DIAGNOSIS — M25652 Stiffness of left hip, not elsewhere classified: Secondary | ICD-10-CM | POA: Diagnosis present

## 2016-03-16 NOTE — Therapy (Signed)
[facility] [address], [address] [place], [place], [address] Phone: [phone]   Fax:  [phone]  Physical Therapy Treatment  Patient Details  Name: Brittany [name] MRN: [id number] Date of Birth: Sep 19, 1945 Referring Provider: Shirlean KellyNudelman, Robert, MD  Encounter Date: 03/16/2016      PT End of Session - 03/16/16 1701    Visit Number 7   Number of Visits 10   Date for PT Re-Evaluation 04/19/16   PT Start Time 1405   PT Stop Time 1446   PT Time Calculation (min) 41 min   Activity Tolerance Patient tolerated treatment well   Behavior [facility] Therapy [facility] for tasks assessed/performed      Past Medical History:  Diagnosis Date  . Anxiety   . Arthritis   . Degenerative spondylolisthesis    L4-5, grade 1  . Depression   . HLD (hyperlipidemia)   . Hypercholesteremia   . Left lumbar radiculopathy   . Lumbago   . Lumbar degenerative disc disease   . Lumbar spondylosis   . Lumbar stenosis with neurogenic claudication    L4-5  . Osteoporosis   . Post-nasal drip   . Slow transit constipation   . Spinal stenosis   . Vitamin D deficiency   . Weakness of both lower extremities     Past Surgical History:  Procedure Laterality Date  . EYE SURGERY    . TONSILLECTOMY      There were no vitals filed for this visit.      Subjective Assessment - 03/16/16 1410    Subjective Pt reports back feeling better today. Still having some pain but says it is better than it has been. Pt reports trying to be more aware of posture and trying to sit as upright as possible.    Pertinent History Lumbar fusion 09/2015, Lt LE symptoms prior to surgery   Limitations Sitting;Standing;Walking   How long can you stand comfortably? 10 minutes max   How long can you walk comfortably? 10 minutes    Diagnostic tests x-ray 02/2016: negative   Patient Stated Goals return to regular walking for exercise (20 minutes), return to regular activity    Currently in Pain? Yes   Pain Score 1    Pain Location Back   Pain Orientation Lower   Pain Descriptors / Indicators Aching   Pain Type Acute pain   Pain Onset 1 to 4 weeks ago   Pain Frequency Intermittent   Aggravating Factors  Walking or sitting   Pain Relieving Factors Laying down, resting   Multiple Pain Sites No                         [facility] Adult PT Treatment/Exercise - 03/16/16 0001      Lumbar Exercises: Stretches   Quadruped Mid Back Stretch 3 reps;10 seconds     Lumbar Exercises: Supine   Bent Knee Raise 5 reps;5 seconds  #2   Other Supine Lumbar Exercises Ball squeeze with ab set     Knee/Hip Exercises: Standing   Functional Squat 2 sets;5 reps  Sit to stand     Modalities   Modalities Ultrasound     Moist Heat Therapy   Moist Heat Location Lumbar Spine  during exercises     Ultrasound   Ultrasound Location Lt low back   Ultrasound Parameters 1Mz 0.6 intensity   Ultrasound Goals Pain  PT Short Term Goals - 03/16/16 1411      PT SHORT TERM GOAL #1   Title be independent in initial HEP   Time 4   Period Weeks   Status Achieved     PT SHORT TERM GOAL #2   Title demonstrate and verbalize correct body mechanics modifications for home tasks and self-care to protect spine   Time 4   Period Weeks   Status On-going     PT SHORT TERM GOAL #3   Title report a 30% reduction in LBP with sitting and standing >10 minutes   Time 4   Period Weeks   Status On-going     PT SHORT TERM GOAL #4   Title sit and stand for 15 minutes without limitation   Time 4   Period Weeks   Status On-going           PT Long Term Goals - 03/16/16 1413      PT LONG TERM GOAL #1   Title be independent in advanced HEP   Time 8   Period Weeks   Status On-going     PT LONG TERM GOAL #2   Title report a 50% reduction in LBP with sitting and standing > 20 minutes   Time 8   Period Weeks   Status On-going     PT LONG TERM  GOAL #3   Title sit and stand for 30 minutes without limitation   Time 8   Period Weeks   Status On-going     PT LONG TERM GOAL #4   Title resume regular walking program and walk for 20 minutes without limitation due to LBP   Time 8   Period Weeks   Status On-going               Plan - 03/16/16 1705    Clinical Impression Statement Pt reports back feeling better today and is in better spirits about progress. Pt has been being minful of posture. Able to tolerate core strenghtneing and back stretching well. [place] to Lt low back near incision to decrease pain, pt tolerated well. Will assess responce to [place] at next visit. Continue to strengthen and stabilize core.    Rehab Potential Good   PT Frequency 2x / week   PT Duration 8 weeks   PT Treatment/Interventions ADLs/Self Care Home Management;Cryotherapy;Electrical Stimulation;Functional mobility training;Ultrasound;Therapeutic activities;Therapeutic exercise;Neuromuscular re-education;Patient/family education;Passive range of motion;Manual techniques;Dry needling;Taping   PT Next Visit Plan body mechanics, progress ab and hip strength, flexibility [place] and manual massage to low back   PT Home Exercise Plan Posture strengthening    Consulted and Agree with Plan of Care Patient      Patient will benefit from skilled therapeutic intervention in order to improve the following deficits and impairments:  Postural dysfunction, Decreased strength, Impaired flexibility, Improper body mechanics, Pain, Decreased activity tolerance, Decreased endurance, Increased muscle spasms, Decreased range of motion  Visit Diagnosis: Left-sided low back pain without sciatica  Stiffness of left hip, not elsewhere classified  Abnormal posture  Stiffness of right hip, not elsewhere classified     Problem List Patient Active Problem List   Diagnosis Date Noted  . Lumbar stenosis with neurogenic claudication 10/05/2015    Dessa Phi  PTA 03/16/2016, 5:06 PM  [facility] [address] [place], [place], [address] Phone: [phone]   Fax:  [phone]  Name: Brittany [name] MRN: [id number] Date of Birth: 1945/07/15

## 2016-03-17 ENCOUNTER — Encounter: Payer: Self-pay | Admitting: Physical Therapy

## 2016-03-17 ENCOUNTER — Ambulatory Visit: Payer: Medicare Other | Admitting: Physical Therapy

## 2016-03-17 DIAGNOSIS — M545 Low back pain, unspecified: Secondary | ICD-10-CM

## 2016-03-17 DIAGNOSIS — M25651 Stiffness of right hip, not elsewhere classified: Secondary | ICD-10-CM

## 2016-03-17 DIAGNOSIS — R293 Abnormal posture: Secondary | ICD-10-CM

## 2016-03-17 DIAGNOSIS — M25652 Stiffness of left hip, not elsewhere classified: Secondary | ICD-10-CM

## 2016-03-17 NOTE — Therapy (Signed)
[facility] [address], [address] [place], [place], [address] Phone: [phone]   Fax:  [phone]  Physical Therapy Treatment  Patient Details  Name: Siobahn Worsley Kemple MRN: [id number] Date of Birth: 08/13/1945 Referring Provider: Shirlean Kelly, MD  Encounter Date: 03/17/2016      PT End of Session - 03/17/16 1500    Visit Number 8   Number of Visits 10   Date for PT Re-Evaluation 04/19/16   PT Start Time 1445   PT Stop Time 1525   PT Time Calculation (min) 40 min   Activity Tolerance Patient tolerated treatment well   Behavior During Therapy [facility] for tasks assessed/performed      Past Medical History:  Diagnosis Date  . Anxiety   . Arthritis   . Degenerative spondylolisthesis    L4-5, grade 1  . Depression   . HLD (hyperlipidemia)   . Hypercholesteremia   . Left lumbar radiculopathy   . Lumbago   . Lumbar degenerative disc disease   . Lumbar spondylosis   . Lumbar stenosis with neurogenic claudication    L4-5  . Osteoporosis   . Post-nasal drip   . Slow transit constipation   . Spinal stenosis   . Vitamin D deficiency   . Weakness of both lower extremities     Past Surgical History:  Procedure Laterality Date  . EYE SURGERY    . TONSILLECTOMY      There were no vitals filed for this visit.      Subjective Assessment - 03/17/16 1456    Subjective Pt reports back feeling ok today. Having some tingling in Lt low back. Pt wanting to go to an event this weekend but is nervous about comfort. Pt and therapist discussed energy conservation techniques and options for seating.    Pertinent History Lumbar fusion 09/2015, Lt LE symptoms prior to surgery   Limitations Sitting;Standing;Walking   How long can you stand comfortably? 10 minutes max   How long can you walk comfortably? 10 minutes    Diagnostic tests x-ray 02/2016: negative   Patient Stated Goals return to regular walking for exercise (20  minutes), return to regular activity   Currently in Pain? Yes   Pain Score 2    Pain Location Back   Pain Orientation Lower   Pain Descriptors / Indicators Aching   Pain Type Acute pain   Pain Onset 1 to 4 weeks ago   Pain Frequency Intermittent   Aggravating Factors  Walking or sitting   Pain Relieving Factors Laying down, resting   Multiple Pain Sites No                         [facility] Adult PT Treatment/Exercise - 03/17/16 0001      Lumbar Exercises: Stretches   Quadruped Mid Back Stretch 3 reps;10 seconds     Lumbar Exercises: Supine   Bent Knee Raise 5 reps;5 seconds  #2   Other Supine Lumbar Exercises Ball squeeze with ab set   Other Supine Lumbar Exercises Supine lat pull  red band 20     Knee/Hip Exercises: Standing   Functional Squat 5 reps;3 sets  Sit to stand     Modalities   Modalities Ultrasound     Moist Heat Therapy   Moist Heat Location Lumbar Spine  during exercises     Ultrasound   Ultrasound Location Lt low back   Ultrasound Parameters 0.6 intensity  Ultrasound Goals Pain                  PT Short Term Goals - 03/16/16 1411      PT SHORT TERM GOAL #1   Title be independent in initial HEP   Time 4   Period Weeks   Status Achieved     PT SHORT TERM GOAL #2   Title demonstrate and verbalize correct body mechanics modifications for home tasks and self-care to protect spine   Time 4   Period Weeks   Status On-going     PT SHORT TERM GOAL #3   Title report a 30% reduction in LBP with sitting and standing >10 minutes   Time 4   Period Weeks   Status On-going     PT SHORT TERM GOAL #4   Title sit and stand for 15 minutes without limitation   Time 4   Period Weeks   Status On-going           PT Long Term Goals - 03/16/16 1413      PT LONG TERM GOAL #1   Title be independent in advanced HEP   Time 8   Period Weeks   Status On-going     PT LONG TERM GOAL #2   Title report a 50% reduction in LBP  with sitting and standing > 20 minutes   Time 8   Period Weeks   Status On-going     PT LONG TERM GOAL #3   Title sit and stand for 30 minutes without limitation   Time 8   Period Weeks   Status On-going     PT LONG TERM GOAL #4   Title resume regular walking program and walk for 20 minutes without limitation due to LBP   Time 8   Period Weeks   Status On-going               Plan - 03/17/16 1543    Clinical Impression Statement Pt reports back feeling ok today. Feels good about progress. Pt able to tolerate all exercises well. Continue to increase core and hip stability and decrease pain.    Rehab Potential Good   PT Frequency 2x / week   PT Duration 8 weeks   PT Treatment/Interventions ADLs/Self Care Home Management;Cryotherapy;Electrical Stimulation;Functional mobility training;Ultrasound;Therapeutic activities;Therapeutic exercise;Neuromuscular re-education;Patient/family education;Passive range of motion;Manual techniques;Dry needling;Taping   PT Next Visit Plan body mechanics, progress ab and hip strength, flexibility US and manual massage to low back   Consulted and Agree with Plan of Care Patient      Patient will benefit from skilled therapeutic intervention in order to improve the following deficits and impairments:  Postural dysfunction, Decreased strength, Impaired flexibility, Improper body mechanics, Pain, Decreased activity tolerance, Decreased endurance, Increased muscle spasms, Decreased range of motion  Visit Diagnosis: Left-sided low back pain without sciatica  Stiffness of left hip, not elsewhere classified  Abnormal posture  Stiffness of right hip, not elsewhere classified     Problem List Patient Active Problem List   Diagnosis Date Noted  . Lumbar stenosis with neurogenic claudication 10/05/2015    Dessa PhiKatherine Matthews PTA 03/17/2016, 3:49 PM  [facility] [address], [address] [place], [place], [address] Phone: [phone]   Fax:  [phone]  Name: Renee PainBarbara S Vilchis MRN: [id number] Date of Birth: 09/19/1945

## 2016-03-22 ENCOUNTER — Ambulatory Visit: Payer: Medicare Other | Admitting: Physical Therapy

## 2016-03-22 ENCOUNTER — Encounter: Payer: Self-pay | Admitting: Physical Therapy

## 2016-03-22 DIAGNOSIS — M25651 Stiffness of right hip, not elsewhere classified: Secondary | ICD-10-CM

## 2016-03-22 DIAGNOSIS — M545 Low back pain, unspecified: Secondary | ICD-10-CM

## 2016-03-22 DIAGNOSIS — R293 Abnormal posture: Secondary | ICD-10-CM

## 2016-03-22 DIAGNOSIS — M25652 Stiffness of left hip, not elsewhere classified: Secondary | ICD-10-CM

## 2016-03-22 NOTE — Therapy (Signed)
[facility] [address], [address] [place], [place], [address] Phone: [phone]   Fax:  [phone]  Physical Therapy Treatment  Patient Details  Name: Brittany [name] MRN: [id number] Date of Birth: 05/04/1946 Referring Provider: Shirlean KellyNudelman, Robert, MD  Encounter Date: 03/22/2016      PT End of Session - 03/22/16 1457    Visit Number 9   Number of Visits 10   Date for PT Re-Evaluation 04/19/16   PT Start Time 1445   PT Stop Time 1542   PT Time Calculation (min) 57 min   Activity Tolerance Patient tolerated treatment well   Behavior During Therapy [facility] for tasks assessed/performed      Past Medical History:  Diagnosis Date  . Anxiety   . Arthritis   . Degenerative spondylolisthesis    L4-5, grade 1  . Depression   . HLD (hyperlipidemia)   . Hypercholesteremia   . Left lumbar radiculopathy   . Lumbago   . Lumbar degenerative disc disease   . Lumbar spondylosis   . Lumbar stenosis with neurogenic claudication    L4-5  . Osteoporosis   . Post-nasal drip   . Slow transit constipation   . Spinal stenosis   . Vitamin D deficiency   . Weakness of both lower extremities     Past Surgical History:  Procedure Laterality Date  . EYE SURGERY    . TONSILLECTOMY      There were no vitals filed for this visit.      Subjective Assessment - 03/22/16 1449    Subjective Pt reports some stinging today. [place] Able to walk and sit, has some increased pain but was able ot find a place to lie down and that helped.    Pertinent History Lumbar fusion 09/2015, Lt LE symptoms prior to surgery   Limitations Sitting;Standing;Walking   How long can you stand comfortably? 10 minutes max   How long can you walk comfortably? 10 minutes    Diagnostic tests x-ray 02/2016: negative   Patient Stated Goals return to regular walking for exercise (20 minutes), return to regular activity   Pain Score  1    Pain Location Back   Pain Orientation Lower   Pain Descriptors / Indicators Aching   Pain Type Acute pain   Pain Onset 1 to 4 weeks ago   Pain Frequency Intermittent   Aggravating Factors  Walking or sitting   Pain Relieving Factors Laying down, resting   Multiple Pain Sites No                         [facility] Adult PT Treatment/Exercise - 03/22/16 0001      Lumbar Exercises: Stretches   Quadruped Mid Back Stretch --     Lumbar Exercises: Supine   Bent Knee Raise 5 reps;5 seconds  #2   Other Supine Lumbar Exercises Ball squeeze with ab set   Other Supine Lumbar Exercises Supine lat pull  red band 20     Knee/Hip Exercises: Standing   Functional Squat 5 reps;3 sets  Sit to stand     Knee/Hip Exercises: Supine   Bridges Both;3 sets;5 reps  on red ball     Knee/Hip Exercises: Prone   Hamstring Curl 2 sets;10 reps     Modalities   Modalities Electrical Stimulation     Moist Heat Therapy   Moist Heat Location Lumbar Spine  during exercises     Programme researcher, broadcasting/film/video Location Bil low back    Electrical Stimulation Action IFC   Electrical Stimulation Parameters To tolerance   Electrical Stimulation Goals Pain     Manual Therapy   Manual Therapy Soft tissue mobilization   Manual therapy comments To LT paraspinals and low back                  PT Short Term Goals - 03/22/16 1452      PT SHORT TERM GOAL #1   Title be independent in initial HEP   Time 4   Period Weeks   Status Achieved     PT SHORT TERM GOAL #2   Title demonstrate and verbalize correct body mechanics modifications for home tasks and self-care to protect spine   Time 4   Period Weeks   Status On-going     PT SHORT TERM GOAL #3   Title report a 30% reduction in LBP with sitting and standing >10 minutes   Time 4   Period Weeks   Status On-going     PT SHORT TERM GOAL #4   Title sit and stand for 15 minutes without limitation   Time 4    Period Weeks   Status On-going           PT Long Term Goals - 03/22/16 1456      PT LONG TERM GOAL #1   Title be independent in advanced HEP   Time 8   Period Weeks   Status On-going     PT LONG TERM GOAL #2   Title report a 50% reduction in LBP with sitting and standing > 20 minutes   Time 8   Period Weeks   Status On-going     PT LONG TERM GOAL #3   Title sit and stand for 30 minutes without limitation   Time 8   Period Weeks   Status On-going     PT LONG TERM GOAL #4   Title resume regular walking program and walk for 20 minutes without limitation due to LBP   Time 8   Period Weeks   Status On-going               Plan - 03/22/16 1710    Clinical Impression Statement Pt reports back feeling ok today, just some tingling. Pt is much more positive about progress. Pt able to begin progressing to more resistant exercises. Will continue to progress to more standing exercises as tolerated.    Rehab Potential Good   PT Frequency 2x / week   PT Duration 8 weeks   PT Treatment/Interventions ADLs/Self Care Home Management;Cryotherapy;Electrical Stimulation;Functional mobility training;Ultrasound;Therapeutic activities;Therapeutic exercise;Neuromuscular re-education;Patient/family education;Passive range of motion;Manual techniques;Dry needling;Taping   PT Next Visit Plan body mechanics, progress ab and hip strength, flexibility [place] and manual massage to low back   Consulted and Agree with Plan of Care Patient      Patient will benefit from skilled therapeutic intervention in order to improve the following deficits and impairments:  Postural dysfunction, Decreased strength, Impaired flexibility, Improper body mechanics, Pain, Decreased activity tolerance, Decreased endurance, Increased muscle spasms, Decreased range of motion  Visit Diagnosis: Left-sided low back pain without sciatica  Stiffness of left hip, not elsewhere classified  Abnormal posture  Stiffness  of right hip, not elsewhere classified     Problem List Patient Active Problem List   Diagnosis Date Noted  . Lumbar stenosis with neurogenic claudication  10/05/2015    Dessa Phi PTA 03/22/2016, 5:13 PM  [facility] [address] [place], [place], [address] Phone: [phone]   Fax:  [phone]  Name: Brittany [name] MRN: [id number] Date of Birth: Dec 01, 1945

## 2016-03-23 ENCOUNTER — Other Ambulatory Visit: Payer: Self-pay | Admitting: Adult Health

## 2016-03-24 ENCOUNTER — Encounter: Payer: Self-pay | Admitting: Physical Therapy

## 2016-03-24 ENCOUNTER — Other Ambulatory Visit: Payer: Self-pay | Admitting: Adult Health

## 2016-03-24 ENCOUNTER — Ambulatory Visit: Payer: Medicare Other | Admitting: Physical Therapy

## 2016-03-24 DIAGNOSIS — M545 Low back pain, unspecified: Secondary | ICD-10-CM

## 2016-03-24 DIAGNOSIS — M25652 Stiffness of left hip, not elsewhere classified: Secondary | ICD-10-CM

## 2016-03-24 DIAGNOSIS — R293 Abnormal posture: Secondary | ICD-10-CM

## 2016-03-24 DIAGNOSIS — M25651 Stiffness of right hip, not elsewhere classified: Secondary | ICD-10-CM

## 2016-03-24 NOTE — Therapy (Signed)
[facility] [address] [place], [place], [address] Phone: [phone]   Fax:  [phone]  Physical Therapy Treatment  Patient Details  Name: Brittany [name] MRN: [id number] Date of Birth: [birth year] Referring Provider: Jovita Gamma, MD  Encounter Date: 03/24/2016      PT End of Session - 03/24/16 1455    Visit Number 10   Number of Visits 10   Date for PT Re-Evaluation 04/19/16   PT Start Time 0034   PT Stop Time 1535   PT Time Calculation (min) 53 min   Activity Tolerance Patient tolerated treatment well   Behavior During Therapy [facility] for tasks assessed/performed      Past Medical History:  Diagnosis Date  . Anxiety   . Arthritis   . Degenerative spondylolisthesis    L4-5, grade 1  . Depression   . HLD (hyperlipidemia)   . Hypercholesteremia   . Left lumbar radiculopathy   . Lumbago   . Lumbar degenerative disc disease   . Lumbar spondylosis   . Lumbar stenosis with neurogenic claudication    L4-5  . Osteoporosis   . Post-nasal drip   . Slow transit constipation   . Spinal stenosis   . Vitamin D deficiency   . Weakness of both lower extremities     Past Surgical History:  Procedure Laterality Date  . EYE SURGERY    . TONSILLECTOMY      There were no vitals filed for this visit.      Subjective Assessment - 03/24/16 1453    Subjective Pt repotrs only mild stinging in back today. Went to [employer] last night with no increase in pain. Pt is very happy that she has been able to return to doing more activities but still fears that she will hurt back again if she is not careful.    Pertinent History Lumbar fusion 09/2015, Lt LE symptoms prior to surgery   Limitations Sitting;Standing;Walking   Currently in Pain? Yes   Pain Score 1    Pain Location Back   Pain Orientation Lower   Pain Descriptors / Indicators Aching   Pain Type Acute pain   Pain Onset 1 to 4 weeks ago             [facility] PT Assessment - 03/24/16 0001      Assessment   Medical Diagnosis s/p lumbarfusion, arthrodesis status   Referring Provider Jovita Gamma, MD   Onset Date/Surgical Date 10/05/15   Next MD Visit 04/01/16   Prior Therapy none     Precautions   Precautions Back   Precaution Comments fusion     Indust[place] residence   Living Arrangements Alone   Type of [place] to enter   Entrance Stairs-Number of Steps 2     Prior Function   Level of [place] Retired   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   Leisure walking, hiking     Cognition   Overall Cognitive Status Within Functional Limits for tasks assessed     Observation/Other Assessments   Focus on Therapeutic Outcomes (FOTO)  57% Limitation                     [facility] Adult PT Treatment/Exercise - 03/24/16 0001      Lumbar Exercises: Stretches   Quadruped Mid Back Stretch 3 reps;10 seconds  Lumbar Exercises: Supine   Heel Slides 20 reps  #2   Bent Knee Raise 5 reps;5 seconds  #2   Other Supine Lumbar Exercises Ball squeeze with ab set   Other Supine Lumbar Exercises Supine lat pull  red band 20     Knee/Hip Exercises: Standing   Hip Abduction Stengthening   Hip Extension Stengthening;Both;2 sets;10 reps   Functional Squat 5 reps;3 sets  Sit to stand     Knee/Hip Exercises: Supine   Bridges Both;3 sets;5 reps  on red ball     Knee/Hip Exercises: Prone   Hamstring Curl 2 sets;10 reps     Modalities   Modalities Electrical Stimulation     Moist Heat Therapy   Moist Heat Location Lumbar Spine  during exercises     Electrical Stimulation   Electrical Stimulation Location Bil low back    Electrical Stimulation Action IFC   Electrical Stimulation Parameters To tolerance   Electrical Stimulation Goals Pain                  PT Short Term Goals - 03/24/16 1508      PT  SHORT TERM GOAL #2   Title demonstrate and verbalize correct body mechanics modifications for home tasks and self-care to protect spine   Time 4   Period Weeks   Status Achieved     PT SHORT TERM GOAL #3   Title report a 30% reduction in LBP with sitting and standing >10 minutes   Time 4   Period Weeks   Status On-going     PT SHORT TERM GOAL #4   Title sit and stand for 15 minutes without limitation   Time 4   Period Weeks   Status Partially Met           PT Long Term Goals - 03/22/16 1456      PT LONG TERM GOAL #1   Title be independent in advanced HEP   Time 8   Period Weeks   Status On-going     PT LONG TERM GOAL #2   Title report a 50% reduction in LBP with sitting and standing > 20 minutes   Time 8   Period Weeks   Status On-going     PT LONG TERM GOAL #3   Title sit and stand for 30 minutes without limitation   Time 8   Period Weeks   Status On-going     PT LONG TERM GOAL #4   Title resume regular walking program and walk for 20 minutes without limitation due to LBP   Time 8   Period Weeks   Status On-going               Plan - 03/24/16 1625    Clinical Impression Statement Pt reports mild tingling in back today. Has been able to return to doing more activities. Pt reports she is able to sit relativly comfortably for up to na hour with pillow and suppoer. Continues to have difficulty walking longer than 10 minutes at a time. Pt continues to progress with strength and flexibility. Will continue to progress towards all goals through strengthening.    Rehab Potential Good   PT Frequency 2x / week   PT Duration 8 weeks   PT Treatment/Interventions ADLs/Self Care Home Management;Cryotherapy;Electrical Stimulation;Functional mobility training;Ultrasound;Therapeutic activities;Therapeutic exercise;Neuromuscular re-education;Patient/family education;Passive range of motion;Manual techniques;Dry needling;Taping   PT Next Visit Plan body mechanics,  progress ab and hip strength, flexibility [place] and manual massage  to low back   Consulted and Agree with Plan of Care Patient      Patient will benefit from skilled therapeutic intervention in order to improve the following deficits and impairments:  Postural dysfunction, Decreased strength, Impaired flexibility, Improper body mechanics, Pain, Decreased activity tolerance, Decreased endurance, Increased muscle spasms, Decreased range of motion  Visit Diagnosis: Left-sided low back pain without sciatica  Stiffness of left hip, not elsewhere classified  Abnormal posture  Stiffness of right hip, not elsewhere classified       G-Codes - March 30, 2016 1619    Functional Assessment Tool Used FOTO   Functional Limitation Other PT primary   Other PT Primary Current Status (Q1237) At least 40 percent but less than 60 percent impaired, limited or restricted   Other PT Primary Goal Status (F9094) At least 40 percent but less than 60 percent impaired, limited or restricted      Problem List Patient Active Problem List   Diagnosis Date Noted  . Lumbar stenosis with neurogenic claudication 10/05/2015   Ruben Im, PT 03-30-2016 5:16 PM Phone: [phone] Fax: [phone]  Mikle Bosworth, PTA 03-30-2016 4:30 PM  [facility] [address] [place], [place], [address] Phone: [phone]   Fax:  [phone]  Name: Brittany [name] MRN: [id number] Date of Birth: August 20, 1945

## 2016-03-29 ENCOUNTER — Ambulatory Visit: Payer: Medicare Other | Admitting: Physical Therapy

## 2016-03-29 ENCOUNTER — Encounter: Payer: Self-pay | Admitting: Physical Therapy

## 2016-03-29 DIAGNOSIS — M25651 Stiffness of right hip, not elsewhere classified: Secondary | ICD-10-CM

## 2016-03-29 DIAGNOSIS — R293 Abnormal posture: Secondary | ICD-10-CM

## 2016-03-29 DIAGNOSIS — M545 Low back pain, unspecified: Secondary | ICD-10-CM

## 2016-03-29 DIAGNOSIS — M25652 Stiffness of left hip, not elsewhere classified: Secondary | ICD-10-CM

## 2016-03-29 NOTE — Therapy (Signed)
[facility] [address], [place], [place], [address] Phone: [phone]   Fax:  [phone]  Physical Therapy Treatment  Patient Details  Name: Brittany [name] MRN: [id number] Date of Birth: 24-Jun-1946 Referring Provider: Jovita Gamma, MD  Encounter Date: 03/29/2016      PT End of Session - 03/29/16 1546    Visit Number 11   Date for PT Re-Evaluation 04/19/16   PT Start Time 8889   PT Stop Time 1546   PT Time Calculation (min) 61 min   Activity Tolerance Patient tolerated treatment well   Behavior During Therapy [facility] for tasks assessed/performed      Past Medical History:  Diagnosis Date  . Anxiety   . Arthritis   . Degenerative spondylolisthesis    L4-5, grade 1  . Depression   . HLD (hyperlipidemia)   . Hypercholesteremia   . Left lumbar radiculopathy   . Lumbago   . Lumbar degenerative disc disease   . Lumbar spondylosis   . Lumbar stenosis with neurogenic claudication    L4-5  . Osteoporosis   . Post-nasal drip   . Slow transit constipation   . Spinal stenosis   . Vitamin D deficiency   . Weakness of both lower extremities     Past Surgical History:  Procedure Laterality Date  . EYE SURGERY    . TONSILLECTOMY      There were no vitals filed for this visit.      Subjective Assessment - 03/29/16 1458    Subjective Pt is very upset about having increased pain today. Pt blames self for relapsing because she has been more active. Pt is scheduled to see MD on Friday.    Pertinent History Lumbar fusion 09/2015, Lt LE symptoms prior to surgery   Limitations Sitting;Standing;Walking   How long can you stand comfortably? 10 minutes max   How long can you walk comfortably? 10 minutes    Diagnostic tests x-ray 02/2016: negative   Patient Stated Goals return to regular walking for exercise (20 minutes), return to regular activity   Currently in Pain? Yes   Pain Score 4    Pain Location Back    Pain Orientation Lower   Pain Descriptors / Indicators Aching   Pain Type Acute pain   Pain Onset 1 to 4 weeks ago                         [facility] Adult PT Treatment/Exercise - 03/29/16 0001      Lumbar Exercises: Stretches   Quadruped Mid Back Stretch 3 reps;10 seconds     Lumbar Exercises: Supine   Heel Slides 20 reps   Bent Knee Raise 5 reps;5 seconds   Other Supine Lumbar Exercises --     Knee/Hip Exercises: Prone   Hamstring Curl 2 sets;10 reps     Modalities   Modalities Electrical Stimulation     Moist Heat Therapy   Moist Heat Location Lumbar Spine  during exercises     Electrical Stimulation   Electrical Stimulation Location Bil low back    Electrical Stimulation Action IFC   Electrical Stimulation Parameters to tolerance   Electrical Stimulation Goals Pain     Manual Therapy   Manual Therapy Soft tissue mobilization   Manual therapy comments To LT paraspinals and low back   Soft tissue mobilization Tightness in Lt piriformis and along sacrum  PT Short Term Goals - 03/29/16 1547      PT SHORT TERM GOAL #1   Title be independent in initial HEP   Time 4   Period Weeks   Status Achieved     PT SHORT TERM GOAL #2   Title demonstrate and verbalize correct body mechanics modifications for home tasks and self-care to protect spine   Time 4   Period Weeks   Status Achieved     PT SHORT TERM GOAL #3   Title report a 30% reduction in LBP with sitting and standing >10 minutes   Time 4   Period Weeks   Status On-going     PT SHORT TERM GOAL #4   Title sit and stand for 15 minutes without limitation   Time 4   Period Weeks   Status Partially Met           PT Long Term Goals - 03/29/16 1548      PT LONG TERM GOAL #1   Title be independent in advanced HEP   Time 8   Period Weeks   Status On-going     PT LONG TERM GOAL #2   Title report a 50% reduction in LBP with sitting and standing > 20 minutes    Time 8   Period Weeks   Status On-going     PT LONG TERM GOAL #3   Title sit and stand for 30 minutes without limitation   Time 8   Period Weeks   Status On-going     PT LONG TERM GOAL #4   Title resume regular walking program and walk for 20 minutes without limitation due to LBP   Time 8   Period Weeks   Status On-going               Plan - 03/29/16 1554    Clinical Impression Statement Pt having increased pain today after struffling to go to the bathroom for a while last night. Pt very upset about hurting back and discouraged by set back. Pt educated on importance of hip and core stability and its effect on back pain. Pt able to tolerate all treatment wl ltoday and will continue to progress with strengthening.    Rehab Potential Good   PT Frequency 2x / week   PT Duration 8 weeks   PT Treatment/Interventions ADLs/Self Care Home Management;Cryotherapy;Electrical Stimulation;Functional mobility training;Ultrasound;Therapeutic activities;Therapeutic exercise;Neuromuscular re-education;Patient/family education;Passive range of motion;Manual techniques;Dry needling;Taping   PT Next Visit Plan body mechanics, progress ab and hip strength, flexibility [place] and manual massage to low back   Consulted and Agree with Plan of Care Patient      Patient will benefit from skilled therapeutic intervention in order to improve the following deficits and impairments:  Postural dysfunction, Decreased strength, Impaired flexibility, Improper body mechanics, Pain, Decreased activity tolerance, Decreased endurance, Increased muscle spasms, Decreased range of motion  Visit Diagnosis: Left-sided low back pain without sciatica  Stiffness of left hip, not elsewhere classified  Abnormal posture  Stiffness of right hip, not elsewhere classified     Problem List Patient Active Problem List   Diagnosis Date Noted  . Lumbar stenosis with neurogenic claudication 10/05/2015    Mikle Bosworth PTA 03/29/2016, 4:38 PM  [facility] [address] [place], [place], [address] Phone: [phone]   Fax:  [phone]  Name: Brittany [name] MRN: [id number] Date of Birth: 30-Oct-1945

## 2016-03-31 ENCOUNTER — Ambulatory Visit: Payer: Medicare Other | Admitting: Physical Therapy

## 2016-03-31 DIAGNOSIS — M545 Low back pain, unspecified: Secondary | ICD-10-CM

## 2016-03-31 DIAGNOSIS — M25651 Stiffness of right hip, not elsewhere classified: Secondary | ICD-10-CM

## 2016-03-31 DIAGNOSIS — M25652 Stiffness of left hip, not elsewhere classified: Secondary | ICD-10-CM

## 2016-03-31 DIAGNOSIS — R293 Abnormal posture: Secondary | ICD-10-CM

## 2016-03-31 NOTE — Therapy (Signed)
[facility] [address], [place], [place], [address] Phone: [phone]   Fax:  [phone]  Physical Therapy Treatment  Patient Details  Name: Brittany [name] MRN: [id number] Date of Birth: 1945/11/16 Referring Provider: Jovita Gamma, MD  Encounter Date: 03/31/2016      PT End of Session - 03/31/16 1503    Visit Number 12   Number of Visits 20   Date for PT Re-Evaluation 2016/05/18   Authorization Type G codes;  KX at visit 15   PT Start Time 3762   PT Stop Time 1540   PT Time Calculation (min) 55 min   Activity Tolerance Patient limited by pain      Past Medical History:  Diagnosis Date  . Anxiety   . Arthritis   . Degenerative spondylolisthesis    L4-5, grade 1  . Depression   . HLD (hyperlipidemia)   . Hypercholesteremia   . Left lumbar radiculopathy   . Lumbago   . Lumbar degenerative disc disease   . Lumbar spondylosis   . Lumbar stenosis with neurogenic claudication    L4-5  . Osteoporosis   . Post-nasal drip   . Slow transit constipation   . Spinal stenosis   . Vitamin D deficiency   . Weakness of both lower extremities     Past Surgical History:  Procedure Laterality Date  . EYE SURGERY    . TONSILLECTOMY      There were no vitals filed for this visit.      Subjective Assessment - 03/31/16 1448    Subjective States she has had a bad week possibly with pain exacerbation from straining with a bowel movement Monday.  "I feel very fragile right now."     Pertinent History Lumbar fusion 09/2015, Lt LE symptoms prior to surgery   How long can you walk comfortably? <10 minutes   Currently in Pain? Yes   Pain Score 4    Pain Location Back   Pain Orientation Left;Lower   Pain Type Chronic pain   Pain Onset More than a month ago   Pain Frequency Intermittent   Aggravating Factors  walking; sitting;  rubbing it causes stinging   Pain Relieving Factors lying down supine is best  position            [facility] PT Assessment - 03/31/16 0001      Observation/Other Assessments   Focus on Therapeutic Outcomes (FOTO)  57% Limitation                     [facility] Adult PT Treatment/Exercise - 03/31/16 0001      Lumbar Exercises: Stretches   Active Hamstring Stretch 3 reps;30 seconds   Active Hamstring Stretch Limitations with strap supine     Lumbar Exercises: Supine   Ab Set 10 reps   Clam 5 reps   Clam Limitations with ab brace each leg   Straight Leg Raises Limitations Decompression ex's per HEP   Isometric Hip Flexion 5 reps   Other Supine Lumbar Exercises Ball squeeze with ab set   Other Supine Lumbar Exercises shoulder ER with red band 10x     Moist Heat Therapy   Number Minutes Moist Heat 15 Minutes   Moist Heat Location Lumbar Spine     Electrical Stimulation   Electrical Stimulation Location Bilateral low back   Electrical Stimulation Action IFC   Electrical Stimulation Parameters to tolerance   Electrical Stimulation Goals Pain  PT Education - 03/31/16 1505    Education provided Yes   Education Details discussed benefits of aquatic exercise to progress walking tolerance and exercise;  supine decompression ex's;  extensive discussion on neuroscience of pain   Person(s) Educated Patient   Methods Explanation;Handout   Comprehension Verbalized understanding          PT Short Term Goals - 03/31/16 1734      PT SHORT TERM GOAL #1   Title be independent in initial HEP   Status Achieved     PT SHORT TERM GOAL #2   Title demonstrate and verbalize correct body mechanics modifications for home tasks and self-care to protect spine   Status Achieved     PT SHORT TERM GOAL #3   Title report a 30% reduction in LBP with sitting and standing >10 minutes   Time 4   Period Weeks   Status On-going     PT SHORT TERM GOAL #4   Title sit and stand for 15 minutes without limitation   Time 4   Period Weeks   Status  Partially Met           PT Long Term Goals - 03/31/16 1734      PT LONG TERM GOAL #1   Title be independent in advanced HEP   Time 8   Period Weeks   Status On-going     PT LONG TERM GOAL #2   Title report a 50% reduction in LBP with sitting and standing > 20 minutes   Time 8   Period Weeks   Status On-going     PT LONG TERM GOAL #3   Title sit and stand for 30 minutes without limitation   Time 8   Period Weeks   Status On-going     PT LONG TERM GOAL #4   Title resume regular walking program and walk for 20 minutes without limitation due to LBP   Time 8   Period Weeks   Status On-going               Plan - 03/31/16 1506    Clinical Impression Statement The patient states her pain is no better since starting therapy but she feels her LE muscle strength is better.  She reports her walking distance is < 10 min secondary to pain exacerbation.  Discussed an aquatic ex program for decreased spinal compression and weight bearing in order to faciliate walking and weight bearing tolerance.  Patient states she will consider this.  Slower to meet rehab goals secondary to frequent pain exacerbations.  Exercises continue to be at a low intensity level, modalities provide temporary relief.  Extensive discussion on neuroscience of pain and central sensitization.    Therapist closely monitoring response and modifying as needed.     PT Next Visit Plan LE and core strengthening in supine;  modalities as needed for pain control; see how appt went with the MD      Patient will benefit from skilled therapeutic intervention in order to improve the following deficits and impairments:     Visit Diagnosis: Left-sided low back pain without sciatica  Stiffness of left hip, not elsewhere classified  Abnormal posture  Stiffness of right hip, not elsewhere classified     Problem List Patient Active Problem List   Diagnosis Date Noted  . Lumbar stenosis with neurogenic claudication  10/05/2015    Ruben Im, PT 03/31/16 5:36 PM Phone: [phone] Fax: [phone]  Alvera Singh 03/31/2016, 5:36  PM  [facility] [address] [place], [place], [address] Phone: [phone]   Fax:  [phone]  Name: Brittany [name] MRN: [id number] Date of Birth: 1945/09/18

## 2016-03-31 NOTE — Patient Instructions (Signed)
RE-ALIGNMENT ROUTINE EXERCISES BASIC FOR POSTURAL CORRECTION   RE-ALIGNMENT Tips BENEFITS: 1.It helps to re-align the curves of the back and improve standing posture. 2.It allows the back muscles to rest and strengthen in preparation for more activity. FREQUENCY: Daily, even after weeks, months and years of more advanced exercises. START: 1.All exercises start in the same position: lying on the back, arms resting on the supporting surface, palms up and slightly away from the body, backs of hands down, knees bent, feet flat. 2.The head, neck, arms, and legs are supported according to specific instructions of your therapist. Copyright  VHI. All rights reserved.    1. Decompression Exercise: Basic.   Takes compression off the vertebral bodies; increases tolerance for lying on the back; helps relieve back pain   Lie on back on firm surface, knees bent, feet flat, arms turned up, out to sides (~35 degrees). Head neck and arms supported as necessary. Time _5-15__ minutes. Surface: floor     2. Shoulder Press  Strengthens upper back extensors and scapular retractors.   Press both shoulders down. Hold _2-3__ seconds. Repeat _3-5__ times. Surface: floor        3. Head Press With Chin Tuck  Strengthens neck extensors   Tuck chin SLIGHTLY toward chest, keep mouth closed. Feel weight on back of head. Increase weight by pressing head down. Hold _2-3__ seconds. Relax. Repeat 3-5___ times. Surface: floor   4. Leg Lengthener: stretches quadratus lumborum and hip flexors.  Strengthens quads and ankle dorsiflexors.  5.  Press whole leg down "into the sand."  5 sec hold 5x    Lavinia SharpsStacy Simpson PT [facility] [address], [address] [place], [place] [address] Phone # [phone] Fax [phone]

## 2016-05-17 ENCOUNTER — Ambulatory Visit: Payer: Medicare Other | Attending: Neurosurgery | Admitting: Physical Therapy

## 2016-05-17 DIAGNOSIS — M25651 Stiffness of right hip, not elsewhere classified: Secondary | ICD-10-CM | POA: Insufficient documentation

## 2016-05-17 DIAGNOSIS — G8929 Other chronic pain: Secondary | ICD-10-CM | POA: Insufficient documentation

## 2016-05-17 DIAGNOSIS — M545 Low back pain: Secondary | ICD-10-CM | POA: Diagnosis present

## 2016-05-17 DIAGNOSIS — M25652 Stiffness of left hip, not elsewhere classified: Secondary | ICD-10-CM | POA: Diagnosis present

## 2016-05-17 DIAGNOSIS — R293 Abnormal posture: Secondary | ICD-10-CM | POA: Diagnosis present

## 2016-05-17 NOTE — Therapy (Signed)
[facility] [address], [address] [place], [place], [address] Phone: [phone]   Fax:  [phone]  Physical Therapy Treatment/Recertification   Patient Details  Name: Brittany [name] MRN: [id number] Date of Birth: Jun 07, 1946 Referring Provider: Shirlean Kelly, MD  Encounter Date: 05/17/2016      PT End of Session - 05/17/16 1701    Visit Number 13   Number of Visits 20   Date for PT Re-Evaluation 07-29-2016   Authorization Type G codes;  KX at visit 15   PT Start Time 1025   PT Stop Time 1103   PT Time Calculation (min) 38 min   Activity Tolerance Patient limited by [name]      Past Medical History:  Diagnosis Date  . Anxiety   . Arthritis   . Degenerative spondylolisthesis    L4-5, grade 1  . Depression   . HLD (hyperlipidemia)   . Hypercholesteremia   . Left lumbar radiculopathy   . Lumbago   . Lumbar degenerative disc disease   . Lumbar spondylosis   . Lumbar stenosis with neurogenic claudication    L4-5  . Osteoporosis   . Post-nasal drip   . Slow transit constipation   . Spinal stenosis   . Vitamin D deficiency   . Weakness of both lower extremities     Past Surgical History:  Procedure Laterality Date  . EYE SURGERY    . TONSILLECTOMY      There were no vitals filed for this visit.      Subjective Assessment - 05/17/16 1025    Subjective Patient arrives 10 min late.  I had more [name] so the doctor did an xray and MRI and it didn't show anything wrong but he thought the nerve was inflammed but it is improving some.  Changed anti-inflammatory meds.  I stopped home ex for a while but I've restarted now.  My goal is to help reduce inflammation.  My goal is to walk without aggravating [name].  I want to go back to choir but I can't sit for practice.  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  Another long term goal is to be able to hike.  I have a home TENS unit but I  don't use it.  I've had so much fear and anxiety.   I'm here b/c my doctor told me to.   Currently in [name]? Yes   [name] Score 2    [name] Location Back   [name] Orientation Left;Lower   [name] Type Chronic [name]   Aggravating Factors  walking and sitting;  other body areas that hurt aggravate my back ;  muscle contraction causes it to sting   [name] Relieving Factors better in the morning            [facility] PT Assessment - 05/17/16 0001      Observation/Other Assessments   Focus on Therapeutic Outcomes (FOTO)  do next visit     AROM   Overall AROM Comments Formal measurements not taken secondary to fearfulness of movement      Strength   Overall Strength Comments deficits in transverse abdominal, lumbar paraspinals 3+/5, gluteus medius 4/5     Palpation   Palpation comment palpable tenderness over bil lumbar paraspinals and SI joint.  Surgical incision is well healed.  [facility] PT Treatment/Exercise - 05/17/16 0001      Lumbar Exercises: Seated   Long Arc Quad on Chair AROM;Right;Left;5 reps     Lumbar Exercises: Supine   Ab Set 5 reps   Clam 5 reps   Clam Limitations with ab brace each leg   Straight Leg Raises Limitations Decompression ex's per HEP  5 reps each   Isometric Hip Flexion 5 reps                PT Education - 05/17/16 1700    Education provided Yes   Education Details supine decompression ex; supine abdominal brace;  using senses and imagery to densensitize brain with activity and exercise          PT Short Term Goals - 05/17/16 1823      PT SHORT TERM GOAL #1   Title be independent in initial HEP   Status Achieved     PT SHORT TERM GOAL #2   Title demonstrate and verbalize correct body mechanics modifications for home tasks and self-care to protect spine   Status Achieved     PT SHORT TERM GOAL #3   Title report a 30% reduction in LBP with sitting and standing >10 minutes   Time 4   Period Weeks   Status  On-going     PT SHORT TERM GOAL #4   Title sit and stand for 15 minutes without limitation   Period Weeks   Status On-going           PT Long Term Goals - 05/17/16 1824      PT LONG TERM GOAL #1   Title be independent in advanced HEP   Time 8   Period Weeks   Status On-going     PT LONG TERM GOAL #2   Title report a 50% reduction in LBP with sitting and standing > 20 minutes   Time 8   Period Weeks   Status On-going     PT LONG TERM GOAL #3   Title sit and stand for 30 minutes without limitation   Time 8   Period Weeks   Status On-going     PT LONG TERM GOAL #4   Title resume regular walking program and walk for 20 minutes without limitation due to LBP   Period Weeks   Status On-going               Plan - 05/17/16 1704    Clinical Impression Statement The patient returns after lapse in care since 03/31/16.  She reports she had an increase in [name] and wanted to make sure nothing was wrong before continuing.  She has seen the doctor and had x-rays and MRI which the patient reports "showed nothing was wrong."   She reports the doctor felt "her nerve had been inflammed."   She states she has had a lot of anxiety about her condition but has been feeling some better in the last 2 weeks and has resumed a few of her exercises.  Her fearfulness of physical activity may be a limiting factor.   We have intitiated neuroscience of [name] education and we will need to progress slowly with recovery of function secondary to her fearfulness and central [name] sensitization.  She is able to participate in supine decompression ex, supine abdominal brace and neural flossing without [name] exacerbation.  Therapist closely monitoring response throughout.  Decrease treatment frequency to 1x/week.     Rehab Potential Good   PT  Frequency 1x / week   PT Duration 8 weeks   PT Treatment/Interventions ADLs/Self Care Home Management;Cryotherapy;Electrical Stimulation;Functional mobility  training;Ultrasound;Therapeutic activities;Therapeutic exercise;Neuromuscular re-education;Patient/family education;Passive range of motion;Manual techniques;Dry needling;Taping   PT Next Visit Plan do FOTO for re-assessment purposes;  low level  core strengthening;  [name] education   Recommended Other Services encouraged patient to use her home TENS;  encouraged her to consider aquatic ex      Patient will benefit from skilled therapeutic intervention in order to improve the following deficits and impairments:  Postural dysfunction, Decreased strength, Impaired flexibility, Improper body mechanics, [name], Decreased activity tolerance, Decreased endurance, Increased muscle spasms, Decreased range of motion  Visit Diagnosis: 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 Lumbar stenosis with neurogenic claudication 10/05/2015   Lavinia SharpsStacy Simpson, PT 05/17/16 6:31 PM Phone: [phone] Fax: [phone] Vivien PrestoSimpson, Stacy C 05/17/2016, 6:28 PM  [facility] [address] [place], [place], [address] Phone: [phone]   Fax:  [phone]  Name: Brittany PainBarbara S [name] MRN: [id number] Date of Birth: [birth year]

## 2016-05-17 NOTE — Patient Instructions (Signed)
  RE-ALIGNMENT ROUTINE EXERCISES BASIC FOR POSTURAL CORRECTION   RE-ALIGNMENT Tips BENEFITS: 1.It helps to re-align the curves of the back and improve standing posture. 2.It allows the back muscles to rest and strengthen in preparation for more activity. FREQUENCY: Daily, even after weeks, months and years of more advanced exercises. START: 1.All exercises start in the same position: lying on the back, arms resting on the supporting surface, palms up and slightly away from the body, backs of hands down, knees bent, feet flat. 2.The head, neck, arms, and legs are supported according to specific instructions of your therapist. Copyright  VHI. All rights reserved.    1. Decompression Exercise: Basic.   Takes compression off the vertebral bodies; increases tolerance for lying on the back; helps relieve back pain   Lie on back on firm surface, knees bent, feet flat, arms turned up, out to sides (~35 degrees). Head neck and arms supported as necessary. Time _5-15__ minutes. Surface: floor     2. Shoulder Press  Strengthens upper back extensors and scapular retractors.   Press both shoulders down. Hold _2-3__ seconds. Repeat _3-5__ times. Surface: floor        3. Head Press With Chin Tuck  Strengthens neck extensors   Tuck chin SLIGHTLY toward chest, keep mouth closed. Feel weight on back of head. Increase weight by pressing head down. Hold _2-3__ seconds. Relax. Repeat 3-5___ times. Surface: floor   4. Leg Lengthener: stretches quadratus lumborum and hip flexors.  Strengthens quads and ankle dorsiflexors.  5.  Press whole leg down into the bed 5 sec hold 5x.              Lavinia SharpsStacy Simpson PT [facility] [address], [address] [place], [place] [address] Phone # [phone] Fax [phone]

## 2016-05-23 ENCOUNTER — Ambulatory Visit: Payer: Medicare Other | Admitting: Physical Therapy

## 2016-05-23 ENCOUNTER — Encounter: Payer: Self-pay | Admitting: Physical Therapy

## 2016-05-23 DIAGNOSIS — M545 Low back pain, unspecified: Secondary | ICD-10-CM

## 2016-05-23 DIAGNOSIS — M25651 Stiffness of right hip, not elsewhere classified: Secondary | ICD-10-CM

## 2016-05-23 DIAGNOSIS — M25652 Stiffness of left hip, not elsewhere classified: Secondary | ICD-10-CM

## 2016-05-23 DIAGNOSIS — G8929 Other chronic pain: Secondary | ICD-10-CM

## 2016-05-23 DIAGNOSIS — R293 Abnormal posture: Secondary | ICD-10-CM

## 2016-05-23 NOTE — Therapy (Signed)
[facility] [address], [address] [place], [place], [address] Phone: [phone]   Fax:  [phone]  Physical Therapy Treatment  Patient Details  Name: Brittany [name] MRN: [id number] Date of Birth: Oct 06, 1945 Referring Provider: Shirlean KellyNudelman, Robert, MD  Encounter Date: 05/23/2016      PT End of Session - 05/23/16 1627    Visit Number 14   Number of Visits 20   Date for PT Re-Evaluation 07/12/16   Authorization Type G codes;  KX at visit 15   PT Start Time 1533   PT Stop Time 1627   PT Time Calculation (min) 54 min   Activity Tolerance Patient limited by [name]   Behavior During Therapy [facility] for tasks assessed/performed      Past Medical History:  Diagnosis Date  . Anxiety   . Arthritis   . Degenerative spondylolisthesis    L4-5, grade 1  . Depression   . HLD (hyperlipidemia)   . Hypercholesteremia   . Left lumbar radiculopathy   . Lumbago   . Lumbar degenerative disc disease   . Lumbar spondylosis   . Lumbar stenosis with neurogenic claudication    L4-5  . Osteoporosis   . Post-nasal drip   . Slow transit constipation   . Spinal stenosis   . Vitamin D deficiency   . Weakness of both lower extremities     Past Surgical History:  Procedure Laterality Date  . EYE SURGERY    . TONSILLECTOMY      There were no vitals filed for this visit.      Subjective Assessment - 05/23/16 1626    Subjective Pt reports [name] was worse yesterday but a little better today. Pt continues to have sensitivity and [name] in low back due to inflammed nerve. Pt is very cautious with all bending activities believing that bending aggrivates the nerve.     Pertinent History Lumbar fusion 09/2015, Lt LE symptoms prior to surgery   Limitations Sitting;Standing;Walking   How long can you stand comfortably? 10 minutes max   How long can you walk comfortably? <10 minutes   Diagnostic tests x-ray 02/2016: negative   Patient Stated Goals  return to regular walking for exercise (20 minutes), return to regular activity   Currently in [name]? Yes   [name] Score 2    [name] Location Back   [name] Orientation Left;Lower   [name] Descriptors / Indicators Aching   [name] Type Chronic [name]   [name] Onset More than a month ago   [name] Frequency Intermittent            [facility] PT Assessment - 05/23/16 0001      Observation/Other Assessments   Focus on Therapeutic Outcomes (FOTO)  56% limitation                     [facility] Adult PT Treatment/Exercise - 05/23/16 0001      Lumbar Exercises: Stretches   Active Hamstring Stretch 3 reps;30 seconds   Piriformis Stretch 2 reps;10 seconds     Lumbar Exercises: Standing   Row Strengthening;Both;10 reps;Theraband  Seated   Theraband Level (Row) Level 2 (Red)   Shoulder Extension Strengthening;Both;10 reps;Theraband  Seated   Theraband Level (Shoulder Extension) Level 2 (Red)     Lumbar Exercises: Seated   Long Arc Quad on Chair AROM;Right;Left;5 reps     Knee/Hip Exercises: Stretches   Theme park managerGastroc Stretch Both;2 reps;10 seconds     Knee/Hip Exercises: Aerobic   Nustep L1  x 3 minutes  Pt reporting [name] after 3 minutes     Knee/Hip Exercises: Standing   Other Standing Knee Exercises Seated diagonals and front raises  Red tball                  PT Short Term Goals - 05/23/16 1540      PT SHORT TERM GOAL #3   Title report a 30% reduction in LBP with sitting and standing >10 minutes   Time 4   Period Weeks     PT SHORT TERM GOAL #4   Title sit and stand for 15 minutes without limitation   Time 4   Period Weeks   Status On-going           PT Long Term Goals - 05/23/16 1548      PT LONG TERM GOAL #1   Title be independent in advanced HEP   Time 8   Period Weeks   Status On-going     PT LONG TERM GOAL #2   Title report a 50% reduction in LBP with sitting and standing > 20 minutes   Time 8   Period Weeks   Status On-going     PT LONG TERM GOAL #3    Title sit and stand for 30 minutes without limitation   Time 8   Period Weeks   Status On-going     PT LONG TERM GOAL #4   Title resume regular walking program and walk for 20 minutes without limitation due to LBP   Time 8   Period Weeks   Status On-going               Plan - 05/23/16 1628    Clinical Impression Statement Pt reports continued [name] in low back due to inflammed nerve that is sensitive to the touch. Pt able to complete all seated strengthening and stretching exercises needing verbal cueing to use core and good technique. Pt reports she is planning to do aquatic therapy but just has not gone yet. Pt will continue to benefit from skilled therapy for core strengthening, stability, and flexibility.    Rehab Potential Good   PT Frequency 1x / week   PT Duration 8 weeks   PT Treatment/Interventions ADLs/Self Care Home Management;Cryotherapy;Electrical Stimulation;Functional mobility training;Ultrasound;Therapeutic activities;Therapeutic exercise;Neuromuscular re-education;Patient/family education;Passive range of motion;Manual techniques;Dry needling;Taping   PT Next Visit Plan low level core strengthening and flexibility.    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 Lumbar stenosis with neurogenic claudication 10/05/2015    Dessa PhiKatherine Matthews PTA 05/23/2016, 4:34 PM  [facility] [address], [address] [place], [place], [address] Phone: [phone]   Fax:  [phone]  Name: Brittany PainBarbara S Cannell MRN: [id number] Date of Birth: [birth year]

## 2016-05-30 ENCOUNTER — Encounter: Payer: Self-pay | Admitting: Physical Therapy

## 2016-05-30 ENCOUNTER — Ambulatory Visit: Payer: Medicare Other | Admitting: Physical Therapy

## 2016-05-30 DIAGNOSIS — M545 Low back pain, unspecified: Secondary | ICD-10-CM

## 2016-05-30 DIAGNOSIS — R293 Abnormal posture: Secondary | ICD-10-CM

## 2016-05-30 DIAGNOSIS — G8929 Other chronic pain: Secondary | ICD-10-CM

## 2016-05-30 DIAGNOSIS — M25652 Stiffness of left hip, not elsewhere classified: Secondary | ICD-10-CM

## 2016-05-30 DIAGNOSIS — M25651 Stiffness of right hip, not elsewhere classified: Secondary | ICD-10-CM

## 2016-05-30 NOTE — Therapy (Signed)
[facility] [address] [place], [place], [address] Phone: [phone]   Fax:  [phone]  Physical Therapy Treatment  Patient Details  Name: Brittany [name] MRN: [id number] Date of Birth: 1945-12-24 Referring Provider: Shirlean KellyNudelman, Robert, MD  Encounter Date: 05/30/2016      PT End of Session - 05/30/16 1458    Visit Number 15   Number of Visits 20   Date for PT Re-Evaluation 07/12/16   Authorization Type G codes;  KX at visit 15   PT Start Time 1447   PT Stop Time 1540   PT Time Calculation (min) 53 min   Activity Tolerance Patient limited by pain   Behavior During Therapy [facility] for tasks assessed/performed      Past Medical History:  Diagnosis Date  . Anxiety   . Arthritis   . Degenerative spondylolisthesis    L4-5, grade 1  . Depression   . HLD (hyperlipidemia)   . Hypercholesteremia   . Left lumbar radiculopathy   . Lumbago   . Lumbar degenerative disc disease   . Lumbar spondylosis   . Lumbar stenosis with neurogenic claudication    L4-5  . Osteoporosis   . Post-nasal drip   . Slow transit constipation   . Spinal stenosis   . Vitamin D deficiency   . Weakness of both lower extremities     Past Surgical History:  Procedure Laterality Date  . EYE SURGERY    . TONSILLECTOMY      There were no vitals filed for this visit.      Subjective Assessment - 05/30/16 1456    Subjective Pt reports increased pain today after prolonged sitting over the weekend. Pt is very frustrated that pain continues to return. Reports feeling "stingy".   Pertinent History Lumbar fusion 09/2015, Lt LE symptoms prior to surgery   Limitations Sitting;Standing;Walking   How long can you stand comfortably? 10 minutes max   How long can you walk comfortably? <10 minutes   Diagnostic tests x-ray 02/2016: negative   Patient Stated Goals return to regular walking for exercise (20 minutes), return to regular activity   Currently in Pain? Yes   Pain Score 3    Pain Location Back   Pain Orientation Left;Lower   Pain Descriptors / Indicators Aching   Pain Type Chronic pain   Pain Onset More than a month ago   Pain Frequency Intermittent   Aggravating Factors  Walking and sitting                         [facility] Adult PT Treatment/Exercise - 05/30/16 0001      Lumbar Exercises: Stretches   Active Hamstring Stretch 3 reps;30 seconds   Piriformis Stretch 2 reps;10 seconds     Lumbar Exercises: Standing   Row Strengthening;Both;10 reps;Theraband  Seated   Theraband Level (Row) Level 2 (Red)   Shoulder Extension Strengthening;Both;10 reps;Theraband  Seated   Theraband Level (Shoulder Extension) Level 2 (Red)     Lumbar Exercises: Supine   Heel Slides 20 reps   Bent Knee Raise 20 reps   Straight Leg Raises Limitations Decompression ex's per HEP  5 reps each   Isometric Hip Flexion 5 reps   Other Supine Lumbar Exercises Red ball diagonals  red ball supine     Knee/Hip Exercises: Standing   Rebounder Weight shifting 3 ways x1 minute each     Moist Heat Therapy   Number Minutes  Moist Heat 15 Minutes   Moist Heat Location Lumbar Spine     Electrical Stimulation   Electrical Stimulation Location Lumbar spine   Electrical Stimulation Action IFC   Electrical Stimulation Parameters to tolerance   Electrical Stimulation Goals Pain                  PT Short Term Goals - 05/30/16 1459      PT SHORT TERM GOAL #3   Title report a 30% reduction in LBP with sitting and standing >10 minutes   Time 4   Period Weeks   Status On-going     PT SHORT TERM GOAL #4   Title sit and stand for 15 minutes without limitation   Time 4   Period Weeks   Status On-going           PT Long Term Goals - 05/30/16 1459      PT LONG TERM GOAL #2   Title report a 50% reduction in LBP with sitting and standing > 20 minutes   Time 8   Period Weeks   Status On-going     PT LONG TERM  GOAL #3   Title sit and stand for 30 minutes without limitation   Time 8   Period Weeks   Status On-going     PT LONG TERM GOAL #4   Title resume regular walking program and walk for 20 minutes without limitation due to LBP   Time 8   Period Weeks   Status On-going               Plan - 05/30/16 1506    Clinical Impression Statement Pt having increased pain today after prolonged sitting over the weekend. Pt unable to tolerate increased standing exercises today.    Rehab Potential Good   PT Frequency 1x / week   PT Duration 8 weeks   PT Treatment/Interventions ADLs/Self Care Home Management;Cryotherapy;Electrical Stimulation;Functional mobility training;Ultrasound;Therapeutic activities;Therapeutic exercise;Neuromuscular re-education;Patient/family education;Passive range of motion;Manual techniques;Dry needling;Taping   PT Next Visit Plan low level core strengthening and flexibility.    Consulted and Agree with Plan of Care Patient      Patient will benefit from skilled therapeutic intervention in order to improve the following deficits and impairments:  Postural dysfunction, Decreased strength, Impaired flexibility, Improper body mechanics, Pain, Decreased activity tolerance, Decreased endurance, Increased muscle spasms, Decreased range of motion  Visit Diagnosis: Chronic left-sided low back pain without sciatica  Abnormal posture  Stiffness of left hip, not elsewhere classified  Stiffness of right hip, not elsewhere classified     Problem List Patient Active Problem List   Diagnosis Date Noted  . Lumbar stenosis with neurogenic claudication 10/05/2015    Dessa PhiKatherine Sherree Shankman PTA 05/30/2016, 4:12 PM  [facility] [address] [place], [place], [address] Phone: [phone]   Fax:  [phone]  Name: Brittany [name] MRN: [id number] Date of Birth: [birth year]

## 2016-06-06 ENCOUNTER — Encounter: Payer: Self-pay | Admitting: Physical Therapy

## 2016-06-06 ENCOUNTER — Ambulatory Visit: Payer: Medicare Other | Admitting: Physical Therapy

## 2016-06-06 DIAGNOSIS — R293 Abnormal posture: Secondary | ICD-10-CM

## 2016-06-06 DIAGNOSIS — M545 Low back pain: Principal | ICD-10-CM

## 2016-06-06 DIAGNOSIS — G8929 Other chronic pain: Secondary | ICD-10-CM

## 2016-06-06 NOTE — Therapy (Signed)
[facility] [address], [place], [place], [address] Phone: [phone]   Fax:  [phone]  Physical Therapy Treatment  Patient Details  Name: Brittany [name] MRN: [id number] Date of Birth: [birth year] Referring Provider: Jovita Gamma, MD  Encounter Date: 06/06/2016      PT End of Session - 06/06/16 1404    Visit Number 16   Number of Visits 20   Date for PT Re-Evaluation July 13, 2016   Authorization Type G codes;  KX at visit 15   PT Start Time 1400   PT Stop Time 1453   PT Time Calculation (min) 53 min   Activity Tolerance Patient limited by pain   Behavior During Therapy [facility] for tasks assessed/performed      Past Medical History:  Diagnosis Date  . Anxiety   . Arthritis   . Degenerative spondylolisthesis    L4-5, grade 1  . Depression   . HLD (hyperlipidemia)   . Hypercholesteremia   . Left lumbar radiculopathy   . Lumbago   . Lumbar degenerative disc disease   . Lumbar spondylosis   . Lumbar stenosis with neurogenic claudication    L4-5  . Osteoporosis   . Post-nasal drip   . Slow transit constipation   . Spinal stenosis   . Vitamin D deficiency   . Weakness of both lower extremities     Past Surgical History:  Procedure Laterality Date  . EYE SURGERY    . TONSILLECTOMY      There were no vitals filed for this visit.      Subjective Assessment - 06/06/16 1402    Subjective Pt reports feeling ok today. Is happy that back is feeling good but is already dreading when pain will return. Pt stated she is trying to rest for about 30 minutes before doing activities which is helping to prevent pain.    Pertinent History Lumbar fusion 09/2015, Lt LE symptoms prior to surgery   Limitations Sitting;Standing;Walking   Currently in Pain? Yes   Pain Score 1    Pain Location Back   Pain Orientation Left;Lower   Pain Descriptors / Indicators Aching   Pain Type Chronic pain   Pain Onset More than  a month ago   Pain Frequency Intermittent   Aggravating Factors  Walking and sitting   Pain Relieving Factors better in the morning   Multiple Pain Sites No                         [facility] Adult PT Treatment/Exercise - 06/06/16 0001      Lumbar Exercises: Stretches   Single Knee to Chest Stretch 2 reps;10 seconds   Piriformis Stretch 2 reps;10 seconds     Lumbar Exercises: Standing   Row Strengthening;Both;10 reps;Theraband  Seated   Theraband Level (Row) Level 2 (Red)   Shoulder Extension Strengthening;Both;10 reps;Theraband  Seated   Theraband Level (Shoulder Extension) Level 2 (Red)     Lumbar Exercises: Seated   Sit to Stand 20 reps  verbal cues for core      Lumbar Exercises: Supine   Ab Set 20 reps  Red ball   Heel Slides 20 reps  #2   Bent Knee Raise 20 reps  #2   Isometric Hip Flexion 10 reps     Knee/Hip Exercises: Aerobic   Nustep L1 x 5 minutes  Therapist present to discuss treatment     Knee/Hip Exercises: Standing   Hip Flexion  Stengthening;Both;1 set;10 reps   Hip ADduction Strengthening;Both;1 set;10 reps   Hip Abduction Stengthening;Both;1 set;10 reps   Rebounder Weight shifting 3 ways x1 minute each                  PT Short Term Goals - 06/06/16 1404      PT SHORT TERM GOAL #3   Title report a 30% reduction in LBP with sitting and standing >10 minutes   Time 4   Period Weeks   Status Partially Met  25%     PT SHORT TERM GOAL #4   Title sit and stand for 15 minutes without limitation   Time 4   Period Weeks   Status Achieved           PT Long Term Goals - 06/06/16 1414      PT LONG TERM GOAL #1   Title be independent in advanced HEP   Time 8   Period Weeks   Status On-going     PT LONG TERM GOAL #2   Title report a 50% reduction in LBP with sitting and standing > 20 minutes   Time 8   Period Weeks   Status On-going     PT LONG TERM GOAL #3   Title sit and stand for 30 minutes without limitation    Time 8   Period Weeks   Status On-going     PT LONG TERM GOAL #4   Title resume regular walking program and walk for 20 minutes without limitation due to LBP   Time 8   Period Weeks   Status On-going               Plan - 06/06/16 1546    Clinical Impression Statement Pt reports less back pain today, feeling ok. Pt able to tolerate all standing exercises well and able to tolerate increased reisstance with mat exercises. Pt will continue to benefit from skilled therapy for core strenghtening and LE flexibility.    Rehab Potential Good   PT Frequency 1x / week   PT Duration 8 weeks   PT Treatment/Interventions ADLs/Self Care Home Management;Cryotherapy;Electrical Stimulation;Functional mobility training;Ultrasound;Therapeutic activities;Therapeutic exercise;Neuromuscular re-education;Patient/family education;Passive range of motion;Manual techniques;Dry needling;Taping   PT Next Visit Plan low level core strengthening and flexibility.    Consulted and Agree with Plan of Care Patient      Patient will benefit from skilled therapeutic intervention in order to improve the following deficits and impairments:  Postural dysfunction, Decreased strength, Impaired flexibility, Improper body mechanics, Pain, Decreased activity tolerance, Decreased endurance, Increased muscle spasms, Decreased range of motion  Visit Diagnosis: Chronic left-sided low back pain without sciatica  Abnormal posture     Problem List Patient Active Problem List   Diagnosis Date Noted  . Lumbar stenosis with neurogenic claudication 10/05/2015    Mikle Bosworth PTA 06/06/2016, 4:04 PM  [facility] [address], [place], [place], [address] Phone: [phone]   Fax:  [phone]  Name: Brittany [name] MRN: [id number] Date of Birth: 1945/08/04

## 2016-06-15 ENCOUNTER — Encounter: Payer: Self-pay | Admitting: Physical Therapy

## 2016-06-15 ENCOUNTER — Ambulatory Visit: Payer: Medicare Other | Attending: Neurosurgery | Admitting: Physical Therapy

## 2016-06-15 DIAGNOSIS — M25652 Stiffness of left hip, not elsewhere classified: Secondary | ICD-10-CM | POA: Insufficient documentation

## 2016-06-15 DIAGNOSIS — M25651 Stiffness of right hip, not elsewhere classified: Secondary | ICD-10-CM | POA: Insufficient documentation

## 2016-06-15 DIAGNOSIS — M545 Low back pain, unspecified: Secondary | ICD-10-CM

## 2016-06-15 DIAGNOSIS — R293 Abnormal posture: Secondary | ICD-10-CM

## 2016-06-15 DIAGNOSIS — G8929 Other chronic pain: Secondary | ICD-10-CM | POA: Diagnosis present

## 2016-06-15 NOTE — Therapy (Signed)
[facility] [address] [place], [place], [address] Phone: [phone]   Fax:  [phone]  Physical Therapy Treatment  Patient Details  Name: Brittany [name] MRN: [id number] Date of Birth: Jan 16, 1946 Referring Provider: Jovita Gamma, MD  Encounter Date: 06/15/2016      PT End of Session - 06/15/16 1403    Visit Number 17   Number of Visits 20   Date for PT Re-Evaluation 2016-07-24   Authorization Type G codes;  KX at visit 15   PT Start Time 1400   PT Stop Time 1450   PT Time Calculation (min) 50 min   Activity Tolerance Patient limited by pain   Behavior During Therapy [facility] for tasks assessed/performed      Past Medical History:  Diagnosis Date  . Anxiety   . Arthritis   . Degenerative spondylolisthesis    L4-5, grade 1  . Depression   . HLD (hyperlipidemia)   . Hypercholesteremia   . Left lumbar radiculopathy   . Lumbago   . Lumbar degenerative disc disease   . Lumbar spondylosis   . Lumbar stenosis with neurogenic claudication    L4-5  . Osteoporosis   . Post-nasal drip   . Slow transit constipation   . Spinal stenosis   . Vitamin D deficiency   . Weakness of both lower extremities     Past Surgical History:  Procedure Laterality Date  . EYE SURGERY    . TONSILLECTOMY      There were no vitals filed for this visit.      Subjective Assessment - 06/15/16 1402    Subjective Pt reports feeling ok today. Having a little increased pain due to sitting without pillow for a littel while this morning.    Pertinent History Lumbar fusion 09/2015, Lt LE symptoms prior to surgery   Limitations Sitting;Standing;Walking   How long can you stand comfortably? 10 minutes max   How long can you walk comfortably? <10 minutes   Diagnostic tests x-ray 02/2016: negative   Patient Stated Goals return to regular walking for exercise (20 minutes), return to regular activity   Currently in Pain? Yes   Pain  Score 2    Pain Location Back   Pain Orientation Left;Lower   Pain Descriptors / Indicators Aching   Pain Type Chronic pain   Pain Frequency Intermittent                         [facility] Adult PT Treatment/Exercise - 06/15/16 0001      Lumbar Exercises: Stretches   Active Hamstring Stretch 3 reps;30 seconds   Single Knee to Chest Stretch 2 reps;10 seconds   Piriformis Stretch 2 reps;10 seconds     Lumbar Exercises: Seated   Long Arc Quad on Chair --  Red ball diagonals and front raises 2x10   Sit to Stand 20 reps  verbal cues for core      Lumbar Exercises: Supine   Ab Set 20 reps  Red ball   Bent Knee Raise 20 reps  feet on red ball   Isometric Hip Flexion --  Ball squeeze 20x 5 second hold     Knee/Hip Exercises: Aerobic   Nustep L2 x 5 minutes  Therapist present to discuss treatment     Knee/Hip Exercises: Standing   Hip Flexion Stengthening;Both;10 reps;2 sets   Hip ADduction Strengthening;Both;10 reps;2 sets   Hip Abduction Stengthening;Both;10 reps;2 sets   Forward  Step Up Both;2 sets;10 reps;Step Height: 6"   Rebounder Marching, Weight shifting 3 ways x1 minute each     Moist Heat Therapy   Number Minutes Moist Heat 15 Minutes   Moist Heat Location Lumbar Spine     Electrical Stimulation   Electrical Stimulation Location Lumbar spine   Electrical Stimulation Action IFC   Electrical Stimulation Parameters To tolerance   Electrical Stimulation Goals Pain                  PT Short Term Goals - 06/15/16 1415      PT SHORT TERM GOAL #3   Title report a 30% reduction in LBP with sitting and standing >10 minutes   Time 4   Period Weeks   Status Partially Met           PT Long Term Goals - 06/06/16 1414      PT LONG TERM GOAL #1   Title be independent in advanced HEP   Time 8   Period Weeks   Status On-going     PT LONG TERM GOAL #2   Title report a 50% reduction in LBP with sitting and standing > 20 minutes   Time 8    Period Weeks   Status On-going     PT LONG TERM GOAL #3   Title sit and stand for 30 minutes without limitation   Time 8   Period Weeks   Status On-going     PT LONG TERM GOAL #4   Title resume regular walking program and walk for 20 minutes without limitation due to LBP   Time 8   Period Weeks   Status On-going               Plan - 06/15/16 1412    Clinical Impression Statement Pt reports back doing ok today. Pt able to tolerate all standing exercises well with no increase in pain. Pt will continue to benefit from skilled therapy for core strength and LE stability.    Rehab Potential Good   PT Frequency 1x / week   PT Duration 8 weeks   PT Treatment/Interventions ADLs/Self Care Home Management;Cryotherapy;Electrical Stimulation;Functional mobility training;Ultrasound;Therapeutic activities;Therapeutic exercise;Neuromuscular re-education;Patient/family education;Passive range of motion;Manual techniques;Dry needling;Taping   PT Next Visit Plan low level core strengthening and flexibility.    Consulted and Agree with Plan of Care Patient      Patient will benefit from skilled therapeutic intervention in order to improve the following deficits and impairments:  Postural dysfunction, Decreased strength, Impaired flexibility, Improper body mechanics, Pain, Decreased activity tolerance, Decreased endurance, Increased muscle spasms, Decreased range of motion  Visit Diagnosis: Chronic left-sided low back pain without sciatica  Abnormal posture     Problem List Patient Active Problem List   Diagnosis Date Noted  . Lumbar stenosis with neurogenic claudication 10/05/2015    Mikle Bosworth PTA 06/15/2016, 2:37 PM  [facility] [address] [place], [place], [address] Phone: [phone]   Fax:  [phone]  Name: Brittany [name] MRN: [id number] Date of Birth: 1945-09-17

## 2016-06-20 ENCOUNTER — Ambulatory Visit: Payer: Medicare Other | Admitting: Physical Therapy

## 2016-06-20 ENCOUNTER — Encounter: Payer: Self-pay | Admitting: Physical Therapy

## 2016-06-20 DIAGNOSIS — R293 Abnormal posture: Secondary | ICD-10-CM

## 2016-06-20 DIAGNOSIS — G8929 Other chronic pain: Secondary | ICD-10-CM

## 2016-06-20 DIAGNOSIS — M545 Low back pain: Principal | ICD-10-CM

## 2016-06-20 NOTE — Therapy (Signed)
[facility] [address], [place], [place], [address] Phone: [phone]   Fax:  [phone]  Physical Therapy Treatment  Patient Details  Name: Brittany [name] MRN: [id number] Date of Birth: Apr 03, 1946 Referring Provider: Jovita Gamma, MD  Encounter Date: 06/20/2016      PT End of Session - 06/20/16 1423    Visit Number 18   Number of Visits 20   Date for PT Re-Evaluation 29-Jul-2016   Authorization Type G codes;  KX at visit 15   PT Start Time 1408   PT Stop Time 1450   PT Time Calculation (min) 42 min   Activity Tolerance Patient limited by pain   Behavior During Therapy [facility] for tasks assessed/performed      Past Medical History:  Diagnosis Date  . Anxiety   . Arthritis   . Degenerative spondylolisthesis    L4-5, grade 1  . Depression   . HLD (hyperlipidemia)   . Hypercholesteremia   . Left lumbar radiculopathy   . Lumbago   . Lumbar degenerative disc disease   . Lumbar spondylosis   . Lumbar stenosis with neurogenic claudication    L4-5  . Osteoporosis   . Post-nasal drip   . Slow transit constipation   . Spinal stenosis   . Vitamin D deficiency   . Weakness of both lower extremities     Past Surgical History:  Procedure Laterality Date  . EYE SURGERY    . TONSILLECTOMY      There were no vitals filed for this visit.      Subjective Assessment - 06/20/16 1412    Subjective Pt reports having increased pain today after leaning backwards while putting shoes on.    Pertinent History Lumbar fusion 09/2015, Lt LE symptoms prior to surgery   Limitations Sitting;Standing;Walking   How long can you stand comfortably? 10 minutes max   How long can you walk comfortably? <10 minutes   Diagnostic tests x-ray 02/2016: negative   Patient Stated Goals return to regular walking for exercise (20 minutes), return to regular activity   Currently in Pain? Yes   Pain Score 3    Pain Location Back   Pain Orientation Left;Lower   Pain Descriptors / Indicators Aching   Pain Type Chronic pain   Pain Onset More than a month ago   Pain Frequency Intermittent                         [facility] Adult PT Treatment/Exercise - 06/20/16 0001      Lumbar Exercises: Stretches   Active Hamstring Stretch 3 reps;30 seconds   Single Knee to Chest Stretch 2 reps;10 seconds   Piriformis Stretch 2 reps;10 seconds     Lumbar Exercises: Seated   Long Arc Quad on Chair Strengthening;Both;2 sets;10 reps  #2   Hip Flexion on Ball Strengthening;Both;20 reps  #2   Sit to Stand 20 reps  verbal cues for core      Lumbar Exercises: Supine   Ab Set 20 reps  Red ball   Bent Knee Raise 20 reps  feet on red ball     Knee/Hip Exercises: Aerobic   Nustep L2 x 5 minutes   Therapist present to discuss treatment     Knee/Hip Exercises: Standing   Heel Raises Both;20 reps   Hip Flexion Stengthening;Both;10 reps;2 sets   Hip Abduction Stengthening;Both;10 reps;2 sets   Hip Extension Stengthening;Both;2 sets;10 reps   Forward  Step Up Both;2 sets;10 reps;Step Height: 6"   Rebounder Marching, Weight shifting 3 ways x1 minute each     Moist Heat Therapy   Number Minutes Moist Heat 15 Minutes   Moist Heat Location Lumbar Spine     Electrical Stimulation   Electrical Stimulation Location Lumbar spine   Electrical Stimulation Action IFC   Electrical Stimulation Parameters To tolerance   Electrical Stimulation Goals Pain                  PT Short Term Goals - 06/20/16 1420      PT SHORT TERM GOAL #3   Title report a 30% reduction in LBP with sitting and standing >10 minutes   Time 4   Period Weeks   Status Partially Met           PT Long Term Goals - 06/20/16 1420      PT LONG TERM GOAL #1   Title be independent in advanced HEP   Time 8   Period Weeks   Status On-going     PT LONG TERM GOAL #2   Title report a 50% reduction in LBP with sitting and standing > 20  minutes   Time 8   Period Weeks   Status On-going     PT LONG TERM GOAL #3   Title sit and stand for 30 minutes without limitation   Time 8   Period Weeks   Status On-going     PT LONG TERM GOAL #4   Title resume regular walking program and walk for 20 minutes without limitation due to LBP   Time 8   Period Weeks   Status On-going               Plan - 06/20/16 1432    Clinical Impression Statement Pt reports having set back after extending back while outting shoes on. Pt able to tolerate all exercises well today with no increase in pain. Pt continues to progress towards long term goals. Has more decreased toelrance for standing and walking than with sitting. Pt encouraged to look into water aerobics class for exercise. Pt will continue to benfit from skilled therapy       Patient will benefit from skilled therapeutic intervention in order to improve the following deficits and impairments:     Visit Diagnosis: Chronic left-sided low back pain without sciatica  Abnormal posture     Problem List Patient Active Problem List   Diagnosis Date Noted  . Lumbar stenosis with neurogenic claudication 10/05/2015    Mikle Bosworth PTA 06/20/2016, 2:45 PM  [facility] [address], [place], [place], [address] Phone: [phone]   Fax:  [phone]  Name: Brittany [name] MRN: [id number] Date of Birth: 12/28/1945

## 2016-06-27 ENCOUNTER — Ambulatory Visit: Payer: Medicare Other

## 2016-06-27 DIAGNOSIS — M25651 Stiffness of right hip, not elsewhere classified: Secondary | ICD-10-CM

## 2016-06-27 DIAGNOSIS — M545 Low back pain, unspecified: Secondary | ICD-10-CM

## 2016-06-27 DIAGNOSIS — M25652 Stiffness of left hip, not elsewhere classified: Secondary | ICD-10-CM

## 2016-06-27 DIAGNOSIS — G8929 Other chronic pain: Secondary | ICD-10-CM

## 2016-06-27 DIAGNOSIS — R293 Abnormal posture: Secondary | ICD-10-CM

## 2016-06-27 NOTE — Therapy (Signed)
[facility] [address] [place], [place], [address] Phone: [phone]   Fax:  [phone]  Physical Therapy Treatment  Patient Details  Name: Brittany [name] MRN: [id number] Date of Birth: 09-13-1945 Referring Provider: Jovita Gamma, MD  Encounter Date: 06/27/2016      PT End of Session - 06/27/16 1450    Visit Number 75   PT Start Time 8891  pt late for appt   PT Stop Time 1500   PT Time Calculation (min) 48 min   Activity Tolerance Patient tolerated treatment well   Behavior During Therapy [facility] for tasks assessed/performed      Past Medical History:  Diagnosis Date  . Anxiety   . Arthritis   . Degenerative spondylolisthesis    L4-5, grade 1  . Depression   . HLD (hyperlipidemia)   . Hypercholesteremia   . Left lumbar radiculopathy   . Lumbago   . Lumbar degenerative disc disease   . Lumbar spondylosis   . Lumbar stenosis with neurogenic claudication    L4-5  . Osteoporosis   . Post-nasal drip   . Slow transit constipation   . Spinal stenosis   . Vitamin D deficiency   . Weakness of both lower extremities     Past Surgical History:  Procedure Laterality Date  . EYE SURGERY    . TONSILLECTOMY      There were no vitals filed for this visit.      Subjective Assessment - 06/27/16 1416    Subjective I feel like I have gotten a little better but not stable.     Currently in Pain? Yes   Pain Score 3    Pain Location Back   Pain Orientation Left   Pain Descriptors / Indicators Aching   Pain Type Chronic pain   Pain Onset More than a month ago   Pain Frequency Intermittent   Aggravating Factors  walking and sitting   Pain Relieving Factors better in the morning            [facility] PT Assessment - 06/27/16 0001      Assessment   Medical Diagnosis s/p lumbarfusion, arthrodesis status     Prior Function   Level of Independence Independent     Cognition   Overall Cognitive  Status Within Functional Limits for tasks assessed     Observation/Other Assessments   Focus on Therapeutic Outcomes (FOTO)  58% limitation                     [facility] Adult PT Treatment/Exercise - 06/27/16 0001      Lumbar Exercises: Stretches   Active Hamstring Stretch 3 reps;30 seconds   Single Knee to Chest Stretch 2 reps;10 seconds   Piriformis Stretch 2 reps;10 seconds     Lumbar Exercises: Seated   Long Arc Quad on Chair Strengthening;Both;2 sets;10 reps  2#   Hip Flexion on Ball Strengthening;Both;20 reps  2#   Sit to Stand 20 reps  verbal cues for core      Lumbar Exercises: Supine   Bent Knee Raise 20 reps  feet on red ball     Knee/Hip Exercises: Aerobic   Nustep L2 x 5 minutes   Therapist present to discuss treatment     Knee/Hip Exercises: Standing   Hip Flexion Stengthening;Both;10 reps;2 sets   Hip Abduction Stengthening;Both;10 reps;2 sets   Hip Extension Stengthening;Both;2 sets;10 reps     Moist Heat Therapy   Number  Minutes Moist Heat 15 Minutes   Moist Heat Location Lumbar Spine     Electrical Stimulation   Electrical Stimulation Location Lumbar spine   Electrical Stimulation Action IFC   Electrical Stimulation Parameters 15 minutes   Electrical Stimulation Goals Pain                  PT Short Term Goals - 06/20/16 1420      PT SHORT TERM GOAL #3   Title report a 30% reduction in LBP with sitting and standing >10 minutes   Time 4   Period Weeks   Status Partially Met           PT Long Term Goals - July 05, 2016 1421      PT LONG TERM GOAL #1   Title be independent in advanced HEP   Status Achieved     PT LONG TERM GOAL #2   Title report a 50% reduction in LBP with sitting and standing > 20 minutes   Status Partially Met     PT LONG TERM GOAL #3   Title sit and stand for 30 minutes without limitation   Status Achieved     PT LONG TERM GOAL #4   Title resume regular walking program and walk for 20 minutes  without limitation due to LBP   Status Not Met               Plan - 07-05-16 1423    Clinical Impression Statement Pt reports 10% overall improvment since the start of care.  FOTO is 58% limitation (62% at evaluation).  Pt has comprehensive HEP in place.  Pt is limited in standing and walking due to pain.  Pt will see MD at the end of January to follow-up   PT Next Visit Plan D/C PT to HEP today.   Consulted and Agree with Plan of Care Patient      Patient will benefit from skilled therapeutic intervention in order to improve the following deficits and impairments:     Visit Diagnosis: Chronic left-sided low back pain without sciatica  Abnormal posture  Stiffness of left hip, not elsewhere classified  Stiffness of right hip, not elsewhere classified       G-Codes - 05-Jul-2016 1428    Functional Assessment Tool Used FOTO: 58% limitation   Functional Limitation Other PT primary   Other PT Primary Goal Status (J4782) At least 40 percent but less than 60 percent impaired, limited or restricted   Other PT Primary Discharge Status (N5621) At least 40 percent but less than 60 percent impaired, limited or restricted      Problem List Patient Active Problem List   Diagnosis Date Noted  . Lumbar stenosis with neurogenic claudication 10/05/2015   PHYSICAL THERAPY DISCHARGE SUMMARY  Visits from Start of Care: 19  Current functional level related to goals / functional outcomes: PT attended 19 PT sessions and reports 10% improvement in symptoms overall.  See above for current PT status.     Remaining deficits: Lt sided lumbar pain that limits ability to stand and walk.  Pt had HEP in place for strength and flexibility.  Pt will follow-up with MD at the end of January to discuss progress.     Education / Equipment: HEP, posture/body mechanics Plan: Patient agrees to discharge.  Patient goals were partially met. Patient is being discharged due to being pleased with the  current functional level.  ?????        Sigurd Sos, PT  06/27/16 2:52 PM  [facility] [address] [place], [place], [address] Phone: [phone]   Fax:  [phone]  Name: Brittany [name] MRN: [id number] Date of Birth: Dec 20, 1945

## 2016-08-10 ENCOUNTER — Ambulatory Visit (INDEPENDENT_AMBULATORY_CARE_PROVIDER_SITE_OTHER): Payer: Medicare Other | Admitting: Neurology

## 2016-08-10 ENCOUNTER — Encounter: Payer: Self-pay | Admitting: Neurology

## 2016-08-10 VITALS — BP 122/68 | HR 81 | Ht 62.5 in | Wt 139.1 lb

## 2016-08-10 DIAGNOSIS — G8929 Other chronic pain: Secondary | ICD-10-CM

## 2016-08-10 DIAGNOSIS — M5136 Other intervertebral disc degeneration, lumbar region: Secondary | ICD-10-CM | POA: Diagnosis not present

## 2016-08-10 DIAGNOSIS — M5442 Lumbago with sciatica, left side: Secondary | ICD-10-CM | POA: Diagnosis not present

## 2016-08-10 MED ORDER — GABAPENTIN 100 MG PO CAPS: ORAL_CAPSULE | ORAL | 0 refills | Status: DC

## 2016-08-10 NOTE — Patient Instructions (Signed)
1.  We will start gabapentin 100mg  capsules:  Take 1 capsule at bedtime for 7 days  Then 1 capsule in morning and 1 capsule at bedtime for 7 days  Then 1 capsule three times daily (morning, noon/afternoon, and bedtime)  If pain not improved after two weeks on 1 capsule three times daily, contact me and we can increase dose.  Caution for dizziness 2.  Continue home exercises.  Do not overexert yourself, no heavy lifting, no hiking 3.  Continue tizanidine and antiinflammatories.  4.  Follow up in 3 months.

## 2016-08-10 NOTE — Progress Notes (Signed)
NEUROLOGY CONSULTATION NOTE  Brittany [name] MRN: [id number] DOB: 03-13-1946  Referring provider: Dr. Clelia Croft Primary care provider: Dr. Clelia Croft  Reason for consult:  Back pain  HISTORY OF PRESENT ILLNESS: Brittany [name] is a 71 year old female with osteoarthritis, progressive spinal stenosis with chronic back pain, neck pain, depression, anxiety and hyperlipidemia who presents for back pain.  History of symptoms supplemented by neurosurgery consult note.  She has longstanding history of progressive degenerative low back pain with lumbar stenosis with neurogenic claudication.  It would flare-up off and on over the years.  Last year, she developed another flareup with pain down the left leg.  MRI of lumbar spine from 05/12/15 was personally reviewed and revealed multilevel disc and facet degeneration with severe spinal stenosis at L4-5, progressed compared to prior study from 07/12/08.  She underwent L4-5 lumbar decompression and fusion on 10/05/15.  Post-surgical lumbar X-ray revealed L4-L5 interbody fusion with stable mild anterolisthesis.  She was doing well until late July when she overexerted herself on a camping/hiking trip.  She developed severe stinging pain with numbness and tingling down the left leg to the sole of her left foot.  Bending exacerbates it.  Laying down feels better.  She denies any weakness or bowel/bladder dysfunctionn.  She underwent physical therapy, spinal nerve root injections and multiple antiinflammatories (diclofenac, meloxicam, naproxen), as well as tramadol and muscle relaxants, which have only been minimally effective.  She had a repeat MRI of the lumbar spine performed by her neurosurgeon in October, which is not available to me but reportedly was okay.  Since October, she has had some improvement in the pain.  She never tried gabapentin or Lyrica.  Current medications for back pain include: Tizanidine 8mg  as needed at bedtime Tramadol 50mg  to 100mg  as  needed Meloxicacm 15mg  daily  She also takes: Alprazolam for anxiety Citalopram 40mg  daily for depression.  PAST MEDICAL HISTORY: Past Medical History:  Diagnosis Date  . Anxiety   . Arthritis   . Degenerative spondylolisthesis    L4-5, grade 1  . Depression   . HLD (hyperlipidemia)   . Hypercholesteremia   . Left lumbar radiculopathy   . Lumbago   . Lumbar degenerative disc disease   . Lumbar spondylosis   . Lumbar stenosis with neurogenic claudication    L4-5  . Osteoporosis   . Post-nasal drip   . Slow transit constipation   . Spinal stenosis   . Vitamin D deficiency   . Weakness of both lower extremities     PAST SURGICAL HISTORY: Past Surgical History:  Procedure Laterality Date  . EYE SURGERY    . TONSILLECTOMY      MEDICATIONS: Current Outpatient Prescriptions on File Prior to Visit  Medication Sig Dispense Refill  . ALPRAZolam (XANAX) 0.25 MG tablet Take 0.25-0.5 mg by mouth 2 (two) times daily as needed for anxiety or sleep.    . bisacodyl (DULCOLAX) 10 MG suppository Place 10 mg rectally daily as needed for moderate constipation.    . Cholecalciferol (VITAMIN D-3 PO) Take 1,000 Units by mouth daily.    . citalopram (CELEXA) 40 MG tablet Take 40 mg by mouth daily.    . ferrous sulfate 325 (65 FE) MG tablet Take 325 mg by mouth 2 (two) times daily with a meal.    . loratadine (CLARITIN) 10 MG tablet Take 10 mg by mouth daily.    . Misc Natural Products (GLUCOSAMINE CHONDROITIN MSM) TABS Take 1 tablet by mouth daily.    Marland Kitchen  polyethylene glycol (MIRALAX / GLYCOLAX) packet Take 17 g by mouth daily.     . sennosides-docusate sodium (SENOKOT-S) 8.6-50 MG tablet Take 2 tablets by mouth 2 (two) times daily.    . simvastatin (ZOCOR) 20 MG tablet Take 40 mg by mouth daily.     Marland Kitchen tiZANidine (ZANAFLEX) 4 MG capsule Take 4 mg by mouth 2 (two) times daily as needed.     . traMADol (ULTRAM) 50 MG tablet Take 100 mg by mouth 2 (two) times daily as needed for moderate pain.     Marland Kitchen alendronate (FOSAMAX) 70 MG tablet Take 70 mg by mouth once a week. Take with a full glass of water on an empty stomach.    Marland Kitchen HYDROcodone-acetaminophen (NORCO/VICODIN) 5-325 MG tablet Take 1-2 tablets by mouth every 4 (four) hours as needed (mild pain). (Patient not taking: Reported on 02/23/2016) 1 tablet 0  . meloxicam (MOBIC) 15 MG tablet Take 15 mg by mouth daily.    Marland Kitchen omega-3 acid ethyl esters (LOVAZA) 1 g capsule Take by mouth 2 (two) times daily.    . Protein (PROCEL) POWD Take 2 scoop by mouth 2 (two) times daily.    . vitamin B-12 (CYANOCOBALAMIN) 500 MCG tablet Take 500 mcg by mouth daily.     No current facility-administered medications on file prior to visit.     ALLERGIES: Allergies  Allergen Reactions  . Penicillins     Has patient had a PCN reaction causing immediate rash, facial/tongue/throat swelling, SOB or lightheadedness with hypotension: No No Has patient had a PCN reaction causing severe rash involving mucus membranes or skin necrosis: No NO Has patient had a PCN reaction that required hospitalization No No Has patient had a PCN reaction occurring within the last 10 years: NoNo If all of the above answers are "NO", then may proceed with Cephalosporin   . Tetracyclines & Related Other (See Comments)    Throat ulcer    FAMILY HISTORY: Mother:  Thrombocytopenia Father:  Cancer, CHF  SOCIAL HISTORY: Social History   Social History  . Marital status: Single    Spouse name: N/A  . Number of children: N/A  . Years of education: N/A   Occupational History  . Not on file.   Social History Main Topics  . Smoking status: Never Smoker  . Smokeless tobacco: Never Used  . Alcohol use No  . Drug use: No  . Sexual activity: Not on file   Other Topics Concern  . Not on file   Social History Narrative  . No narrative on file    REVIEW OF SYSTEMS: Constitutional: No fevers, chills, or sweats, no generalized fatigue, change in appetite Eyes: No visual  changes, double vision, eye pain Ear, nose and throat: No hearing loss, ear pain, nasal congestion, sore throat Cardiovascular: No chest pain, palpitations Respiratory:  No shortness of breath at rest or with exertion, wheezes GastrointestinaI: No nausea, vomiting, diarrhea, abdominal pain, fecal incontinence Genitourinary:  No dysuria, urinary retention or frequency Musculoskeletal:  neck pain, back pain Integumentary: No rash, pruritus, skin lesions Neurological: as above Psychiatric: anxiety, depression Endocrine: No palpitations, fatigue, diaphoresis, mood swings, change in appetite, change in weight, increased thirst Hematologic/Lymphatic:  No purpura, petechiae. Allergic/Immunologic: no itchy/runny eyes, nasal congestion, recent allergic reactions, rashes  PHYSICAL EXAM: Vitals:   08/10/16 1006  BP: 122/68  Pulse: 81   General: No acute distress.  Patient appears well-groomed.  Head:  Normocephalic/atraumatic Eyes:  fundi examined but not visualized Neck: supple, no  paraspinal tenderness, full range of motion Back: left paraspinal tenderness Heart: regular rate and rhythm Lungs: Clear to auscultation bilaterally. Vascular: No carotid bruits. Neurological Exam: Mental status: alert and oriented to person, place, and time, recent and remote memory intact, fund of knowledge intact, attention and concentration intact, speech fluent and not dysarthric, language intact. Cranial nerves: CN I: not tested CN II: pupils equal, round and reactive to light, visual fields intact CN III, IV, VI:  full range of motion, no nystagmus, no ptosis CN V: facial sensation intact CN VII: upper and lower face symmetric CN VIII: hearing intact CN IX, X: gag intact, uvula midline CN XI: sternocleidomastoid and trapezius muscles intact CN XII: tongue midline Bulk & Tone: normal, no fasciculations. Motor:  5/5 throughout  Sensation:  Pinprick and vibration sensation intact. Deep Tendon Reflexes:   2+ throughout, toes downgoing.  Finger to nose testing:  Without dysmetria.  Heel to shin:  Without dysmetria.  Gait:  Mildly antalgic.   IMPRESSION: Chronic lumbar degenerative disc disease with lumbosacral radiculitis  PLAN: 1.  Titrate gabapentin to 100mg  three times daily 2.  Continue home exercises.  Do not overexert yourself, no heavy lifting, no hiking 3.  Continue tizanidine and antiinflammatories.  4.  Follow up in 3 months.  Thank you for allowing me to take part in the care of this patient.  Shon MilletAdam Jaffe, DO  CC: Lupita RaiderKimberlee Shaw, MD

## 2016-09-12 ENCOUNTER — Telehealth: Payer: Self-pay | Admitting: Neurology

## 2016-09-12 MED ORDER — GABAPENTIN 100 MG PO CAPS: ORAL_CAPSULE | ORAL | 0 refills | Status: DC

## 2016-09-12 NOTE — Telephone Encounter (Signed)
PT called in regards to her medication Gabapentin/Dawn CB# [phone]

## 2016-09-12 NOTE — Telephone Encounter (Signed)
I would continue titrating the gabapentin.  I believe she is currently taking 100mg  three times daily:  Take 100mg  in AM, 100mg  afternoon, and 200mg  at bedtime for 7 days  Then 200mg  in AM, 100mg  afternoon, and 200mg  at bedtime for 7 days  Then 200mg  three times daily.

## 2016-09-12 NOTE — Telephone Encounter (Signed)
Message relayed to patient. Verbalized understanding and denied questions. RX sent in.  

## 2016-09-12 NOTE — Telephone Encounter (Signed)
Spoke with patient. She wanted to let you know that she had been gradually getting better, but now feels as if she has lost all progress. She is still having frequent, intense pain, she is not able to do much. Pt did stated she over did it yesterday, and is paying for it today, but stated she was declining before this. Patient would like to know if you want to change medication before she refills it. Please advise.

## 2016-09-20 ENCOUNTER — Telehealth: Payer: Self-pay | Admitting: Neurology

## 2016-09-20 NOTE — Telephone Encounter (Signed)
If the symptoms have continued one week after stopping the gabapentin, then it is unlikely due to the gabapentin.  I wouldn't restart the gabapentin until she is evaluated by her PCP

## 2016-09-20 NOTE — Telephone Encounter (Signed)
Pt reqsts to know should Gabapentin cause loose bowels She reports she has had loose bowels since starting Gabapentin in January She stopped Gabapentin 1 week ago on her own Now for the past week she has had upset stomach, flatulence, N&V. She wants to know should she start back taking Gabapentin Advised patient to contact PCP; would inform Dr. Shela CommonsJ. Patient c/b to inform she has an appt w/ PCP on Thursday 03/15

## 2016-09-20 NOTE — Telephone Encounter (Signed)
PT called and needs a call back regarding medication/Dawn CB# [phone]

## 2016-09-21 ENCOUNTER — Telehealth: Payer: Self-pay | Admitting: Neurology

## 2016-09-21 NOTE — Telephone Encounter (Signed)
Called patient.  No answer.

## 2016-09-21 NOTE — Telephone Encounter (Signed)
PT returned your call/Dawn CB# [phone]

## 2016-09-21 NOTE — Telephone Encounter (Signed)
Spoke to patient gave instructions per Dr. Moises BloodJaffe's previous note. Patient verbalized understanding.

## 2016-09-22 ENCOUNTER — Telehealth: Payer: Self-pay | Admitting: Neurology

## 2016-09-22 NOTE — Telephone Encounter (Signed)
Pt states PCP told her she could start back on Gabapentin. Pt requesting to know if she should titrate up or not since she is starting med again. Advised patient to titrate up same as when she originally started taking med. Pt agreed and verbalized understanding.

## 2016-09-22 NOTE — Telephone Encounter (Signed)
PT called in regards to her medication gabapentin/Dawn CB# [phone]

## 2016-09-23 ENCOUNTER — Other Ambulatory Visit: Payer: Self-pay | Admitting: Family Medicine

## 2016-09-23 DIAGNOSIS — R140 Abdominal distension (gaseous): Secondary | ICD-10-CM

## 2016-09-26 ENCOUNTER — Ambulatory Visit
Admission: RE | Admit: 2016-09-26 | Discharge: 2016-09-26 | Disposition: A | Discharge status: Home / Self Care | Payer: Medicare Other | Source: Ambulatory Visit | Attending: Family Medicine | Admitting: Family Medicine

## 2016-09-26 DIAGNOSIS — R140 Abdominal distension (gaseous): Secondary | ICD-10-CM

## 2016-09-26 MED ORDER — IOPAMIDOL (ISOVUE-300) INJECTION 61%
100.0000 mL | Freq: Once | INTRAVENOUS | Status: AC | PRN
  Administered 2016-09-26: 100 mL via INTRAVENOUS

## 2016-10-12 ENCOUNTER — Other Ambulatory Visit: Payer: Self-pay | Admitting: Adult Health

## 2016-11-08 ENCOUNTER — Encounter: Payer: Self-pay | Admitting: Neurology

## 2016-11-08 ENCOUNTER — Ambulatory Visit (INDEPENDENT_AMBULATORY_CARE_PROVIDER_SITE_OTHER): Payer: Medicare Other | Admitting: Neurology

## 2016-11-08 VITALS — BP 120/72 | HR 73 | Temp 97.5°F | Ht 62.0 in | Wt 142.0 lb

## 2016-11-08 DIAGNOSIS — G8929 Other chronic pain: Secondary | ICD-10-CM | POA: Diagnosis not present

## 2016-11-08 DIAGNOSIS — M5442 Lumbago with sciatica, left side: Secondary | ICD-10-CM | POA: Diagnosis not present

## 2016-11-08 MED ORDER — GABAPENTIN 300 MG PO CAPS: ORAL_CAPSULE | ORAL | 0 refills | Status: DC

## 2016-11-08 NOTE — Patient Instructions (Signed)
1.  We are going to increase gabapentin.  Start gabapentin  capsules.   Take 1 capsule three times daily for 7 days,   Then 1 capsule in morning, 1 capsule at noon and 2 capsules at bedtime for 7 days,  Then 2 capsules in morning, 1 capsule at noon and 2 capsules at bedtime for 7 days,  Then 2 capsules three times daily.      Contact me two weeks after having been on 2 capsules three times daily and we can still increase dose if needed. 2.  Stop duloxetine. 3.  Follow up in 3 months.

## 2016-11-08 NOTE — Progress Notes (Signed)
NEUROLOGY FOLLOW UP OFFICE NOTE  Brittany [name] [id number]  HISTORY OF PRESENT ILLNESS: Brittany [name] is a 71 year old female with osteoarthritis, progressive spinal stenosis with chronic back pain, neck pain, depression, anxiety and hyperlipidemia who follows up for chronic lumbar degenerative disc disease with left sided lumbosacral radiculitis.  UPDATE: Gabapentin  three times daily Tizanidine and anti-inflammatories.  She has not noticed any difference since gabapentin.  Over the past month, she has had recurrent flare-ups.  If she is active for at least 3 hours without rest, she will develop a flare up.  Laying supine is helpful.  Back spasms are improved.  Last month, she was started on Cymbalta  daily by her PCP.  She still has not noticed any improvement.  Understandably, this is very discouraging for her.   HISTORY: She has longstanding history of progressive degenerative low back pain with lumbar stenosis with neurogenic claudication.  It would flare-up off and on over the years.  Last year, she developed another flareup with pain down the left leg.  MRI of lumbar spine from 05/12/15 was personally reviewed and revealed multilevel disc and facet degeneration with severe spinal stenosis at L4-5, progressed compared to prior study from 07/12/08.  She underwent L4-5 lumbar decompression and fusion on 10/05/15.  Post-surgical lumbar X-ray revealed L4-L5 interbody fusion with stable mild anterolisthesis.  She was doing well until late July when she overexerted herself on a camping/hiking trip.  She developed severe stinging pain with numbness and tingling down the left leg to the sole of her left foot.  Bending exacerbates it.  Laying down feels better.  She denies any weakness or bowel/bladder dysfunctionn.  She underwent physical therapy, spinal nerve root injections and multiple antiinflammatories (diclofenac, meloxicam, naproxen), as well as tramadol and muscle relaxants, which  have only been minimally effective.  She had a repeat MRI of the lumbar spine performed by her neurosurgeon in October, which is not available to me but reportedly was okay.  Since October 2017, she has had some improvement in the pain.  She never tried Lyrica.   Current medications for back pain include: Tizanidine  as needed at bedtime Tramadol  to  as needed Meloxicacm  daily  PAST MEDICAL HISTORY: Past Medical History:  Diagnosis Date  . Anxiety   . Arthritis   . Degenerative spondylolisthesis    L4-5, grade 1  . Depression   . HLD (hyperlipidemia)   . Hypercholesteremia   . Left lumbar radiculopathy   . Lumbago   . Lumbar degenerative disc disease   . Lumbar spondylosis   . Lumbar stenosis with neurogenic claudication    L4-5  . Osteoporosis   . Post-nasal drip   . Slow transit constipation   . Spinal stenosis   . Vitamin D deficiency   . Weakness of both lower extremities     MEDICATIONS: Current Outpatient Prescriptions on File Prior to Visit  Medication Sig Dispense Refill  . acetaminophen (TYLENOL) 500 MG tablet Take 500 mg by mouth every 6 (six) hours as needed.    Marland Kitchen alendronate (FOSAMAX) 70 MG tablet Take 70 mg by mouth once a week. Take with a full glass of water on an empty stomach.    . ALPRAZolam (XANAX) 0.25 MG tablet Take 0.25-0.5 mg by mouth 2 (two) times daily as needed for anxiety or sleep.    . bisacodyl (DULCOLAX) 10 MG suppository Place 10 mg rectally daily as needed for moderate constipation.    Marland Kitchen  calcium elemental as carbonate (TUMS ULTRA 1000) 400 MG chewable tablet Chew 1,000 mg by mouth 3 (three) times daily.    . Cholecalciferol (VITAMIN D-3 PO) Take 1,000 Units by mouth daily.    . citalopram (CELEXA) 40 MG tablet Take 40 mg by mouth daily.    Marland Kitchen loratadine (CLARITIN) 10 MG tablet Take 10 mg by mouth daily.    . Misc Natural Products (GLUCOSAMINE CHONDROITIN MSM) TABS Take 1 tablet by mouth daily.    . naproxen sodium (ANAPROX)  220 MG tablet Take 220 mg by mouth 2 (two) times daily with a meal.    . omega-3 acid ethyl esters (LOVAZA) 1 g capsule Take by mouth 2 (two) times daily.    . Omega-3 Fatty Acids (FISH OIL) 1200 MG CAPS Take 1 capsule by mouth daily.    . polyethylene glycol (MIRALAX / GLYCOLAX) packet Take 17 g by mouth daily.     . Protein (PROCEL) POWD Take 2 scoop by mouth 2 (two) times daily.    . sennosides-docusate sodium (SENOKOT-S) 8.6-50 MG tablet Take 2 tablets by mouth 2 (two) times daily.    . simvastatin (ZOCOR) 20 MG tablet Take 40 mg by mouth daily.     Marland Kitchen tiZANidine (ZANAFLEX) 4 MG capsule Take 4 mg by mouth 2 (two) times daily as needed.     . traMADol (ULTRAM) 50 MG tablet Take 100 mg by mouth 2 (two) times daily as needed for moderate pain.     No current facility-administered medications on file prior to visit.     ALLERGIES: Allergies  Allergen Reactions  . Penicillins     Has patient had a PCN reaction causing immediate rash, facial/tongue/throat swelling, SOB or lightheadedness with hypotension: No No Has patient had a PCN reaction causing severe rash involving mucus membranes or skin necrosis: No NO Has patient had a PCN reaction that required hospitalization No No Has patient had a PCN reaction occurring within the last 10 years: NoNo If all of the above answers are "NO", then may proceed with Cephalosporin   . Tetracyclines & Related Other (See Comments)    Throat ulcer    FAMILY HISTORY: No family history on file.  SOCIAL HISTORY: Social History   Social History  . Marital status: Single    Spouse name: N/A  . Number of children: N/A  . Years of education: N/A   Occupational History  . Not on file.   Social History Main Topics  . Smoking status: Never Smoker  . Smokeless tobacco: Never Used  . Alcohol use No  . Drug use: No  . Sexual activity: Not on file   Other Topics Concern  . Not on file   Social History Narrative  . No narrative on file     REVIEW OF SYSTEMS: Constitutional: No fevers, chills, or sweats, no generalized fatigue, change in appetite Eyes: No visual changes, double vision, eye pain Ear, nose and throat: No hearing loss, ear pain, nasal congestion, sore throat Cardiovascular: No chest pain, palpitations Respiratory:  No shortness of breath at rest or with exertion, wheezes GastrointestinaI: No nausea, vomiting, diarrhea, abdominal pain, fecal incontinence Genitourinary:  No dysuria, urinary retention or frequency Musculoskeletal:  Neck pain, back pain Integumentary: No rash, pruritus, skin lesions Neurological: as above Psychiatric: No depression, insomnia, anxiety Endocrine: No palpitations, fatigue, diaphoresis, mood swings, change in appetite, change in weight, increased thirst Hematologic/Lymphatic:  No purpura, petechiae. Allergic/Immunologic: no itchy/runny eyes, nasal congestion, recent allergic reactions, rashes  PHYSICAL EXAM: Vitals:   11/08/16 1132  BP: 120/72  Pulse: 73  Temp: 97.5 F (36.4 C)   General: No acute distress.  Patient appears well-groomed.  normal body habitus. Head:  Normocephalic/atraumatic Eyes:  Fundi examined but not visualized Neck: supple, no paraspinal tenderness, full range of motion Heart:  Regular rate and rhythm Lungs:  Clear to auscultation bilaterally Back: left lower paraspinal tenderness Neurological Exam: alert and oriented to person, place, and time. Attention span and concentration intact, recent and remote memory intact, fund of knowledge intact.  Speech fluent and not dysarthric, language intact.  CN II-XII intact. Bulk and tone normal, muscle strength 5/5 throughout.  Sensation to light touch  intact.  Deep tendon reflexes 2+ throughout.  Finger to nose testing intact.  Gait normal, Romberg negative.  IMPRESSION: Left sided lumbosacral radiculitis  PLAN: She is now on low doses of two medications effective for neuropathic pain (gabapentin and  duloxetine).  We haven't optimized the gabapentin yet, so I will have her discontinue Cymbalta for now and we will titrate the gabapentin.  If gabapentin is proven to be ineffective, we can always retry Cymbalta and titrate accordingly.  1.  Will aggressively titrate gabapentin to goal of  three times daily 2.  Discontinue duloxetine 3.  Continue home exercises 4.  She has follow up with her neurosurgeon, Dr. Newell Coral 5.  Follow up with me in 3 months.  15 minutes spent face to face with patient, over 50% spent discussing management.  Shon Millet, DO  CC:  Lupita Raider, MD

## 2016-11-28 ENCOUNTER — Other Ambulatory Visit: Payer: Self-pay | Admitting: Neurology

## 2016-11-29 DIAGNOSIS — M545 Low back pain, unspecified: Secondary | ICD-10-CM | POA: Insufficient documentation

## 2016-12-14 ENCOUNTER — Telehealth: Payer: Self-pay | Admitting: Neurology

## 2016-12-14 NOTE — Telephone Encounter (Signed)
Dr Everlena CooperJaffe Please advise  Thanks

## 2016-12-14 NOTE — Telephone Encounter (Signed)
Caller: Brittany MccreedyBarbara  Urgent? No  Reason for the call: She has been taking Gabapentin and she feels that it has not helped much. She would like to change to another medication. Please call. Thanks

## 2016-12-15 MED ORDER — PREGABALIN 50 MG PO CAPS: 50.0000 mg | ORAL_CAPSULE | Freq: Three times a day (TID) | ORAL | 3 refills | Status: DC

## 2016-12-15 NOTE — Telephone Encounter (Signed)
Gabapentin isn't optimized.  I would like her to increase dose to 900mg  three times daily, especially if she has had any improvement at all.  If no improvement in 2 weeks, we can try a different medication.  If she is insistent on trying a different medication, we can start Lyrica 50mg  three times daily (and she may discontinue gabapentin if it has not been effective at all).  Also, I still have not received the lumbar MRI report that was performed by her spine surgeon back in October.  Could we please contact the office and have this faxed over?

## 2016-12-15 NOTE — Telephone Encounter (Signed)
Patient declined any increase in Gabapentin stating it absolutely is not working and she is now starting to feel dizzy.  Per Dr. Jaffe I toldEverlena [name] her she could take Lyrica 50mg  twice a day.  She asked about tapering the Gabapentin so I advised her to drop down two tab once a day and then 1 tab once a day and then stop.  She may go ahead and start the Lyrica while still on the Gabapentin.  Patient understood.

## 2017-01-12 ENCOUNTER — Telehealth: Payer: Self-pay | Admitting: Neurology

## 2017-01-12 NOTE — Telephone Encounter (Signed)
Left message on machine for patient to call back.

## 2017-01-12 NOTE — Telephone Encounter (Signed)
Patient is doing ok but still has some numbness and tingling. Limitations are still there no improvement in that. She can only be active for only 3 hours. She will also need to talk to someone about her lyrica please call

## 2017-02-23 ENCOUNTER — Telehealth: Payer: Self-pay | Admitting: Neurology

## 2017-02-23 NOTE — Telephone Encounter (Signed)
Do I need to do anything further?

## 2017-02-23 NOTE — Telephone Encounter (Signed)
Patient was called to see if they wanted to come in sooner due to a opening and her being on the wait list she canceled her nov appt and did not resch. She also wanted to let you know she went off of the lyrica

## 2017-02-23 NOTE — Telephone Encounter (Signed)
Noted.  She can return to clinic as needed.

## 2017-02-24 ENCOUNTER — Ambulatory Visit: Payer: Medicare Other | Admitting: Neurology

## 2017-04-03 ENCOUNTER — Other Ambulatory Visit: Payer: Self-pay | Admitting: Family Medicine

## 2017-04-03 DIAGNOSIS — R911 Solitary pulmonary nodule: Secondary | ICD-10-CM

## 2017-04-06 ENCOUNTER — Ambulatory Visit
Admission: RE | Admit: 2017-04-06 | Discharge: 2017-04-06 | Disposition: A | Discharge status: Home / Self Care | Payer: Medicare Other | Source: Ambulatory Visit | Attending: Family Medicine | Admitting: Family Medicine

## 2017-04-06 DIAGNOSIS — R911 Solitary pulmonary nodule: Secondary | ICD-10-CM

## 2017-04-07 ENCOUNTER — Other Ambulatory Visit: Payer: Medicare Other

## 2017-05-22 ENCOUNTER — Ambulatory Visit: Payer: Medicare Other | Admitting: Neurology

## 2017-05-25 ENCOUNTER — Other Ambulatory Visit: Payer: Self-pay | Admitting: Family Medicine

## 2017-05-25 DIAGNOSIS — R109 Unspecified abdominal pain: Secondary | ICD-10-CM

## 2017-05-31 ENCOUNTER — Ambulatory Visit
Admission: RE | Admit: 2017-05-31 | Discharge: 2017-05-31 | Disposition: A | Discharge status: Home / Self Care | Payer: Medicare Other | Source: Ambulatory Visit | Attending: Family Medicine | Admitting: Family Medicine

## 2017-05-31 DIAGNOSIS — R109 Unspecified abdominal pain: Secondary | ICD-10-CM

## 2017-07-31 ENCOUNTER — Other Ambulatory Visit: Payer: Self-pay | Admitting: Surgery

## 2017-08-08 NOTE — Pre-Procedure Instructions (Signed)
Renee PainBarbara S Thaxton  08/08/2017      [facility] [facility] [place],  - [address] [place] [address] Phone: [phone] Fax: [phone]  [facility] [address] [place] - [id number] [place] [address] Phone: [phone] Fax: [phone]    Your procedure is scheduled on Tues. Feb. 5  [facility] Admitting at 5:30  A.M.  Call this number if you have problems the morning of surgery:  [phone]   Remember:  Do not eat food or drink liquids after midnight on Mon. Feb.4    Take these medicines the morning of surgery with A SIP OF WATER : tylenol if needed, alprazolam (xanax), citalopram (celexa), tizanidine (zanaflex),tramadol if needed              7 days prior to surgery STOP taking any Aspirin(unless otherwise instructed by your surgeon), Aleve, Naproxen, Ibuprofen, Motrin, Advil, Goody's, BC's, all herbal medications, fish oil, and all vitamins   Do not wear jewelry, make-up or nail polish.  Do not wear lotions, powders, or perfumes, or deodorant.  Do not shave 48 hours prior to surgery.  Men may shave face and neck.  Do not bring valuables to the hospital.  [facility] is not responsible for any belongings or valuables.  Contacts, dentures or bridgework may not be worn into surgery.  Leave your suitcase in the car.  After surgery it may be brought to your room.  For patients admitted to the hospital, discharge time will be determined by your treatment team.  Patients discharged the day of surgery will not be allowed to drive home.    Special instructions:   [facility]- Preparing For Surgery  Before surgery, you can play an important role. Because skin is not sterile, your skin needs to be as free of germs as possible. You can reduce the number of germs on your skin by washing with CHG (chlorahexidine gluconate) Soap before surgery.  CHG  is an antiseptic cleaner which kills germs and bonds with the skin to continue killing germs even after washing.  Please do not use if you have an allergy to CHG or antibacterial soaps. If your skin becomes reddened/irritated stop using the CHG.  Do not shave (including legs and underarms) for at least 48 hours prior to first CHG shower. It is OK to shave your face.  Please follow these instructions carefully.   1. Shower the NIGHT BEFORE SURGERY and the MORNING OF SURGERY with CHG.   2. If you chose to wash your hair, wash your hair first as usual with your normal shampoo.  3. After you shampoo, rinse your hair and body thoroughly to remove the shampoo.  4. Use CHG as you would any other liquid soap. You can apply CHG directly to the skin and wash gently with a scrungie or a clean washcloth.   5. Apply the CHG Soap to your body ONLY FROM THE NECK DOWN.  Do not use on open wounds or open sores. Avoid contact with your eyes, ears, mouth and genitals (private parts). Wash Face and genitals (private parts)  with your normal soap.  6. Wash thoroughly, paying special attention to the area where your surgery will be performed.  7. Thoroughly rinse your body with warm water from the neck down.  8. DO NOT shower/wash with your normal soap after using and rinsing off the CHG Soap.  9. Pat yourself dry  with a CLEAN TOWEL.  10. Wear CLEAN PAJAMAS to bed the night before surgery, wear comfortable clothes the morning of surgery  11. Place CLEAN SHEETS on your bed the night of your first shower and DO NOT SLEEP WITH PETS.    Day of Surgery: Do not apply any deodorants/lotions. Please wear clean clothes to the hospital/surgery center.      Please read over the following fact sheets that you were given. Coughing and Deep Breathing and Surgical Site Infection Prevention

## 2017-08-09 ENCOUNTER — Encounter (HOSPITAL_COMMUNITY): Payer: Self-pay

## 2017-08-09 ENCOUNTER — Encounter (HOSPITAL_COMMUNITY)
Admission: RE | Admit: 2017-08-09 | Discharge: 2017-08-09 | Disposition: A | Discharge status: Home / Self Care | Payer: Medicare Other | Source: Ambulatory Visit | Attending: Orthopaedic Surgery | Admitting: Orthopaedic Surgery

## 2017-08-09 ENCOUNTER — Other Ambulatory Visit: Payer: Self-pay

## 2017-08-09 DIAGNOSIS — Z01812 Encounter for preprocedural laboratory examination: Secondary | ICD-10-CM | POA: Diagnosis not present

## 2017-08-09 HISTORY — DX: Headache, unspecified: R51.9

## 2017-08-09 HISTORY — DX: Headache: R51

## 2017-08-09 HISTORY — DX: Anemia, unspecified: D64.9

## 2017-08-09 HISTORY — DX: Unspecified asthma, uncomplicated: J45.909

## 2017-08-09 LAB — CBC
HCT: 41.9 % (ref 36.0–46.0)
Hemoglobin: 13.3 g/dL (ref 12.0–15.0)
MCH: 31 pg (ref 26.0–34.0)
MCHC: 31.7 g/dL (ref 30.0–36.0)
MCV: 97.7 fL (ref 78.0–100.0)
Platelets: 186 10*3/uL (ref 150–400)
RBC: 4.29 MIL/uL (ref 3.87–5.11)
RDW: 13.2 % (ref 11.5–15.5)
WBC: 5.1 10*3/uL (ref 4.0–10.5)

## 2017-08-14 NOTE — H&P (Signed)
Brittany [name] Documented: 07/31/2017 2:34 PM Location: [facility] Patient #: [id number] DOB: [birth year] Single / Language: [place] / Race: White Female   History of Present Illness (Khyla Mccumbers A. Magnus IvanBlackman MD; 07/31/2017 2:50 PM) The patient is a 72 year old female who presents for evaluation of gall stones. This is a pleasant patient referred by Dr. Lupita RaiderKimberlee Shaw for evaluation of symptomatic cholelithiasis. She has been having attacks of epigastric abdominal pain with bloating since March of last year. She also has loose bowel movement. She attributes to this sometimes to fatty meals. Other times it is related stress. She reports having significant bloating after her evening meals. She's had some nausea but no emesis. Her last colonoscopy was 6 years ago and she is not due for a colonoscopy for 4 more years. She had an ultrasound showing gallstones. She had a CT scan which showed gallstones and was otherwise unremarkable. Her discomfort is mild to moderate. She denies jaundice.   Past Surgical History Doristine Devoid(Chemira Jones, CMA; 07/31/2017 2:34 PM) Cataract Surgery  Bilateral. Spinal Surgery - Lower Back  Tonsillectomy   Diagnostic Studies History Doristine Devoid(Chemira Jones, CMA; 07/31/2017 2:34 PM) Colonoscopy  5-10 years ago Mammogram  within last year Pap Smear  1-5 years ago  Allergies Doristine Devoid(Chemira Jones, CMA; 07/31/2017 2:36 PM) Penicillins  Tetracyclines & Related   Medication History Doristine Devoid(Chemira Jones, CMA; 07/31/2017 2:39 PM) ALPRAZolam (0.5MG  Tablet, Oral) Active. Alendronate Sodium (70MG  Tablet, Oral) Active. Citalopram Hydrobromide (40MG  Tablet, Oral) Active. Omeprazole (40MG  Capsule DR, Oral) Active. Simvastatin (40MG  Tablet, Oral) Active. TiZANidine HCl (4MG  Tablet, Oral) Active. Fish Oil (1200MG  Capsule, Oral) Active. Glucosamine (500MG  Capsule, Oral) Active. Loratadine (10MG  Capsule, Oral) Active. Magnesium (200MG  Tablet, Oral) Active. MiraLax (Oral)  Active. Stool Softener (100MG  Capsule, Oral) Active. Vitamin B12 (1000MCG Tablet ER, Oral) Active. Vitamin D (1000UNIT Tablet, Oral) Active. TraMADol HCl (50MG  Tablet, Oral) Active. Medications Reconciled  Social History Doristine Devoid(Chemira Jones, CMA; 07/31/2017 2:34 PM) Alcohol use  Occasional alcohol use. Caffeine use  Carbonated beverages, Coffee, Tea. No drug use  Tobacco use  Never smoker.  Family History Doristine Devoid(Chemira Jones, CMA; 07/31/2017 2:34 PM) Alcohol Abuse  Father. Arthritis  Family Members In General. Breast Cancer  Family Members In General. Cancer  Family Members In General. Cerebrovascular Accident  Family Members In General, Sister. Colon Cancer  Father. Heart Disease  Family Members In General, Father. Ischemic Bowel Disease  Brother. Rectal Cancer  Sister. Seizure disorder  Family Members In General, Sister.  Pregnancy / Birth History Doristine Devoid(Chemira Jones, CMA; 07/31/2017 2:34 PM) Age at menarche  13 years. Age of menopause  <45  Other Problems Doristine Devoid(Chemira Jones, CMA; 07/31/2017 2:34 PM) Anxiety Disorder  Arthritis  Asthma  Back Pain  Cholelithiasis  Depression  Hemorrhoids  Hypercholesterolemia  Migraine Headache  Transfusion history     Review of Systems Doristine Devoid(Chemira Jones CMA; 07/31/2017 2:34 PM) General Not Present- Appetite Loss, Chills, Fatigue, Fever, Night Sweats, Weight Gain and Weight Loss. Skin Not Present- Change in Wart/Mole, Dryness, Hives, Jaundice, New Lesions, Non-Healing Wounds, Rash and Ulcer. HEENT Present- Wears glasses/contact lenses. Not Present- Earache, Hearing Loss, Hoarseness, Nose Bleed, Oral Ulcers, Ringing in the Ears, Seasonal Allergies, Sinus Pain, Sore Throat, Visual Disturbances and Yellow Eyes. Respiratory Not Present- Bloody sputum, Chronic Cough, Difficulty Breathing, Snoring and Wheezing. Breast Not Present- Breast Mass, Breast Pain, Nipple Discharge and Skin Changes. Cardiovascular Not Present- Chest Pain,  Difficulty Breathing Lying Down, Leg Cramps, Palpitations, Rapid Heart Rate, Shortness of Breath and Swelling of Extremities. Gastrointestinal  Present- Abdominal Pain, Bloating, Change in Bowel Habits, Constipation and Excessive gas. Not Present- Bloody Stool, Chronic diarrhea, Difficulty Swallowing, Gets full quickly at meals, Hemorrhoids, Indigestion, Nausea, Rectal Pain and Vomiting. Female Genitourinary Present- Frequency and Nocturia. Not Present- Painful Urination, Pelvic Pain and Urgency. Musculoskeletal Present- Back Pain. Not Present- Joint Pain, Joint Stiffness, Muscle Pain, Muscle Weakness and Swelling of Extremities. Neurological Present- Headaches, Numbness and Tingling. Not Present- Decreased Memory, Fainting, Seizures, Tremor, Trouble walking and Weakness. Psychiatric Present- Anxiety and Depression. Not Present- Bipolar, Change in Sleep Pattern, Fearful and Frequent crying. Endocrine Present- Hot flashes. Not Present- Cold Intolerance, Excessive Hunger, Hair Changes, Heat Intolerance and New Diabetes. Hematology Present- Blood Thinners. Not Present- Easy Bruising, Excessive bleeding, Gland problems, HIV and Persistent Infections.  Vitals (Chemira Jones CMA; 07/31/2017 2:36 PM) 07/31/2017 2:36 PM Weight: 137.4 lb Height: 63in Body Surface Area: 1.65 m Body Mass Index: 24.34 kg/m  Temp.: 98.72F(Oral)  Pulse: 83 (Regular)  BP: 130/84 (Sitting, Left Arm, Standard)       Physical Exam (Tamirah George A. Magnus Ivan MD; 07/31/2017 2:51 PM) General Mental Status-Alert. General Appearance-Consistent with stated age. Hydration-Well hydrated. Voice-Normal.  Head and Neck Head-normocephalic, atraumatic with no lesions or palpable masses.  Eye Eyeball - Bilateral-Extraocular movements intact. Sclera/Conjunctiva - Bilateral-No scleral icterus.  Chest and Lung Exam Chest and lung exam reveals -quiet, even and easy respiratory effort with no use of accessory  muscles and on auscultation, normal breath sounds, no adventitious sounds and normal vocal resonance. Inspection Chest Wall - Normal. Back - normal.  Cardiovascular Cardiovascular examination reveals -on palpation PMI is normal in location and amplitude, no palpable S3 or S4. Normal cardiac borders., normal heart sounds, regular rate and rhythm with no murmurs, carotid auscultation reveals no bruits and normal pedal pulses bilaterally.  Abdomen Inspection Inspection of the abdomen reveals - No Hernias. Skin - Scar - no surgical scars. Palpation/Percussion Palpation and Percussion of the abdomen reveal - Soft, Non Tender, No Rebound tenderness, No Rigidity (guarding) and No hepatosplenomegaly. Auscultation Auscultation of the abdomen reveals - Bowel sounds normal.  Neurologic - Did not examine.  Musculoskeletal - Did not examine.    Assessment & Plan (Sindia Kowalczyk A. Magnus Ivan MD; 07/31/2017 2:51 PM) SYMPTOMATIC CHOLELITHIASIS (K80.20) Impression: I had a long discussion with the patient regarding gallstones. I reviewed her ultrasound and CT. I discussed gallbladder surgery in detail. I do believe some of her symptoms are related to the gallstones as does she. I looked for regarding laparoscopic cholecystectomy. We discussed the risks which includes but is not limited to bleeding, infection, injury to surrounding structures, the need to convert to an open procedure, cardiopulmonary issues, postoperative recovery, etc. She understands and wants to proceed with cholecystectomy.

## 2017-08-15 ENCOUNTER — Ambulatory Visit (HOSPITAL_COMMUNITY): Payer: Medicare Other | Admitting: Anesthesiology

## 2017-08-15 ENCOUNTER — Encounter (HOSPITAL_COMMUNITY)
Admission: RE | Disposition: A | Discharge status: Home / Self Care | Payer: Self-pay | Source: Ambulatory Visit | Attending: Surgery

## 2017-08-15 ENCOUNTER — Encounter (HOSPITAL_COMMUNITY): Payer: Self-pay | Admitting: *Deleted

## 2017-08-15 ENCOUNTER — Observation Stay (HOSPITAL_COMMUNITY)
Admission: RE | Admit: 2017-08-15 | Discharge: 2017-08-16 | Disposition: A | Discharge status: Home / Self Care | Payer: Medicare Other | Source: Ambulatory Visit | Attending: Surgery | Admitting: Surgery

## 2017-08-15 DIAGNOSIS — F329 Major depressive disorder, single episode, unspecified: Secondary | ICD-10-CM | POA: Insufficient documentation

## 2017-08-15 DIAGNOSIS — Z811 Family history of alcohol abuse and dependence: Secondary | ICD-10-CM | POA: Insufficient documentation

## 2017-08-15 DIAGNOSIS — Z9841 Cataract extraction status, right eye: Secondary | ICD-10-CM | POA: Diagnosis not present

## 2017-08-15 DIAGNOSIS — Z79899 Other long term (current) drug therapy: Secondary | ICD-10-CM | POA: Insufficient documentation

## 2017-08-15 DIAGNOSIS — Z9889 Other specified postprocedural states: Secondary | ICD-10-CM | POA: Insufficient documentation

## 2017-08-15 DIAGNOSIS — K219 Gastro-esophageal reflux disease without esophagitis: Secondary | ICD-10-CM | POA: Diagnosis not present

## 2017-08-15 DIAGNOSIS — Z820 Family history of epilepsy and other diseases of the nervous system: Secondary | ICD-10-CM | POA: Diagnosis not present

## 2017-08-15 DIAGNOSIS — M1990 Unspecified osteoarthritis, unspecified site: Secondary | ICD-10-CM | POA: Diagnosis not present

## 2017-08-15 DIAGNOSIS — Z881 Allergy status to other antibiotic agents status: Secondary | ICD-10-CM | POA: Insufficient documentation

## 2017-08-15 DIAGNOSIS — J45909 Unspecified asthma, uncomplicated: Secondary | ICD-10-CM | POA: Insufficient documentation

## 2017-08-15 DIAGNOSIS — F419 Anxiety disorder, unspecified: Secondary | ICD-10-CM | POA: Diagnosis not present

## 2017-08-15 DIAGNOSIS — Z8249 Family history of ischemic heart disease and other diseases of the circulatory system: Secondary | ICD-10-CM | POA: Diagnosis not present

## 2017-08-15 DIAGNOSIS — Z9842 Cataract extraction status, left eye: Secondary | ICD-10-CM | POA: Insufficient documentation

## 2017-08-15 DIAGNOSIS — Z803 Family history of malignant neoplasm of breast: Secondary | ICD-10-CM | POA: Diagnosis not present

## 2017-08-15 DIAGNOSIS — Z880 Allergy status to penicillin: Secondary | ICD-10-CM | POA: Diagnosis not present

## 2017-08-15 DIAGNOSIS — K8010 Calculus of gallbladder with chronic cholecystitis without obstruction: Principal | ICD-10-CM | POA: Insufficient documentation

## 2017-08-15 DIAGNOSIS — Z823 Family history of stroke: Secondary | ICD-10-CM | POA: Diagnosis not present

## 2017-08-15 DIAGNOSIS — K8080 Other cholelithiasis without obstruction: Secondary | ICD-10-CM | POA: Diagnosis present

## 2017-08-15 DIAGNOSIS — Z800 Family history of malignant neoplasm of digestive organs: Secondary | ICD-10-CM | POA: Insufficient documentation

## 2017-08-15 DIAGNOSIS — E7800 Pure hypercholesterolemia, unspecified: Secondary | ICD-10-CM | POA: Insufficient documentation

## 2017-08-15 DIAGNOSIS — Z8261 Family history of arthritis: Secondary | ICD-10-CM | POA: Diagnosis not present

## 2017-08-15 DIAGNOSIS — Z9049 Acquired absence of other specified parts of digestive tract: Secondary | ICD-10-CM

## 2017-08-15 HISTORY — PX: CHOLECYSTECTOMY: SHX55

## 2017-08-15 SURGERY — LAPAROSCOPIC CHOLECYSTECTOMY
Anesthesia: General | Site: Abdomen

## 2017-08-15 MED ORDER — MIDAZOLAM HCL 2 MG/2ML IJ SOLN
INTRAMUSCULAR | Status: AC
  Filled 2017-08-15: qty 2

## 2017-08-15 MED ORDER — IOPAMIDOL (ISOVUE-300) INJECTION 61%
INTRAVENOUS | Status: AC
  Filled 2017-08-15: qty 50

## 2017-08-15 MED ORDER — PHENYLEPHRINE HCL 10 MG/ML IJ SOLN
INTRAMUSCULAR | Status: DC | PRN
  Administered 2017-08-15: 80 ug via INTRAVENOUS

## 2017-08-15 MED ORDER — OXYCODONE HCL 5 MG PO TABS
ORAL_TABLET | ORAL | Status: AC
  Filled 2017-08-15: qty 1

## 2017-08-15 MED ORDER — TIZANIDINE HCL 2 MG PO TABS
4.0000 mg | ORAL_TABLET | Freq: Two times a day (BID) | ORAL | Status: DC
  Administered 2017-08-15 – 2017-08-16 (×3): 4 mg via ORAL
  Filled 2017-08-15 (×3): qty 2

## 2017-08-15 MED ORDER — 0.9 % SODIUM CHLORIDE (POUR BTL) OPTIME
TOPICAL | Status: DC | PRN
  Administered 2017-08-15: 1000 mL

## 2017-08-15 MED ORDER — PANTOPRAZOLE SODIUM 40 MG PO TBEC
40.0000 mg | DELAYED_RELEASE_TABLET | Freq: Every day | ORAL | Status: DC
  Administered 2017-08-15 – 2017-08-16 (×2): 40 mg via ORAL
  Filled 2017-08-15 (×2): qty 1

## 2017-08-15 MED ORDER — LACTATED RINGERS IV SOLN
INTRAVENOUS | Status: DC | PRN
  Administered 2017-08-15: 07:00:00 via INTRAVENOUS

## 2017-08-15 MED ORDER — OXYCODONE HCL 5 MG/5ML PO SOLN: 5.0000 mg | Freq: Once | ORAL | Status: AC | PRN

## 2017-08-15 MED ORDER — ACETAMINOPHEN 325 MG PO TABS
650.0000 mg | ORAL_TABLET | Freq: Four times a day (QID) | ORAL | Status: DC | PRN
  Administered 2017-08-15: 650 mg via ORAL
  Filled 2017-08-15: qty 2

## 2017-08-15 MED ORDER — ALPRAZOLAM 0.5 MG PO TABS
0.5000 mg | ORAL_TABLET | Freq: Two times a day (BID) | ORAL | Status: DC
  Administered 2017-08-15 – 2017-08-16 (×3): 0.5 mg via ORAL
  Filled 2017-08-15 (×3): qty 1

## 2017-08-15 MED ORDER — FENTANYL CITRATE (PF) 100 MCG/2ML IJ SOLN
25.0000 ug | INTRAMUSCULAR | Status: DC | PRN
  Administered 2017-08-15 (×2): 50 ug via INTRAVENOUS

## 2017-08-15 MED ORDER — BUPIVACAINE-EPINEPHRINE 0.25% -1:200000 IJ SOLN
INTRAMUSCULAR | Status: DC | PRN
  Administered 2017-08-15: 20 mL

## 2017-08-15 MED ORDER — METHOCARBAMOL 500 MG PO TABS
500.0000 mg | ORAL_TABLET | Freq: Four times a day (QID) | ORAL | Status: DC | PRN
  Administered 2017-08-15: 500 mg via ORAL

## 2017-08-15 MED ORDER — CHLORHEXIDINE GLUCONATE CLOTH 2 % EX PADS: 6.0000 | MEDICATED_PAD | Freq: Once | CUTANEOUS | Status: DC

## 2017-08-15 MED ORDER — FENTANYL CITRATE (PF) 250 MCG/5ML IJ SOLN
INTRAMUSCULAR | Status: DC | PRN
  Administered 2017-08-15: 100 ug via INTRAVENOUS
  Administered 2017-08-15: 50 ug via INTRAVENOUS

## 2017-08-15 MED ORDER — SUGAMMADEX SODIUM 200 MG/2ML IV SOLN
INTRAVENOUS | Status: DC | PRN
  Administered 2017-08-15: 300 mg via INTRAVENOUS

## 2017-08-15 MED ORDER — OXYCODONE HCL 5 MG PO TABS
5.0000 mg | ORAL_TABLET | Freq: Once | ORAL | Status: AC | PRN
  Administered 2017-08-15: 5 mg via ORAL

## 2017-08-15 MED ORDER — ROCURONIUM BROMIDE 100 MG/10ML IV SOLN
INTRAVENOUS | Status: DC | PRN
  Administered 2017-08-15: 50 mg via INTRAVENOUS

## 2017-08-15 MED ORDER — ENOXAPARIN SODIUM 40 MG/0.4ML ~~LOC~~ SOLN
40.0000 mg | SUBCUTANEOUS | Status: DC
  Administered 2017-08-16: 40 mg via SUBCUTANEOUS
  Filled 2017-08-15: qty 0.4

## 2017-08-15 MED ORDER — POTASSIUM CHLORIDE IN NACL 20-0.9 MEQ/L-% IV SOLN
INTRAVENOUS | Status: DC
  Administered 2017-08-15: 11:00:00 via INTRAVENOUS
  Filled 2017-08-15 (×2): qty 1000

## 2017-08-15 MED ORDER — FENTANYL CITRATE (PF) 100 MCG/2ML IJ SOLN
INTRAMUSCULAR | Status: AC
  Filled 2017-08-15: qty 2

## 2017-08-15 MED ORDER — PROPOFOL 10 MG/ML IV BOLUS
INTRAVENOUS | Status: AC
  Filled 2017-08-15: qty 20

## 2017-08-15 MED ORDER — CITALOPRAM HYDROBROMIDE 20 MG PO TABS
40.0000 mg | ORAL_TABLET | Freq: Every evening | ORAL | Status: DC
  Filled 2017-08-15: qty 2

## 2017-08-15 MED ORDER — DIPHENHYDRAMINE HCL 12.5 MG/5ML PO ELIX: 12.5000 mg | ORAL_SOLUTION | Freq: Four times a day (QID) | ORAL | Status: DC | PRN

## 2017-08-15 MED ORDER — ONDANSETRON HCL 4 MG/2ML IJ SOLN: 4.0000 mg | Freq: Four times a day (QID) | INTRAMUSCULAR | Status: DC | PRN

## 2017-08-15 MED ORDER — TRAMADOL HCL 50 MG PO TABS
50.0000 mg | ORAL_TABLET | Freq: Four times a day (QID) | ORAL | Status: DC | PRN
  Administered 2017-08-16: 50 mg via ORAL
  Filled 2017-08-15: qty 1

## 2017-08-15 MED ORDER — PROPOFOL 10 MG/ML IV BOLUS
INTRAVENOUS | Status: DC | PRN
  Administered 2017-08-15: 140 mg via INTRAVENOUS

## 2017-08-15 MED ORDER — SODIUM CHLORIDE 0.9 % IR SOLN
Status: DC | PRN
  Administered 2017-08-15: 1

## 2017-08-15 MED ORDER — FENTANYL CITRATE (PF) 250 MCG/5ML IJ SOLN
INTRAMUSCULAR | Status: AC
  Filled 2017-08-15: qty 5

## 2017-08-15 MED ORDER — ONDANSETRON 4 MG PO TBDP: 4.0000 mg | ORAL_TABLET | Freq: Four times a day (QID) | ORAL | Status: DC | PRN

## 2017-08-15 MED ORDER — CIPROFLOXACIN IN D5W 400 MG/200ML IV SOLN
400.0000 mg | INTRAVENOUS | Status: AC
  Administered 2017-08-15: 400 mg via INTRAVENOUS
  Filled 2017-08-15: qty 200

## 2017-08-15 MED ORDER — MORPHINE SULFATE (PF) 4 MG/ML IV SOLN: 1.0000 mg | INTRAVENOUS | Status: DC | PRN

## 2017-08-15 MED ORDER — METHOCARBAMOL 500 MG PO TABS
ORAL_TABLET | ORAL | Status: AC
  Filled 2017-08-15: qty 1

## 2017-08-15 MED ORDER — BUPIVACAINE-EPINEPHRINE (PF) 0.5% -1:200000 IJ SOLN
INTRAMUSCULAR | Status: AC
  Filled 2017-08-15: qty 30

## 2017-08-15 MED ORDER — LIDOCAINE HCL (CARDIAC) 20 MG/ML IV SOLN
INTRAVENOUS | Status: DC | PRN
  Administered 2017-08-15: 60 mg via INTRATRACHEAL

## 2017-08-15 MED ORDER — OXYCODONE HCL 5 MG PO TABS
5.0000 mg | ORAL_TABLET | ORAL | Status: DC | PRN
  Administered 2017-08-15 (×2): 10 mg via ORAL
  Administered 2017-08-16: 5 mg via ORAL
  Administered 2017-08-16: 10 mg via ORAL
  Filled 2017-08-15: qty 2
  Filled 2017-08-15: qty 1
  Filled 2017-08-15 (×2): qty 2

## 2017-08-15 MED ORDER — MIDAZOLAM HCL 5 MG/5ML IJ SOLN
INTRAMUSCULAR | Status: DC | PRN
  Administered 2017-08-15: 2 mg via INTRAVENOUS

## 2017-08-15 MED ORDER — DIPHENHYDRAMINE HCL 50 MG/ML IJ SOLN: 12.5000 mg | Freq: Four times a day (QID) | INTRAMUSCULAR | Status: DC | PRN

## 2017-08-15 MED ORDER — DEXAMETHASONE SODIUM PHOSPHATE 10 MG/ML IJ SOLN
INTRAMUSCULAR | Status: DC | PRN
  Administered 2017-08-15: 5 mg via INTRAVENOUS

## 2017-08-15 SURGICAL SUPPLY — 37 items
ADH SKN CLS APL DERMABOND .7 (GAUZE/BANDAGES/DRESSINGS) ×1
APPLIER CLIP 5 13 M/L LIGAMAX5 (MISCELLANEOUS) ×3
APR CLP MED LRG 5 ANG JAW (MISCELLANEOUS) ×1
BAG SPEC RTRVL LRG 6X4 10 (ENDOMECHANICALS) ×1
CANISTER SUCT 3000ML PPV (MISCELLANEOUS) ×3 IMPLANT
CHLORAPREP W/TINT 26ML (MISCELLANEOUS) ×3 IMPLANT
CLIP APPLIE 5 13 M/L LIGAMAX5 (MISCELLANEOUS) ×1 IMPLANT
COVER SURGICAL LIGHT HANDLE (MISCELLANEOUS) ×3 IMPLANT
DERMABOND ADVANCED (GAUZE/BANDAGES/DRESSINGS) ×2
DERMABOND ADVANCED .7 DNX12 (GAUZE/BANDAGES/DRESSINGS) ×1 IMPLANT
ELECT REM PT RETURN 9FT ADLT (ELECTROSURGICAL) ×3
ELECTRODE REM PT RTRN 9FT ADLT (ELECTROSURGICAL) ×1 IMPLANT
GLOVE BIOGEL PI IND STRL 8.5 (GLOVE) IMPLANT
GLOVE BIOGEL PI INDICATOR 8.5 (GLOVE) ×2
GLOVE SURG SIGNA 7.5 PF LTX (GLOVE) ×3 IMPLANT
GLOVE SURG SS PI 8.0 STRL IVOR (GLOVE) ×2 IMPLANT
GOWN STRL REUS W/ TWL LRG LVL3 (GOWN DISPOSABLE) ×2 IMPLANT
GOWN STRL REUS W/ TWL XL LVL3 (GOWN DISPOSABLE) ×1 IMPLANT
GOWN STRL REUS W/TWL LRG LVL3 (GOWN DISPOSABLE) ×3
GOWN STRL REUS W/TWL XL LVL3 (GOWN DISPOSABLE) ×6
KIT BASIN OR (CUSTOM PROCEDURE TRAY) ×3 IMPLANT
KIT ROOM TURNOVER OR (KITS) ×3 IMPLANT
NS IRRIG 1000ML POUR BTL (IV SOLUTION) ×3 IMPLANT
PAD ARMBOARD 7.5X6 YLW CONV (MISCELLANEOUS) ×3 IMPLANT
POUCH SPECIMEN RETRIEVAL 10MM (ENDOMECHANICALS) ×3 IMPLANT
SCISSORS LAP 5X35 DISP (ENDOMECHANICALS) ×3 IMPLANT
SET IRRIG TUBING LAPAROSCOPIC (IRRIGATION / IRRIGATOR) ×3 IMPLANT
SLEEVE ENDOPATH XCEL 5M (ENDOMECHANICALS) ×6 IMPLANT
SPECIMEN JAR SMALL (MISCELLANEOUS) ×3 IMPLANT
SUT MNCRL AB 4-0 PS2 18 (SUTURE) ×3 IMPLANT
TOWEL OR 17X24 6PK STRL BLUE (TOWEL DISPOSABLE) ×3 IMPLANT
TOWEL OR 17X26 10 PK STRL BLUE (TOWEL DISPOSABLE) ×3 IMPLANT
TRAY LAPAROSCOPIC MC (CUSTOM PROCEDURE TRAY) ×3 IMPLANT
TROCAR XCEL BLUNT TIP 100MML (ENDOMECHANICALS) ×3 IMPLANT
TROCAR XCEL NON-BLD 5MMX100MML (ENDOMECHANICALS) ×3 IMPLANT
TUBING INSUFFLATION (TUBING) ×3 IMPLANT
WATER STERILE IRR 1000ML POUR (IV SOLUTION) ×3 IMPLANT

## 2017-08-15 NOTE — Transfer of Care (Signed)
Immediate Anesthesia Transfer of Care Note  Patient: Brittany PainBarbara S [name]  Procedure(s) Performed: LAPAROSCOPIC CHOLECYSTECTOMY (N/A Abdomen)  Patient Location: PACU  Anesthesia Type:General  Level of Consciousness: awake, alert  and oriented  Airway & Oxygen Therapy: Patient Spontanous Breathing and Patient connected to nasal cannula oxygen  Post-op Assessment: Report given to RN and Post -op Vital signs reviewed and stable  Post vital signs: Reviewed and stable  Last Vitals:  Vitals:   08/15/17 0557  BP: 128/72  Pulse: 79  Resp: 20  Temp: 36.7 C  SpO2: 95%    Last [name]:  Vitals:   08/15/17 0557  TempSrc: Oral         Complications: No apparent anesthesia complications

## 2017-08-15 NOTE — Op Note (Signed)
Laparoscopic Cholecystectomy Procedure Note  Indications: This patient presents with symptomatic gallbladder disease and will undergo laparoscopic cholecystectomy.  Pre-operative Diagnosis: symptomatic cholelithiasis  Post-operative Diagnosis: Same  Surgeon: Abigail Miyamoto A   Assistants: 0  Anesthesia: General endotracheal anesthesia  ASA Class: 3  Procedure Details  The patient was seen again in the Holding Room. The risks, benefits, complications, treatment options, and expected outcomes were discussed with the patient. The possibilities of reaction to medication, pulmonary aspiration, perforation of viscus, bleeding, recurrent infection, finding a normal gallbladder, the need for additional procedures, failure to diagnose a condition, the possible need to convert to an open procedure, and creating a complication requiring transfusion or operation were discussed with the patient. The likelihood of improving the patient's symptoms with return to their baseline status is good.  The patient and/or family concurred with the proposed plan, giving informed consent. The site of surgery properly noted. The patient was taken to Operating Room, identified as NAVEH RICKLES and the procedure verified as Laparoscopic Cholecystectomy with Intraoperative Cholangiogram. A Time Out was held and the above information confirmed.  Prior to the induction of general anesthesia, antibiotic prophylaxis was administered. General endotracheal anesthesia was then administered and tolerated well. After the induction, the abdomen was prepped with Chloraprep and draped in sterile fashion. The patient was positioned in the supine position.  Local anesthetic agent was injected into the skin near the umbilicus and an incision made. We dissected down to the abdominal fascia with blunt dissection.  The fascia was incised vertically and we entered the peritoneal cavity bluntly.  A pursestring suture of 0-Vicryl was placed  around the fascial opening.  The Hasson cannula was inserted and secured with the stay suture.  Pneumoperitoneum was then created with CO2 and tolerated well without any adverse changes in the patient's vital signs. A 5-mm port was placed in the subxiphoid position and two 5-mm ports were placed in the right upper quadrant under directi vision.  All skin incisions were infiltrated with a local anesthetic agent before making the incision and placing the trocars.   We positioned the patient in reverse Trendelenburg, tilted slightly to the patient's left.  The gallbladder was identified, the fundus grasped and retracted cephalad. Adhesions were lysed bluntly and with the electrocautery where indicated, taking care not to injure any adjacent organs or viscus. The infundibulum was grasped and retracted laterally, exposing the peritoneum overlying the triangle of Calot. This was then divided and exposed in a blunt fashion. The cystic duct was clearly identified and bluntly dissected circumferentially. A critical view of the cystic duct and cystic artery was obtained.  The cystic duct was then ligated with clips and divided. The cystic artery was, dissected free, ligated with clips and divided as well.   The gallbladder was dissected from the liver bed in retrograde fashion with the electrocautery. The gallbladder was removed and placed in an Endocatch sac. The liver bed was irrigated and inspected. Hemostasis was achieved with the electrocautery. Copious irrigation was utilized and was repeatedly aspirated until clear.  The gallbladder and Endocatch sac were then removed through the umbilical port site.  The pursestring suture was used to close the umbilical fascia.    We again inspected the right upper quadrant for hemostasis.  Pneumoperitoneum was released as we removed the trocars.  4-0 Monocryl was used to close the skin.   Skin glue was then applied. The patient was then extubated and brought to the recovery  room in stable condition. Instrument, sponge,  and needle counts were correct at closure and at the conclusion of the case.   Findings: Mild chronic Cholecystitis with Cholelithiasis  Estimated Blood Loss: Minimal         Drains: 0         Specimens: Gallbladder           Complications: None; patient tolerated the procedure well.         Disposition: PACU - hemodynamically stable.         Condition: stable

## 2017-08-15 NOTE — Anesthesia Procedure Notes (Signed)
Procedure Name: Intubation Date/Time: 08/15/2017 7:38 AM Performed by: Marena ChancyBeckner, Newton Frutiger S, CRNA Pre-anesthesia Checklist: Patient identified, Emergency Drugs available, Suction available and Patient being monitored Patient Re-evaluated:Patient Re-evaluated prior to induction Oxygen Delivery Method: Circle System Utilized Preoxygenation: Pre-oxygenation with 100% oxygen Induction Type: IV induction and Cricoid Pressure applied Ventilation: Mask ventilation without difficulty Laryngoscope Size: Miller and 2 Grade View: Grade I Tube type: Oral Tube size: 7.0 mm Number of attempts: 1 Airway Equipment and Method: Stylet and Oral airway Placement Confirmation: ETT inserted through vocal cords under direct vision,  positive ETCO2 and breath sounds checked- equal and bilateral Tube secured with: Tape Dental Injury: Teeth and Oropharynx as per pre-operative assessment

## 2017-08-15 NOTE — Progress Notes (Signed)
Pt arrived to pacu with iv infusing from or 

## 2017-08-15 NOTE — Progress Notes (Signed)
Pt sat out of bed in chair ,then ambulated a lap on unit and went back to the chair. Still up in chair at reporting time

## 2017-08-15 NOTE — Anesthesia Preprocedure Evaluation (Signed)
Anesthesia Evaluation  Patient identified by MRN, date of birth, ID band Patient awake    Reviewed: Allergy & Precautions, NPO status , Patient's Chart, lab work & pertinent test results  History of Anesthesia Complications Negative for: history of anesthetic complications  Airway Mallampati: I  TM Distance: >3 FB Neck ROM: Full    Dental  (+) Teeth Intact   Pulmonary neg pulmonary ROS,    breath sounds clear to auscultation       Cardiovascular  Rhythm:Regular     Neuro/Psych  Headaches, PSYCHIATRIC DISORDERS Anxiety Depression  Neuromuscular disease    GI/Hepatic Neg liver ROS, GERD  Medicated,  Endo/Other  negative endocrine ROS  Renal/GU negative Renal ROS     Musculoskeletal  (+) Arthritis ,   Abdominal   Peds  Hematology negative hematology ROS (+)   Anesthesia Other Findings   Reproductive/Obstetrics                             Anesthesia Physical Anesthesia Plan  ASA: II  Anesthesia Plan: General   Post-op Pain Management:    Induction: Intravenous  PONV Risk Score and Plan: 3 and Ondansetron and Dexamethasone  Airway Management Planned: Oral ETT  Additional Equipment: None  Intra-op Plan:   Post-operative Plan: Extubation in OR  Informed Consent: I have reviewed the patients History and Physical, chart, labs and discussed the procedure including the risks, benefits and alternatives for the proposed anesthesia with the patient or authorized representative who has indicated his/her understanding and acceptance.   Dental advisory given  Plan Discussed with: CRNA and Surgeon  Anesthesia Plan Comments:         Anesthesia Quick Evaluation

## 2017-08-15 NOTE — Interval H&P Note (Signed)
History and Physical Interval Note:no change in H and P  08/15/2017 7:09 AM  [employer] CompanyBarbara S [name]  has presented today for surgery, with the diagnosis of SYMPTOMATIC GALLSTONES  The various methods of treatment have been discussed with the patient and family. After consideration of risks, benefits and other options for treatment, the patient has consented to  Procedure(s): LAPAROSCOPIC CHOLECYSTECTOMY (N/A) as a surgical intervention .  The patient's history has been reviewed, patient examined, no change in status, stable for surgery.  I have reviewed the patient's chart and labs.  Questions were answered to the patient's satisfaction.     Randie Tallarico A

## 2017-08-16 ENCOUNTER — Other Ambulatory Visit: Payer: Self-pay

## 2017-08-16 ENCOUNTER — Encounter (HOSPITAL_COMMUNITY): Payer: Self-pay | Admitting: Surgery

## 2017-08-16 DIAGNOSIS — K8010 Calculus of gallbladder with chronic cholecystitis without obstruction: Secondary | ICD-10-CM | POA: Diagnosis not present

## 2017-08-16 MED ORDER — OXYCODONE HCL 5 MG PO TABS: 5.0000 mg | ORAL_TABLET | Freq: Four times a day (QID) | ORAL | 0 refills | Status: DC | PRN

## 2017-08-16 MED ORDER — BISACODYL 10 MG RE SUPP
10.0000 mg | Freq: Once | RECTAL | Status: AC
  Administered 2017-08-16: 10 mg via RECTAL
  Filled 2017-08-16: qty 1

## 2017-08-16 NOTE — Discharge Summary (Signed)
Physician Discharge Summary  Patient ID: Brittany [name] MRN: [id number] DOB/AGE: February 25, 1946 72 y.o.  Admit date: 08/15/2017 Discharge date: 08/16/2017  Admission Diagnoses:  Discharge Diagnoses:  Active Problems:   S/P laparoscopic cholecystectomy   Discharged Condition: good  Hospital Course: uneventful post op recovery.  Discharged home POD#1  Consults: None  Significant Diagnostic Studies:   Treatments: surgery: laparoscopic cholecystectomy  Discharge Exam: Blood pressure 108/63, pulse 89, temperature 99 F (37.2 C), temperature source Oral, resp. rate 16, height 5\' 3"  (1.6 m), weight 61 kg (134 lb 9 oz), SpO2 96 %. General appearance: alert, cooperative and no distress Resp: clear to auscultation bilaterally Cardio: regular rate and rhythm, S1, S2 normal, no murmur, click, rub or gallop Incision/Wound:abdomen soft, incision clean  Disposition: 01-Home or Self Care   Allergies as of 08/16/2017      Reactions   Tetracyclines & Related Other (See Comments)   Throat ulcer   Penicillins    UNSPECIFIED REACTION  Has patient had a PCN reaction causing immediate rash, facial/tongue/throat swelling, SOB or lightheadedness with hypotension: No Has patient had a PCN reaction causing severe rash involving mucus membranes or skin necrosis: NO Has patient had a PCN reaction that required hospitalization No Has patient had a PCN reaction occurring within the last 10 years: No If all of the above answers are "NO", then may proceed with Cephalosporin       Medication List    TAKE these medications   acetaminophen 500 MG tablet Commonly known as:  TYLENOL Take 500 mg by mouth every 6 (six) hours as needed (for pain.).   alendronate 70 MG tablet Commonly known as:  FOSAMAX Take 70 mg by mouth once a week. Take with a full glass of water on an empty stomach.   ALPRAZolam 0.5 MG tablet Commonly known as:  XANAX Take 0.5 mg by mouth 2 (two) times daily.   calcium carbonate  750 MG chewable tablet Commonly known as:  TUMS EX Chew 1 tablet by mouth every evening.   citalopram 40 MG tablet Commonly known as:  CELEXA Take 40 mg by mouth every evening.   COSAMIN DS PO Take 1 tablet by mouth 2 (two) times daily. 1500 mg   docusate sodium 250 MG capsule Commonly known as:  COLACE Take 250 mg by mouth every evening.   Fish Oil 1200 MG Caps Take 1,200 mg by mouth every evening.   gabapentin 300 MG capsule Commonly known as:  NEURONTIN Take 2 capsules (600 mg total) by mouth 3 (three) times daily.   loratadine 10 MG tablet Commonly known as:  CLARITIN Take 10 mg by mouth every evening.   naproxen sodium 220 MG tablet Commonly known as:  ALEVE Take 220 mg by mouth 2 (two) times daily.   omeprazole 40 MG capsule Commonly known as:  PRILOSEC Take 40 mg by mouth daily at 3 pm.   oxyCODONE 5 MG immediate release tablet Commonly known as:  Oxy IR/ROXICODONE Take 1-2 tablets (5-10 mg total) by mouth every 6 (six) hours as needed for moderate pain, severe pain or breakthrough pain.   polyethylene glycol packet Commonly known as:  MIRALAX / GLYCOLAX Take 17 g by mouth every other day.   simvastatin 40 MG tablet Commonly known as:  ZOCOR Take 40 mg by mouth every evening.   tiZANidine 4 MG tablet Commonly known as:  ZANAFLEX Take 4 mg by mouth 2 (two) times daily.   traMADol 50 MG tablet Commonly known as:  ULTRAM Take 100 mg by mouth 2 (two) times daily as needed for moderate pain.   Vitamin D-3 1000 units Caps Take 1,000 Units by mouth every evening.      Follow-up Information    Abigail MiyamotoBlackman, Kyriakos Babler, MD. Schedule an appointment as soon as possible for a visit in 3 week(s).   Specialty:  General Surgery Contact information: [address] [place] [address] [phone]           Signed: Shelly RubensteinBLACKMAN,Josiane Labine A 08/16/2017, 8:39 PM

## 2017-08-16 NOTE — Progress Notes (Signed)
Brittany PainBarbara S [name] to be D/C'd  per MD order. Discussed with the patient and all questions fully answered.  VSS, Skin clean, dry and intact without evidence of skin break down, no evidence of skin tears noted.  IV catheter discontinued intact. Site without signs and symptoms of complications. Dressing and pressure applied.  An After Visit Summary was printed and given to the patient. Patient received prescription.  D/c education completed with patient/family including follow up instructions, medication list, d/c activities limitations if indicated, with other d/c instructions as indicated by MD - patient able to verbalize understanding, all questions fully answered.   Patient instructed to return to ED, call 911, or call MD for any changes in condition.   Patient to be escorted via WC, and D/C home via private auto.

## 2017-08-16 NOTE — Progress Notes (Signed)
Patient ID: Brittany PainBarbara S [name], female   DOB: October 13, 1945, 72 y.o.   MRN: [id number]   Complains of gas [name] Has chronic constipation Lungs clear Abdomen soft, non-distended  Plan: Discharge home today after lunch Needs to ambulate

## 2017-08-16 NOTE — Discharge Instructions (Signed)
[facility] ______[facility] °LAPAROSCOPIC SURGERY: POST OP INSTRUCTIONS °Always review your discharge instruction sheet given to you by the facility where your surgery was performed. °IF YOU HAVE DISABILITY OR FAMILY LEAVE FORMS, YOU MUST BRING THEM TO THE OFFICE FOR PROCESSING.   °DO NOT GIVE THEM TO YOUR DOCTOR. ° °1. A prescription for pain medication may be given to you upon discharge.  Take your pain medication as prescribed, if needed.  If narcotic pain medicine is not needed, then you may take acetaminophen (Tylenol) or ibuprofen (Advil) as needed. °2. Take your usually prescribed medications unless otherwise directed. °3. If you need a refill on your pain medication, please contact your pharmacy.  They will contact our office to request authorization. Prescriptions will not be filled after 5pm or on week-ends. °4. You should follow a light diet the first few days after arrival home, such as soup and crackers, etc.  Be sure to include lots of fluids daily. °5. Most patients will experience some swelling and bruising in the area of the incisions.  Ice packs will help.  Swelling and bruising can take several days to resolve.  °6. It is common to experience some constipation if taking pain medication after surgery.  Increasing fluid intake and taking a stool softener (such as Colace) will usually help or prevent this problem from occurring.  A mild laxative (Milk of Magnesia or Miralax) should be taken according to package instructions if there are no bowel movements after 48 hours. °7. Unless discharge instructions indicate otherwise, you may remove your bandages 24-48 hours after surgery, and you may shower at that time.  You may have steri-strips (small skin tapes) in place directly over the incision.  These strips should be left on the skin for 7-10 days.  If your surgeon used skin glue on the incision, you may shower in 24 hours.  The glue will flake off over the next 2-3 weeks.  Any sutures or  staples will be removed at the office during your follow-up visit. °8. ACTIVITIES:  You may resume regular (light) daily activities beginning the next day--such as daily self-care, walking, climbing stairs--gradually increasing activities as tolerated.  You may have sexual intercourse when it is comfortable.  Refrain from any heavy lifting or straining until approved by your doctor. °a. You may drive when you are no longer taking prescription pain medication, you can comfortably wear a seatbelt, and you can safely maneuver your car and apply brakes. °b. RETURN TO WORK:  __________________________________________________________ °9. You should see your doctor in the office for a follow-up appointment approximately 2-3 weeks after your surgery.  Make sure that you call for this appointment within a day or two after you arrive home to insure a convenient appointment time. °10. OTHER INSTRUCTIONS: __________________________________________________________________________________________________________________________ __________________________________________________________________________________________________________________________ °WHEN TO CALL YOUR DOCTOR: °1. Fever over 101.0 °2. Inability to urinate °3. Continued bleeding from incision. °4. Increased pain, redness, or drainage from the incision. °5. Increasing abdominal pain ° °The clinic staff is available to answer your questions during regular business hours.  Please don’t hesitate to call and ask to speak to one of the nurses for clinical concerns.  If you have a medical emergency, go to the nearest emergency room or call 911.  A surgeon from [facility] is always on call at the hospital. °[address], [address], [place], [place]  [address] ? [address] [address], [place], [place]   [address] °[phone] ? [phone] ? FAX [phone] °Web site:   [email or web address] °

## 2017-08-17 NOTE — Anesthesia Postprocedure Evaluation (Signed)
Anesthesia Post Note  Patient: Brittany [name]  Procedure(s) Performed: LAPAROSCOPIC CHOLECYSTECTOMY (N/A Abdomen)     Patient location during evaluation: PACU Anesthesia Type: General Level of consciousness: awake and alert Pain management: pain level controlled Vital Signs Assessment: post-procedure vital signs reviewed and stable Respiratory status: spontaneous breathing, nonlabored ventilation, respiratory function stable and patient connected to nasal cannula oxygen Cardiovascular status: blood pressure returned to baseline and stable Postop Assessment: no apparent nausea or vomiting Anesthetic complications: no    Last Vitals:  Vitals:   08/16/17 0740 08/16/17 1427  BP:  108/63  Pulse:  89  Resp: 15 16  Temp:  37.2 C  SpO2:  96%    Last Pain:  Vitals:   08/16/17 1445  TempSrc:   PainSc: 7                  Kadrian Partch

## 2017-11-12 IMAGING — CR DG LUMBAR SPINE 2-3V
1 series · 1 of 1 positions shown · non-contrast
Comparison: None.

CLINICAL DATA: Posterior fusion.

EXAM:
LUMBAR SPINE - 2-3 VIEW

[lat]
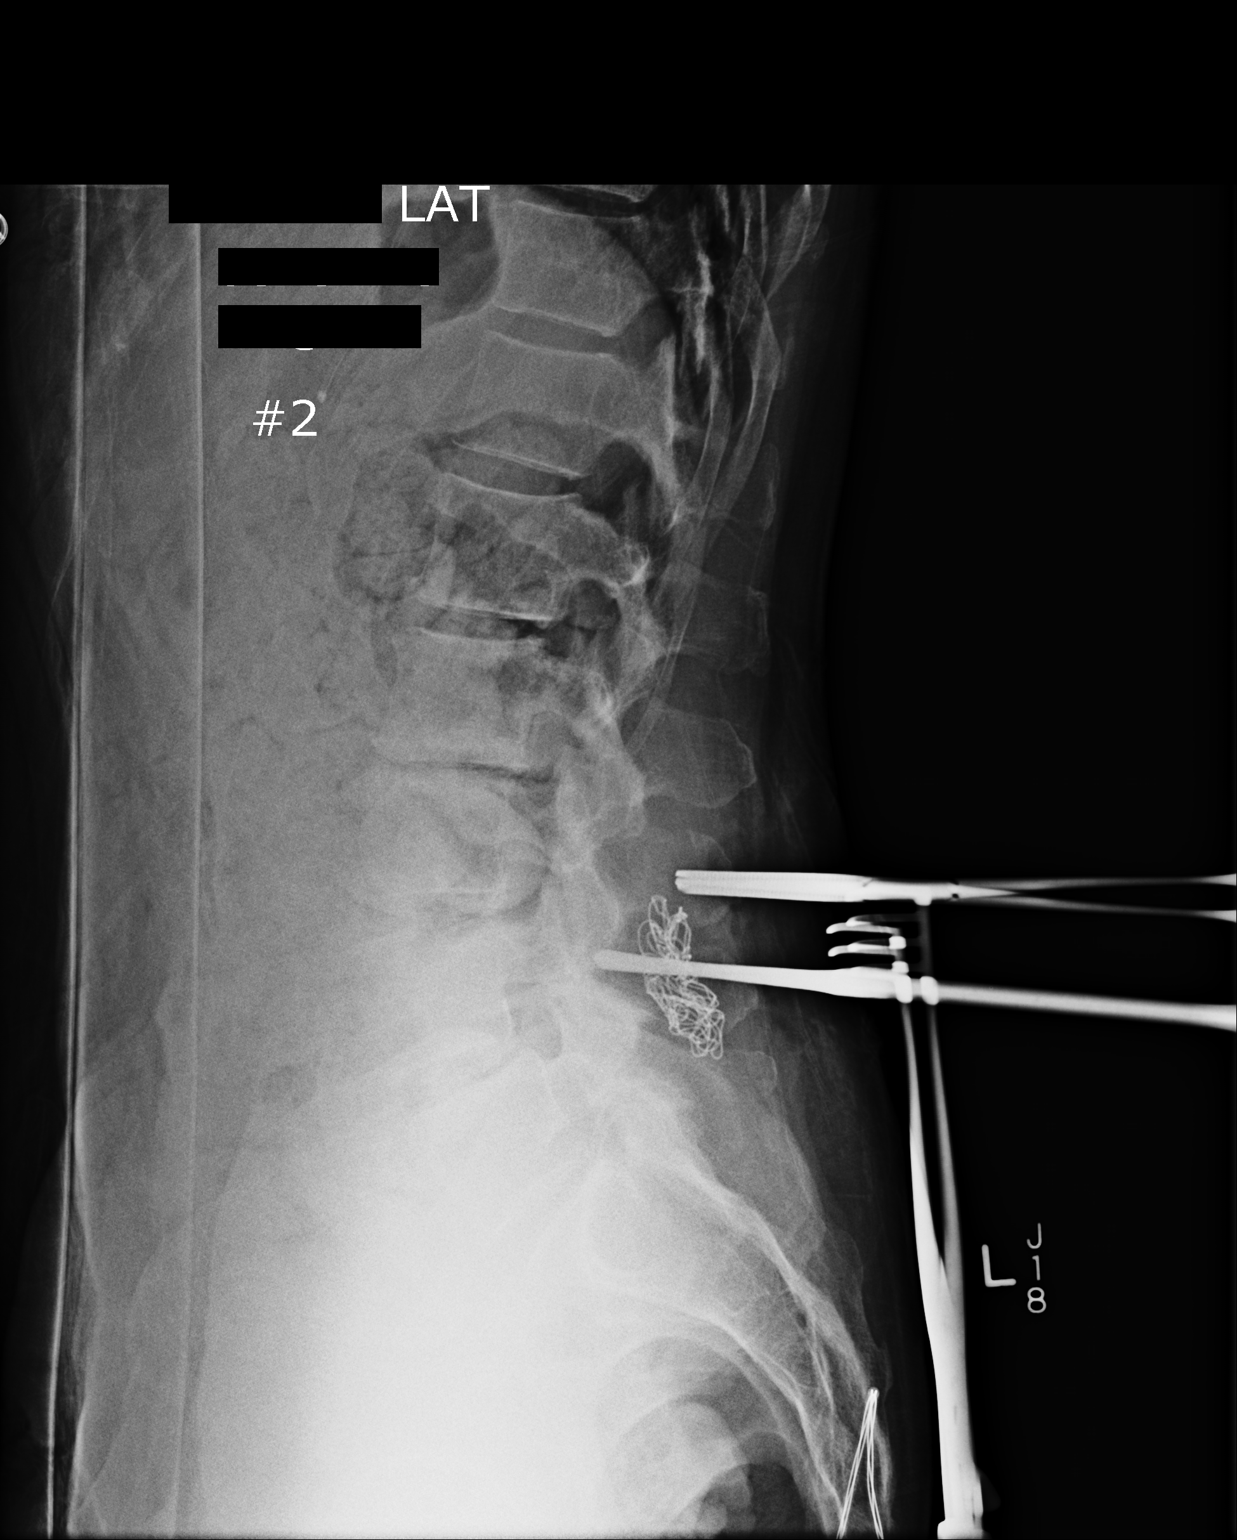

[1 of 1 positions shown; findings below may reference images not displayed]

FINDINGS: Metallic marker noted posteriorly at the L4-L5 disc space. Diffuse
degenerative change. No acute bony abnormality.
IMPRESSION: Metallic marker noted posteriorly at the L4-L5 disc space.

## 2018-03-02 ENCOUNTER — Other Ambulatory Visit: Payer: Self-pay | Admitting: Neurosurgery

## 2018-03-14 ENCOUNTER — Other Ambulatory Visit: Payer: Self-pay | Admitting: Neurosurgery

## 2018-03-14 NOTE — Pre-Procedure Instructions (Signed)
Brittany [name]  03/14/2018      [id number] [place], [place] - [address] AT [facility] OF [address] [place] [address] Phone: [phone] Fax: [phone]  [id number] - [place], [place] - [address] AT [facility] [address] [facility] [address] [place] [address] Phone: [phone] Fax: [phone]    Your procedure is scheduled on March 22, 2018.  Report to [facility] Admitting at 530 AM.  Call this number if you have problems the morning of surgery:  [phone]   Remember:  Do not eat or drink after midnight.    Take these medicines the morning of surgery with A SIP OF WATER  Tylenol-if needed Alprazolam (xanax) Citalopram (celexa) Loratadine (claritin) Omeprazole (prilosec) Oxycodone-if needed for pain Tizanidine (zanaflex) Tramadol (ultram)  7 days prior to surgery STOP taking any Aspirin (unless otherwise instructed by your surgeon), Aleve, Naproxen, Ibuprofen, Motrin, Advil, Goody's, BC's, all herbal medications, fish oil, and all vitamins   Do not wear jewelry, make-up or nail polish.  Do not wear lotions, powders, or perfumes, or deodorant.  Do not shave 48 hours prior to surgery.    Do not bring valuables to the hospital.  [facility] is not responsible for any belongings or valuables.  Contacts, dentures or bridgework may not be worn into surgery.  Leave your suitcase in the car.  After surgery it may be brought to your room.  For patients admitted to the hospital, discharge time will be determined by your treatment team.  Patients discharged the day of surgery will not be allowed to drive home.   [facility]- Preparing For Surgery  Before surgery, you can play an important role. Because skin is not sterile, your skin needs to be as free of germs as possible. You can reduce the number of germs on your skin by washing  with CHG (chlorahexidine gluconate) Soap before surgery.  CHG is an antiseptic cleaner which kills germs and bonds with the skin to continue killing germs even after washing.    Oral Hygiene is also important to reduce your risk of infection.  Remember - BRUSH YOUR TEETH THE MORNING OF SURGERY WITH YOUR REGULAR TOOTHPASTE  Please do not use if you have an allergy to CHG or antibacterial soaps. If your skin becomes reddened/irritated stop using the CHG.  Do not shave (including legs and underarms) for at least 48 hours prior to first CHG shower. It is OK to shave your face.  Please follow these instructions carefully.   1. Shower the NIGHT BEFORE SURGERY and the MORNING OF SURGERY with CHG.   2. If you chose to wash your hair, wash your hair first as usual with your normal shampoo.  3. After you shampoo, rinse your hair and body thoroughly to remove the shampoo.  4. Use CHG as you would any other liquid soap. You can apply CHG directly to the skin and wash gently with a scrungie or a clean washcloth.   5. Apply the CHG Soap to your body ONLY FROM THE NECK DOWN.  Do not use on open wounds or open sores. Avoid contact with your eyes, ears, mouth and genitals (private parts). Wash Face and genitals (private parts)  with your normal soap.  6. Wash thoroughly, paying special attention to the area where your surgery will be performed.  7. Thoroughly rinse your body with warm water from the neck down.  8. DO NOT shower/wash with your normal soap after using and rinsing off the CHG Soap.  9. Pat yourself dry with a CLEAN TOWEL.  10. Wear CLEAN PAJAMAS to bed the night before surgery, wear comfortable clothes the morning of surgery  11. Place CLEAN SHEETS on your bed the night of your first shower and DO NOT SLEEP WITH PETS.  Day of Surgery:  Do not apply any deodorants/lotions.  Please wear clean clothes to the hospital/surgery center.   Remember to brush your teeth WITH YOUR REGULAR  TOOTHPASTE.   Please read over the following fact sheets that you were given. Pain Booklet, Coughing and Deep Breathing, MRSA Information and Surgical Site Infection Prevention

## 2018-03-15 ENCOUNTER — Encounter (HOSPITAL_COMMUNITY)
Admission: RE | Admit: 2018-03-15 | Discharge: 2018-03-15 | Disposition: A | Discharge status: Home / Self Care | Payer: Medicare Other | Source: Ambulatory Visit | Attending: Neurosurgery | Admitting: Neurosurgery

## 2018-03-15 ENCOUNTER — Encounter (HOSPITAL_COMMUNITY): Payer: Self-pay

## 2018-03-15 ENCOUNTER — Other Ambulatory Visit: Payer: Self-pay

## 2018-03-15 DIAGNOSIS — Z01818 Encounter for other preprocedural examination: Secondary | ICD-10-CM | POA: Diagnosis not present

## 2018-03-15 LAB — BASIC METABOLIC PANEL
Anion gap: 11 (ref 5–15)
BUN: 12 mg/dL (ref 8–23)
CALCIUM: 9.6 mg/dL (ref 8.9–10.3)
CO2: 25 mmol/L (ref 22–32)
Chloride: 103 mmol/L (ref 98–111)
Creatinine, Ser: 0.78 mg/dL (ref 0.44–1.00)
GLUCOSE: 101 mg/dL — AB (ref 70–99)
Potassium: 4.5 mmol/L (ref 3.5–5.1)
Sodium: 139 mmol/L (ref 135–145)

## 2018-03-15 LAB — CBC
HEMATOCRIT: 42.4 % (ref 36.0–46.0)
Hemoglobin: 13.5 g/dL (ref 12.0–15.0)
MCH: 30.6 pg (ref 26.0–34.0)
MCHC: 31.8 g/dL (ref 30.0–36.0)
MCV: 96.1 fL (ref 78.0–100.0)
PLATELETS: 209 10*3/uL (ref 150–400)
RBC: 4.41 MIL/uL (ref 3.87–5.11)
RDW: 12.4 % (ref 11.5–15.5)
WBC: 5.3 10*3/uL (ref 4.0–10.5)

## 2018-03-15 LAB — TYPE AND SCREEN
ABO/RH(D): O POS
Antibody Screen: NEGATIVE

## 2018-03-15 LAB — SURGICAL PCR SCREEN
MRSA, PCR: NEGATIVE
Staphylococcus aureus: NEGATIVE

## 2018-03-22 ENCOUNTER — Encounter (HOSPITAL_COMMUNITY): Payer: Self-pay | Admitting: Anesthesiology

## 2018-03-22 ENCOUNTER — Inpatient Hospital Stay (HOSPITAL_COMMUNITY): Payer: Medicare Other

## 2018-03-22 ENCOUNTER — Inpatient Hospital Stay (HOSPITAL_COMMUNITY): Payer: Medicare Other | Admitting: Anesthesiology

## 2018-03-22 ENCOUNTER — Inpatient Hospital Stay (HOSPITAL_COMMUNITY)
Admission: RE | Disposition: A | Discharge status: Home / Self Care | Payer: Self-pay | Source: Home / Self Care | Attending: Neurosurgery

## 2018-03-22 ENCOUNTER — Inpatient Hospital Stay (HOSPITAL_COMMUNITY)
Admission: RE | Admit: 2018-03-22 | Discharge: 2018-03-23 | DRG: 473 | Disposition: A | Discharge status: Home / Self Care | Payer: Medicare Other | Attending: Neurosurgery | Admitting: Neurosurgery

## 2018-03-22 ENCOUNTER — Inpatient Hospital Stay (HOSPITAL_COMMUNITY): Payer: Medicare Other | Admitting: Physician Assistant

## 2018-03-22 DIAGNOSIS — F419 Anxiety disorder, unspecified: Secondary | ICD-10-CM | POA: Diagnosis present

## 2018-03-22 DIAGNOSIS — Z791 Long term (current) use of non-steroidal anti-inflammatories (NSAID): Secondary | ICD-10-CM

## 2018-03-22 DIAGNOSIS — Z9049 Acquired absence of other specified parts of digestive tract: Secondary | ICD-10-CM

## 2018-03-22 DIAGNOSIS — M5020 Other cervical disc displacement, unspecified cervical region: Secondary | ICD-10-CM | POA: Diagnosis present

## 2018-03-22 DIAGNOSIS — F329 Major depressive disorder, single episode, unspecified: Secondary | ICD-10-CM | POA: Diagnosis present

## 2018-03-22 DIAGNOSIS — J45909 Unspecified asthma, uncomplicated: Secondary | ICD-10-CM | POA: Diagnosis present

## 2018-03-22 DIAGNOSIS — M4802 Spinal stenosis, cervical region: Secondary | ICD-10-CM | POA: Diagnosis present

## 2018-03-22 DIAGNOSIS — M4722 Other spondylosis with radiculopathy, cervical region: Secondary | ICD-10-CM | POA: Diagnosis present

## 2018-03-22 DIAGNOSIS — M2578 Osteophyte, vertebrae: Secondary | ICD-10-CM | POA: Diagnosis present

## 2018-03-22 DIAGNOSIS — M1990 Unspecified osteoarthritis, unspecified site: Secondary | ICD-10-CM | POA: Diagnosis present

## 2018-03-22 DIAGNOSIS — Z79899 Other long term (current) drug therapy: Secondary | ICD-10-CM | POA: Diagnosis not present

## 2018-03-22 DIAGNOSIS — M5010 Cervical disc disorder with radiculopathy, unspecified cervical region: Secondary | ICD-10-CM | POA: Diagnosis present

## 2018-03-22 DIAGNOSIS — M810 Age-related osteoporosis without current pathological fracture: Secondary | ICD-10-CM | POA: Diagnosis present

## 2018-03-22 DIAGNOSIS — Z880 Allergy status to penicillin: Secondary | ICD-10-CM

## 2018-03-22 DIAGNOSIS — K5901 Slow transit constipation: Secondary | ICD-10-CM | POA: Diagnosis present

## 2018-03-22 DIAGNOSIS — E785 Hyperlipidemia, unspecified: Secondary | ICD-10-CM | POA: Diagnosis present

## 2018-03-22 DIAGNOSIS — Z419 Encounter for procedure for purposes other than remedying health state, unspecified: Secondary | ICD-10-CM

## 2018-03-22 DIAGNOSIS — Z881 Allergy status to other antibiotic agents status: Secondary | ICD-10-CM | POA: Diagnosis not present

## 2018-03-22 DIAGNOSIS — M542 Cervicalgia: Secondary | ICD-10-CM | POA: Diagnosis present

## 2018-03-22 HISTORY — PX: ANTERIOR CERVICAL DECOMP/DISCECTOMY FUSION: SHX1161

## 2018-03-22 SURGERY — ANTERIOR CERVICAL DECOMPRESSION/DISCECTOMY FUSION 3 LEVELS
Anesthesia: General | Site: Spine Cervical

## 2018-03-22 MED ORDER — ACETAMINOPHEN 10 MG/ML IV SOLN
INTRAVENOUS | Status: DC | PRN
  Administered 2018-03-22: 1000 mg via INTRAVENOUS

## 2018-03-22 MED ORDER — MAGNESIUM HYDROXIDE 400 MG/5ML PO SUSP: 30.0000 mL | Freq: Every day | ORAL | Status: DC | PRN

## 2018-03-22 MED ORDER — PHENYLEPHRINE 40 MCG/ML (10ML) SYRINGE FOR IV PUSH (FOR BLOOD PRESSURE SUPPORT)
PREFILLED_SYRINGE | INTRAVENOUS | Status: AC
  Filled 2018-03-22: qty 10

## 2018-03-22 MED ORDER — HYDROXYZINE HCL 25 MG PO TABS: 50.0000 mg | ORAL_TABLET | ORAL | Status: DC | PRN

## 2018-03-22 MED ORDER — SIMVASTATIN 40 MG PO TABS
40.0000 mg | ORAL_TABLET | Freq: Every evening | ORAL | Status: DC
  Administered 2018-03-22: 40 mg via ORAL
  Filled 2018-03-22 (×2): qty 1

## 2018-03-22 MED ORDER — LIDOCAINE-EPINEPHRINE 1 %-1:100000 IJ SOLN
INTRAMUSCULAR | Status: DC | PRN
  Administered 2018-03-22: 7.5 mL

## 2018-03-22 MED ORDER — OXYCODONE HCL 5 MG PO TABS
5.0000 mg | ORAL_TABLET | Freq: Once | ORAL | Status: AC | PRN
  Administered 2018-03-22: 5 mg via ORAL

## 2018-03-22 MED ORDER — ACETAMINOPHEN 10 MG/ML IV SOLN
INTRAVENOUS | Status: AC
  Filled 2018-03-22: qty 100

## 2018-03-22 MED ORDER — THROMBIN 5000 UNITS EX SOLR
CUTANEOUS | Status: AC
  Filled 2018-03-22: qty 5000

## 2018-03-22 MED ORDER — TIZANIDINE HCL 4 MG PO TABS
4.0000 mg | ORAL_TABLET | Freq: Two times a day (BID) | ORAL | Status: DC
  Administered 2018-03-22 – 2018-03-23 (×2): 4 mg via ORAL
  Filled 2018-03-22 (×2): qty 1

## 2018-03-22 MED ORDER — DEXTROSE-NACL 5-0.45 % IV SOLN: INTRAVENOUS | Status: DC

## 2018-03-22 MED ORDER — DOCUSATE SODIUM 50 MG PO CAPS
250.0000 mg | ORAL_CAPSULE | Freq: Every evening | ORAL | Status: DC
  Administered 2018-03-22: 250 mg via ORAL
  Filled 2018-03-22 (×2): qty 1

## 2018-03-22 MED ORDER — ROCURONIUM BROMIDE 10 MG/ML (PF) SYRINGE
PREFILLED_SYRINGE | INTRAVENOUS | Status: DC | PRN
  Administered 2018-03-22: 40 mg via INTRAVENOUS
  Administered 2018-03-22: 10 mg via INTRAVENOUS
  Administered 2018-03-22: 5 mg via INTRAVENOUS
  Administered 2018-03-22 (×2): 10 mg via INTRAVENOUS

## 2018-03-22 MED ORDER — DIAZEPAM 5 MG/ML IJ SOLN
2.5000 mg | Freq: Once | INTRAMUSCULAR | Status: AC
  Administered 2018-03-22: 2.5 mg via INTRAVENOUS

## 2018-03-22 MED ORDER — SODIUM CHLORIDE 0.9 % IV SOLN
INTRAVENOUS | Status: DC | PRN
  Administered 2018-03-22: 08:00:00

## 2018-03-22 MED ORDER — MENTHOL 3 MG MT LOZG: 1.0000 | LOZENGE | OROMUCOSAL | Status: DC | PRN

## 2018-03-22 MED ORDER — ONDANSETRON HCL 4 MG/2ML IJ SOLN: 4.0000 mg | Freq: Four times a day (QID) | INTRAMUSCULAR | Status: DC | PRN

## 2018-03-22 MED ORDER — THROMBIN 20000 UNITS EX SOLR
CUTANEOUS | Status: DC | PRN
  Administered 2018-03-22: 08:00:00

## 2018-03-22 MED ORDER — OXYCODONE HCL 5 MG/5ML PO SOLN: 5.0000 mg | Freq: Once | ORAL | Status: AC | PRN

## 2018-03-22 MED ORDER — CHLORHEXIDINE GLUCONATE CLOTH 2 % EX PADS: 6.0000 | MEDICATED_PAD | Freq: Once | CUTANEOUS | Status: DC

## 2018-03-22 MED ORDER — VANCOMYCIN HCL IN DEXTROSE 1-5 GM/200ML-% IV SOLN
1000.0000 mg | INTRAVENOUS | Status: AC
  Administered 2018-03-22: 1000 mg via INTRAVENOUS

## 2018-03-22 MED ORDER — DEXAMETHASONE SODIUM PHOSPHATE 10 MG/ML IJ SOLN
INTRAMUSCULAR | Status: AC
  Filled 2018-03-22: qty 1

## 2018-03-22 MED ORDER — FENTANYL CITRATE (PF) 100 MCG/2ML IJ SOLN
25.0000 ug | INTRAMUSCULAR | Status: DC | PRN
  Administered 2018-03-22: 25 ug via INTRAVENOUS

## 2018-03-22 MED ORDER — KETOROLAC TROMETHAMINE 15 MG/ML IJ SOLN
INTRAMUSCULAR | Status: AC
  Filled 2018-03-22: qty 1

## 2018-03-22 MED ORDER — KETOROLAC TROMETHAMINE 30 MG/ML IJ SOLN
15.0000 mg | Freq: Four times a day (QID) | INTRAMUSCULAR | Status: DC
  Administered 2018-03-22 – 2018-03-23 (×4): 15 mg via INTRAVENOUS
  Filled 2018-03-22 (×4): qty 1

## 2018-03-22 MED ORDER — LACTATED RINGERS IV SOLN
INTRAVENOUS | Status: DC | PRN
  Administered 2018-03-22 (×2): via INTRAVENOUS

## 2018-03-22 MED ORDER — HYDROXYZINE HCL 50 MG/ML IM SOLN: 50.0000 mg | INTRAMUSCULAR | Status: DC | PRN

## 2018-03-22 MED ORDER — THROMBIN 5000 UNITS EX SOLR
OROMUCOSAL | Status: DC | PRN
  Administered 2018-03-22: 08:00:00

## 2018-03-22 MED ORDER — DEXAMETHASONE SODIUM PHOSPHATE 10 MG/ML IJ SOLN
INTRAMUSCULAR | Status: DC | PRN
  Administered 2018-03-22: 8 mg via INTRAVENOUS

## 2018-03-22 MED ORDER — DIAZEPAM 5 MG/ML IJ SOLN
INTRAMUSCULAR | Status: AC
  Filled 2018-03-22: qty 2

## 2018-03-22 MED ORDER — MORPHINE SULFATE (PF) 4 MG/ML IV SOLN: 4.0000 mg | INTRAVENOUS | Status: DC | PRN

## 2018-03-22 MED ORDER — ONDANSETRON HCL 4 MG/2ML IJ SOLN
INTRAMUSCULAR | Status: AC
  Filled 2018-03-22: qty 2

## 2018-03-22 MED ORDER — ACETAMINOPHEN 500 MG PO TABS
500.0000 mg | ORAL_TABLET | Freq: Four times a day (QID) | ORAL | Status: DC | PRN
  Administered 2018-03-22 – 2018-03-23 (×3): 1000 mg via ORAL
  Filled 2018-03-22 (×3): qty 2

## 2018-03-22 MED ORDER — ROCURONIUM BROMIDE 50 MG/5ML IV SOSY
PREFILLED_SYRINGE | INTRAVENOUS | Status: AC
  Filled 2018-03-22: qty 5

## 2018-03-22 MED ORDER — EPHEDRINE SULFATE-NACL 50-0.9 MG/10ML-% IV SOSY: PREFILLED_SYRINGE | INTRAVENOUS | Status: DC | PRN

## 2018-03-22 MED ORDER — CYCLOBENZAPRINE HCL 5 MG PO TABS
5.0000 mg | ORAL_TABLET | Freq: Three times a day (TID) | ORAL | Status: DC | PRN
  Administered 2018-03-22: 10 mg via ORAL

## 2018-03-22 MED ORDER — KETOROLAC TROMETHAMINE 30 MG/ML IJ SOLN
15.0000 mg | Freq: Once | INTRAMUSCULAR | Status: AC
  Administered 2018-03-22: 15 mg via INTRAVENOUS

## 2018-03-22 MED ORDER — EPHEDRINE SULFATE-NACL 50-0.9 MG/10ML-% IV SOSY
PREFILLED_SYRINGE | INTRAVENOUS | Status: DC | PRN
  Administered 2018-03-22 (×2): 10 mg via INTRAVENOUS

## 2018-03-22 MED ORDER — SODIUM CHLORIDE 0.9% FLUSH
3.0000 mL | Freq: Two times a day (BID) | INTRAVENOUS | Status: DC
  Administered 2018-03-22 (×2): 3 mL via INTRAVENOUS

## 2018-03-22 MED ORDER — ACETAMINOPHEN 650 MG RE SUPP: 650.0000 mg | RECTAL | Status: DC | PRN

## 2018-03-22 MED ORDER — LORATADINE 10 MG PO TABS
10.0000 mg | ORAL_TABLET | Freq: Every evening | ORAL | Status: DC
  Administered 2018-03-22 – 2018-03-23 (×2): 10 mg via ORAL
  Filled 2018-03-22 (×2): qty 1

## 2018-03-22 MED ORDER — CLONAZEPAM 0.5 MG PO TABS
0.5000 mg | ORAL_TABLET | Freq: Two times a day (BID) | ORAL | Status: DC
  Administered 2018-03-22 – 2018-03-23 (×2): 0.5 mg via ORAL
  Filled 2018-03-22 (×2): qty 1

## 2018-03-22 MED ORDER — THROMBIN (RECOMBINANT) 20000 UNITS EX SOLR
CUTANEOUS | Status: AC
  Filled 2018-03-22: qty 20000

## 2018-03-22 MED ORDER — FENTANYL CITRATE (PF) 100 MCG/2ML IJ SOLN
INTRAMUSCULAR | Status: AC
  Filled 2018-03-22: qty 2

## 2018-03-22 MED ORDER — CYCLOBENZAPRINE HCL 10 MG PO TABS
ORAL_TABLET | ORAL | Status: AC
  Filled 2018-03-22: qty 1

## 2018-03-22 MED ORDER — SUCCINYLCHOLINE CHLORIDE 200 MG/10ML IV SOSY
PREFILLED_SYRINGE | INTRAVENOUS | Status: AC
  Filled 2018-03-22: qty 10

## 2018-03-22 MED ORDER — FENTANYL CITRATE (PF) 250 MCG/5ML IJ SOLN
INTRAMUSCULAR | Status: DC | PRN
  Administered 2018-03-22: 50 ug via INTRAVENOUS
  Administered 2018-03-22: 100 ug via INTRAVENOUS
  Administered 2018-03-22: 25 ug via INTRAVENOUS

## 2018-03-22 MED ORDER — OXYCODONE HCL 5 MG PO TABS
ORAL_TABLET | ORAL | Status: AC
  Filled 2018-03-22: qty 1

## 2018-03-22 MED ORDER — ALUM & MAG HYDROXIDE-SIMETH 200-200-20 MG/5ML PO SUSP: 30.0000 mL | Freq: Four times a day (QID) | ORAL | Status: DC | PRN

## 2018-03-22 MED ORDER — PROPOFOL 10 MG/ML IV BOLUS
INTRAVENOUS | Status: DC | PRN
  Administered 2018-03-22: 80 mg via INTRAVENOUS

## 2018-03-22 MED ORDER — ONDANSETRON HCL 4 MG/2ML IJ SOLN
INTRAMUSCULAR | Status: DC | PRN
  Administered 2018-03-22: 4 mg via INTRAVENOUS

## 2018-03-22 MED ORDER — SODIUM CHLORIDE 0.9% FLUSH: 3.0000 mL | INTRAVENOUS | Status: DC | PRN

## 2018-03-22 MED ORDER — SODIUM CHLORIDE 0.9 % IV SOLN
INTRAVENOUS | Status: DC | PRN
  Administered 2018-03-22: 20 ug/min via INTRAVENOUS

## 2018-03-22 MED ORDER — BUPIVACAINE HCL (PF) 0.5 % IJ SOLN
INTRAMUSCULAR | Status: AC
  Filled 2018-03-22: qty 30

## 2018-03-22 MED ORDER — VANCOMYCIN HCL IN DEXTROSE 1-5 GM/200ML-% IV SOLN
INTRAVENOUS | Status: AC
  Administered 2018-03-22: 1000 mg via INTRAVENOUS
  Filled 2018-03-22: qty 200

## 2018-03-22 MED ORDER — GENTAMICIN IN SALINE 1.6-0.9 MG/ML-% IV SOLN
80.0000 mg | INTRAVENOUS | Status: AC
  Administered 2018-03-22: 80 mg via INTRAVENOUS
  Filled 2018-03-22: qty 50

## 2018-03-22 MED ORDER — FENTANYL CITRATE (PF) 250 MCG/5ML IJ SOLN
INTRAMUSCULAR | Status: AC
  Filled 2018-03-22: qty 5

## 2018-03-22 MED ORDER — BUPIVACAINE HCL (PF) 0.5 % IJ SOLN
INTRAMUSCULAR | Status: DC | PRN
  Administered 2018-03-22: 7.5 mL

## 2018-03-22 MED ORDER — 0.9 % SODIUM CHLORIDE (POUR BTL) OPTIME
TOPICAL | Status: DC | PRN
  Administered 2018-03-22: 1000 mL

## 2018-03-22 MED ORDER — PHENOL 1.4 % MT LIQD: 1.0000 | OROMUCOSAL | Status: DC | PRN

## 2018-03-22 MED ORDER — PROPOFOL 10 MG/ML IV BOLUS
INTRAVENOUS | Status: AC
  Filled 2018-03-22: qty 20

## 2018-03-22 MED ORDER — MIDAZOLAM HCL 5 MG/5ML IJ SOLN
INTRAMUSCULAR | Status: DC | PRN
  Administered 2018-03-22: 1 mg via INTRAVENOUS

## 2018-03-22 MED ORDER — LIDOCAINE 2% (20 MG/ML) 5 ML SYRINGE
INTRAMUSCULAR | Status: AC
  Filled 2018-03-22: qty 5

## 2018-03-22 MED ORDER — FLEET ENEMA 7-19 GM/118ML RE ENEM: 1.0000 | ENEMA | Freq: Once | RECTAL | Status: DC | PRN

## 2018-03-22 MED ORDER — MIDAZOLAM HCL 2 MG/2ML IJ SOLN
INTRAMUSCULAR | Status: AC
  Filled 2018-03-22: qty 2

## 2018-03-22 MED ORDER — ACETAMINOPHEN 325 MG PO TABS: 650.0000 mg | ORAL_TABLET | ORAL | Status: DC | PRN

## 2018-03-22 MED ORDER — POLYETHYLENE GLYCOL 3350 17 G PO PACK
17.0000 g | PACK | Freq: Every day | ORAL | Status: DC | PRN
  Administered 2018-03-22: 17 g via ORAL
  Filled 2018-03-22: qty 1

## 2018-03-22 MED ORDER — SODIUM CHLORIDE 0.9 % IV SOLN: 250.0000 mL | INTRAVENOUS | Status: DC

## 2018-03-22 MED ORDER — BISACODYL 10 MG RE SUPP: 10.0000 mg | Freq: Every day | RECTAL | Status: DC | PRN

## 2018-03-22 MED ORDER — FLUOXETINE HCL 20 MG PO CAPS
40.0000 mg | ORAL_CAPSULE | Freq: Every day | ORAL | Status: DC
  Administered 2018-03-23: 40 mg via ORAL
  Filled 2018-03-22: qty 2

## 2018-03-22 MED ORDER — HYDROCODONE-ACETAMINOPHEN 5-325 MG PO TABS: 1.0000 | ORAL_TABLET | ORAL | Status: DC | PRN

## 2018-03-22 MED ORDER — EPHEDRINE 5 MG/ML INJ
INTRAVENOUS | Status: AC
  Filled 2018-03-22: qty 10

## 2018-03-22 MED ORDER — LIDOCAINE 2% (20 MG/ML) 5 ML SYRINGE
INTRAMUSCULAR | Status: DC | PRN
  Administered 2018-03-22: 40 mg via INTRAVENOUS

## 2018-03-22 MED ORDER — LIDOCAINE-EPINEPHRINE 1 %-1:100000 IJ SOLN
INTRAMUSCULAR | Status: AC
  Filled 2018-03-22: qty 1

## 2018-03-22 MED ORDER — SUGAMMADEX SODIUM 200 MG/2ML IV SOLN
INTRAVENOUS | Status: DC | PRN
  Administered 2018-03-22: 200 mg via INTRAVENOUS

## 2018-03-22 MED ORDER — VITAMIN D 1000 UNITS PO TABS
2000.0000 [IU] | ORAL_TABLET | Freq: Every evening | ORAL | Status: DC
  Administered 2018-03-22: 2000 [IU] via ORAL
  Filled 2018-03-22: qty 2

## 2018-03-22 SURGICAL SUPPLY — 65 items
ADH SKN CLS APL DERMABOND .7 (GAUZE/BANDAGES/DRESSINGS) ×1
ALLOGRAFT 5X14X11 (Bone Implant) ×3 IMPLANT
BAG DECANTER FOR FLEXI CONT (MISCELLANEOUS) ×2 IMPLANT
BIT DRILL 13 (BIT) ×1 IMPLANT
BIT DRILL NEURO 2X3.1 SFT TUCH (MISCELLANEOUS) ×1 IMPLANT
BLADE ULTRA TIP 2M (BLADE) ×1 IMPLANT
CANISTER SUCT 3000ML PPV (MISCELLANEOUS) ×2 IMPLANT
CARTRIDGE OIL MAESTRO DRILL (MISCELLANEOUS) ×1 IMPLANT
CONT SPEC 4OZ CLIKSEAL STRL BL (MISCELLANEOUS) ×1 IMPLANT
COVER MAYO STAND STRL (DRAPES) ×2 IMPLANT
DECANTER SPIKE VIAL GLASS SM (MISCELLANEOUS) ×2 IMPLANT
DERMABOND ADVANCED (GAUZE/BANDAGES/DRESSINGS) ×1
DERMABOND ADVANCED .7 DNX12 (GAUZE/BANDAGES/DRESSINGS) ×1 IMPLANT
DIFFUSER DRILL AIR PNEUMATIC (MISCELLANEOUS) ×2 IMPLANT
DRAPE HALF SHEET 40X57 (DRAPES) ×1 IMPLANT
DRAPE INCISE IOBAN 66X45 STRL (DRAPES) ×2 IMPLANT
DRAPE LAPAROTOMY 100X72 PEDS (DRAPES) ×2 IMPLANT
DRAPE MICROSCOPE LEICA (MISCELLANEOUS) ×2 IMPLANT
DRAPE POUCH INSTRU U-SHP 10X18 (DRAPES) ×2 IMPLANT
DRILL NEURO 2X3.1 SOFT TOUCH (MISCELLANEOUS) ×2
ELECT COATED BLADE 2.86 ST (ELECTRODE) ×2 IMPLANT
ELECT REM PT RETURN 9FT ADLT (ELECTROSURGICAL) ×2
ELECTRODE REM PT RTRN 9FT ADLT (ELECTROSURGICAL) ×1 IMPLANT
GLOVE BIO SURGEON STRL SZ 6.5 (GLOVE) ×3 IMPLANT
GLOVE BIO SURGEON STRL SZ8 (GLOVE) ×1 IMPLANT
GLOVE BIOGEL PI IND STRL 6.5 (GLOVE) IMPLANT
GLOVE BIOGEL PI IND STRL 8 (GLOVE) ×1 IMPLANT
GLOVE BIOGEL PI INDICATOR 6.5 (GLOVE) ×3
GLOVE BIOGEL PI INDICATOR 8 (GLOVE) ×1
GLOVE ECLIPSE 7.5 STRL STRAW (GLOVE) ×3 IMPLANT
GOWN STRL REUS W/ TWL LRG LVL3 (GOWN DISPOSABLE) ×2 IMPLANT
GOWN STRL REUS W/ TWL XL LVL3 (GOWN DISPOSABLE) ×1 IMPLANT
GOWN STRL REUS W/TWL 2XL LVL3 (GOWN DISPOSABLE) IMPLANT
GOWN STRL REUS W/TWL LRG LVL3 (GOWN DISPOSABLE) ×4
GOWN STRL REUS W/TWL XL LVL3 (GOWN DISPOSABLE) ×4
HALTER HD/CHIN CERV TRACTION D (MISCELLANEOUS) ×2 IMPLANT
HEMOSTAT POWDER KIT SURGIFOAM (HEMOSTASIS) ×2 IMPLANT
KIT BASIN OR (CUSTOM PROCEDURE TRAY) ×2 IMPLANT
KIT TURNOVER KIT B (KITS) ×2 IMPLANT
NDL HYPO 25X1 1.5 SAFETY (NEEDLE) ×1 IMPLANT
NDL SPNL 22GX3.5 QUINCKE BK (NEEDLE) ×1 IMPLANT
NEEDLE HYPO 25X1 1.5 SAFETY (NEEDLE) ×2 IMPLANT
NEEDLE SPNL 22GX3.5 QUINCKE BK (NEEDLE) ×2 IMPLANT
NS IRRIG 1000ML POUR BTL (IV SOLUTION) ×2 IMPLANT
OIL CARTRIDGE MAESTRO DRILL (MISCELLANEOUS) ×2
PACK LAMINECTOMY NEURO (CUSTOM PROCEDURE TRAY) ×2 IMPLANT
PAD ARMBOARD 7.5X6 YLW CONV (MISCELLANEOUS) ×6 IMPLANT
PLATE 3 55XLCK NS SPNE CVD (Plate) IMPLANT
PLATE 3 ATLANTIS TRANS (Plate) ×2 IMPLANT
RUBBERBAND STERILE (MISCELLANEOUS) ×4 IMPLANT
SCREW ST 14X4XST FXANG SPNE (Screw) IMPLANT
SCREW ST 15X4XST FXANG NS (Screw) IMPLANT
SCREW ST 16X4XST FXANG NS (Screw) ×2 IMPLANT
SCREW ST FIX 4 ATL (Screw) ×16 IMPLANT
SPONGE INTESTINAL PEANUT (DISPOSABLE) ×2 IMPLANT
SPONGE SURGIFOAM ABS GEL 100 (HEMOSTASIS) ×2 IMPLANT
STAPLER SKIN PROX WIDE 3.9 (STAPLE) IMPLANT
SUT VIC AB 0 CT1 18XCR BRD8 (SUTURE) IMPLANT
SUT VIC AB 0 CT1 8-18 (SUTURE)
SUT VIC AB 2-0 CP2 18 (SUTURE) ×2 IMPLANT
SUT VIC AB 3-0 SH 8-18 (SUTURE) ×2 IMPLANT
SYR BULB 3OZ (MISCELLANEOUS) ×2 IMPLANT
TOWEL GREEN STERILE (TOWEL DISPOSABLE) ×2 IMPLANT
TOWEL GREEN STERILE FF (TOWEL DISPOSABLE) IMPLANT
WATER STERILE IRR 1000ML POUR (IV SOLUTION) ×2 IMPLANT

## 2018-03-22 NOTE — Progress Notes (Signed)
Dr Chaney MallingHodierne fully updated-OK to tx pt to room.

## 2018-03-22 NOTE — Op Note (Signed)
03/22/2018  10:49 AM  PATIENT:  Brittany [name]  72 y.o. female  PRE-OPERATIVE DIAGNOSIS: Cervical HNP, cervical stenosis, cervical spondylosis, cervical degenerative disc disease, cervicalgia, cervical radiculopathy  POST-OPERATIVE DIAGNOSIS:  Cervical HNP, cervical stenosis, cervical spondylosis, cervical degenerative disc disease, cervicalgia, cervical radiculopathy  PROCEDURE:  Procedure(s): C4-5, C5-6, and C6-7 anterior cervical decompression and arthrodesis with structural allograft and Medtronic Atlantis translational cervical plating  SURGEON:  Surgeon(s): Shirlean Kelly, MD  ASSISTANTS: Marikay Alar, MD  ANESTHESIA:   general  EBL:  Total I/O In: -  Out: 475 [Urine:325; Blood:150]  BLOOD ADMINISTERED:none  COUNT:  Correct per nursing staff  DICTATION: Patient was brought to the operating room placed under general endotracheal anesthesia. Patient was placed in 10 pounds of halter traction. The neck was prepped with Betadine soap and solution and draped in a sterile fashion. A obliquel incision was made on the left side of the neck paralleling the anterior border of the sternocleidomastoid.. The line of the incision was infiltrated with local anesthetic with epinephrine. Dissection was carried down thru the subcutaneous tissue and platysma, bipolar cautery was used to maintain hemostasis. Dissection was then carried out thru an avascular plane leaving the sternocleidomastoid carotid artery and jugular vein laterally and the trachea and esophagus medially. The ventral aspect of the vertebral column was identified and a localizing x-ray was taken. The C4-5, C5-6, and C6-7 levels were identified. The annulus at each level was incised and the disc space entered.  There was significant ventral osteophytic overgrowth at each level, that was carefully removed using the high-speed drill, Kerrison punches, and the osteophyte removal tool.  Discectomy was performed with micro-curettes and  pituitary rongeurs. The operating microscope was draped and brought into the field provided additional magnification illumination and visualization. Discectomy was continued posteriorly thru the disc space and then the cartilaginous endplate was removed using micro-curettes along with the high-speed drill. Posterior osteophytic overgrowth was removed at each level using the high-speed drill along with a 2 mm thin footplated Kerrison punch. Posterior longitudinal ligament along with disc herniation was carefully removed, decompressing the spinal canal and thecal sac. We then continued to remove osteophytic overgrowth and disc material decompressing the neural foramina and exiting nerve roots bilaterally. Once the decompression was completed hemostasis was established at each level with the use of Gelfoam with thrombin and bipolar cautery. The Gelfoam was removed, a thin layer of Surgifoam applied, the wound irrigated and hemostasis confirmed. We then measured the height of each intravertebral disc space level and selected a 5 millimeter in height structural allograft for the C4-5 level, a 5 millimeter in height structural allograft for the C5-6 level, and a 5 millimeter in height structural allograft for the C6-7 level . Each was hydrated in saline solution and then gently positioned in the intravertebral disc space and countersunk. We then selected a 55 millimeter in height Medtronic Atlantis translational cervical plate. It was positioned over the fusion construct and secured to the vertebra with a variety of self-tapping screws. Each screw hole was started with the high-speed drill and then the screws placed, once all the screws were placed, the locking system was secured.  We used a pair of 4 x 15 mm screws at C4, a pair of 4 x 16 mm screws at C5, a 4 x 14 mm screw on the left and a 4 x 15 mm screw on the right at C6, and a pair of 4 x 15 m screws at C7.  The wound was  irrigated with bacitracin solution checked  for hemostasis which was established and confirmed. An x-ray was taken which showed the grafts in good position, the plate and screws in good position, and the overall alignment to be improved as compared to prior to surgery. We then proceeded with closure. The platysma was closed with interrupted inverted 2-0 undyed Vicryl suture, the subcutaneous and subcuticular closed with interrupted inverted 3-0 undyed Vicryl suture. The skin edges were approximated with Dermabond. Following surgery the patient was taken out of cervical traction. To be reversed and the anesthetic and taken to the recovery room for further care.   PLAN OF CARE: Admit to inpatient   PATIENT DISPOSITION:  PACU - hemodynamically stable.   Delay start of Pharmacological VTE agent (>24hrs) due to surgical blood loss or risk of bleeding:  yes

## 2018-03-22 NOTE — Anesthesia Procedure Notes (Signed)
Procedure Name: Intubation Date/Time: 03/22/2018 7:43 AM Performed by: Hart RobinsonsByrd, Rahsaan Weakland R, CRNA Pre-anesthesia Checklist: Patient identified, Emergency Drugs available, Suction available and Patient being monitored Patient Re-evaluated:Patient Re-evaluated prior to induction Oxygen Delivery Method: Circle System Utilized Preoxygenation: Pre-oxygenation with 100% oxygen Induction Type: IV induction Ventilation: Mask ventilation without difficulty Laryngoscope Size: Miller and 2 Grade View: Grade I Tube type: Oral Tube size: 7.0 mm Number of attempts: 1 Airway Equipment and Method: Stylet and Oral airway Placement Confirmation: ETT inserted through vocal cords under direct vision,  positive ETCO2 and breath sounds checked- equal and bilateral Secured at: 22 cm Tube secured with: Tape Dental Injury: Teeth and Oropharynx as per pre-operative assessment  Comments: Pt with good neck ROM - cspine neutrality maintained, no change in dentition from pre-procedure

## 2018-03-22 NOTE — Anesthesia Preprocedure Evaluation (Addendum)
Anesthesia Evaluation  Patient identified by MRN, date of birth, ID band Patient awake    Reviewed: Allergy & Precautions, H&P , NPO status , Patient's Chart, lab work & pertinent test results  Airway Mallampati: II   Neck ROM: full    Dental  (+) Dental Advisory Given   Pulmonary asthma ,    breath sounds clear to auscultation       Cardiovascular negative cardio ROS   Rhythm:regular Rate:Normal     Neuro/Psych  Headaches, PSYCHIATRIC DISORDERS Anxiety Depression  Neuromuscular disease    GI/Hepatic   Endo/Other    Renal/GU      Musculoskeletal  (+) Arthritis ,   Abdominal   Peds  Hematology   Anesthesia Other Findings   Reproductive/Obstetrics                            Anesthesia Physical Anesthesia Plan  ASA: II  Anesthesia Plan: General   Post-op Pain Management:    Induction: Intravenous  PONV Risk Score and Plan: 3 and Ondansetron, Dexamethasone, Midazolam and Treatment may vary due to age or medical condition  Airway Management Planned: Oral ETT  Additional Equipment:   Intra-op Plan:   Post-operative Plan: Extubation in OR  Informed Consent: I have reviewed the patients History and Physical, chart, labs and discussed the procedure including the risks, benefits and alternatives for the proposed anesthesia with the patient or authorized representative who has indicated his/her understanding and acceptance.     Plan Discussed with: CRNA, Anesthesiologist and Surgeon  Anesthesia Plan Comments:         Anesthesia Quick Evaluation

## 2018-03-22 NOTE — Transfer of Care (Signed)
Immediate Anesthesia Transfer of Care Note  Patient: Brittany PainBarbara S Betts  Procedure(s) Performed: ANTERIOR CERVICAL DECOMPRESSION/DISCECTOMY FUSION CERVICAL FOUR - CERVICAL FIVE, CERVICAL FIVE - CERVICAL SIX, CERVICAL SIX- CERVICAL SEVEN (N/A Spine Cervical)  Patient Location: PACU  Anesthesia Type:General  Level of Consciousness: awake, alert  and oriented  Airway & Oxygen Therapy: Patient Spontanous Breathing  Post-op Assessment: Report given to RN and Post -op Vital signs reviewed and stable  Post vital signs: Reviewed and stable  Last Vitals:  Vitals Value Taken Time  BP    Temp    Pulse 83 03/22/2018 11:10 AM  Resp 23 03/22/2018 11:10 AM  SpO2 95 % 03/22/2018 11:10 AM  Vitals shown include unvalidated device data.  Last [name]:  Vitals:   03/22/18 0624  TempSrc:   PainSc: 1          Complications: No apparent anesthesia complications

## 2018-03-22 NOTE — Progress Notes (Signed)
Vitals:   03/22/18 1230 03/22/18 1240 03/22/18 1304 03/22/18 1635  BP:  (!) 99/43 (!) 103/53 (!) 103/54  Pulse: 71 73 70 83  Resp: 19 20 16 16   Temp: (!) 97.4 F (36.3 C)  97.6 F (36.4 C) 97.7 F (36.5 C)  TempSrc:   Oral Oral  SpO2: 95% 95% 99% 96%  Height:        Patient resting comfortably in bed.  Has been up and ambulating actively.  Wound clean and dry.  Voiding well.  Comfortable.  Plan: Doing well following surgery.  Encouraged to ambulate.  Continue to progress through postoperative recovery.  Hewitt ShortsNUDELMAN,ROBERT W, MD 03/22/2018, 7:13 PM

## 2018-03-22 NOTE — H&P (Signed)
Subjective: Patient is a 72 y.o. right-handed white female who is admitted for treatment of multilevel cervical spondylosis and degenerative disease who has been having worsening neck pain and bilateral cervical radicular pain extending into the shoulders and arms bilaterally.  She is undergone physical therapy without improvement.  She describes that she is developed depression because of the pain and her antidepressants have been adjusted.  She is admitted now for 3 level C4-5 C5-6 and C6-7 anterior cervical decompression and arthrodesis with structural allograft and cervical plating.   Patient Active Problem List   Diagnosis Date Noted  . S/P laparoscopic cholecystectomy 08/15/2017  . Lumbar stenosis with neurogenic claudication 10/05/2015   Past Medical History:  Diagnosis Date  . Anemia   . Anxiety   . Arthritis   . Asthma    as child  no problem since  . Degenerative spondylolisthesis    L4-5, grade 1  . Depression   . Headache    hx migraines  . HLD (hyperlipidemia)   . Hypercholesteremia   . Left lumbar radiculopathy   . Lumbago   . Lumbar degenerative disc disease   . Lumbar spondylosis   . Lumbar stenosis with neurogenic claudication    L4-5  . Osteoporosis   . Post-nasal drip   . Slow transit constipation   . Spinal stenosis   . Vitamin D deficiency   . Weakness of both lower extremities     Past Surgical History:  Procedure Laterality Date  . BACK SURGERY    . CHOLECYSTECTOMY N/A 08/15/2017   Procedure: LAPAROSCOPIC CHOLECYSTECTOMY;  Surgeon: Abigail MiyamotoBlackman, Douglas, MD;  [facility];  Service: General;  Laterality: N/A;  . EYE SURGERY     bil cataract  . TONSILLECTOMY     t+a    Medications Prior to Admission  Medication Sig Dispense Refill Last Dose  . acetaminophen (TYLENOL) 500 MG tablet Take 500 mg by mouth every 6 (six) hours as needed (for pain.).    03/22/2018 at Unknown time  . calcium carbonate (TUMS EX) 750 MG chewable tablet Chew 1 tablet by mouth  every evening.   03/21/2018 at Unknown time  . Cholecalciferol (VITAMIN D-3) 1000 units CAPS Take 2,000 Units by mouth every evening.    03/21/2018 at Unknown time  . clonazePAM (KLONOPIN) 1 MG tablet Take 0.5-1 mg by mouth 2 (two) times daily.  0 03/22/2018 at Unknown time  . docusate sodium (COLACE) 250 MG capsule Take 250 mg by mouth every evening.   03/21/2018 at Unknown time  . FLUoxetine (PROZAC) 40 MG capsule Take 40 mg by mouth daily.  5 03/22/2018 at Unknown time  . loratadine (CLARITIN) 10 MG tablet Take 10 mg by mouth every evening.    03/22/2018 at Unknown time  . naproxen sodium (ALEVE) 220 MG tablet Take 220 mg by mouth 2 (two) times daily.   Past Week at Unknown time  . NON FORMULARY Take 1 tablet by mouth daily. AgmaSet 890mg    Past Week at Unknown time  . Omega-3 Fatty Acids (FISH OIL) 1200 MG CAPS Take 1,200 mg by mouth every evening.    Past Week at Unknown time  . polyethylene glycol (MIRALAX / GLYCOLAX) packet Take 17 g by mouth every other day.    03/21/2018 at Unknown time  . polyethylene glycol powder (GLYCOLAX/MIRALAX) powder Take 1 Container by mouth daily.   03/21/2018 at Unknown time  . simvastatin (ZOCOR) 40 MG tablet Take 40 mg by mouth every evening.   03/21/2018  at Unknown time  . tiZANidine (ZANAFLEX) 4 MG tablet Take 4 mg by mouth 2 (two) times daily.  0 03/22/2018 at Unknown time  . alendronate (FOSAMAX) 70 MG tablet Take 70 mg by mouth once a week. Take with a full glass of water on an empty stomach.   Unknown at Unknown time  . gabapentin (NEURONTIN) 300 MG capsule Take 2 capsules (600 mg total) by mouth 3 (three) times daily. (Patient not taking: Reported on 08/07/2017) 180 capsule 3 Not Taking at Unknown time  . oxyCODONE (OXY IR/ROXICODONE) 5 MG immediate release tablet Take 1-2 tablets (5-10 mg total) by mouth every 6 (six) hours as needed for moderate pain, severe pain or breakthrough pain. (Patient not taking: Reported on 03/15/2018) 30 tablet 0 Not Taking at Unknown time    Allergies  Allergen Reactions  . Tetracyclines & Related Other (See Comments)    Throat ulcer  . Penicillins     UNSPECIFIED REACTION  Has patient had a PCN reaction causing immediate rash, facial/tongue/throat swelling, SOB or lightheadedness with hypotension: No Has patient had a PCN reaction causing severe rash involving mucus membranes or skin necrosis: NO Has patient had a PCN reaction that required hospitalization No Has patient had a PCN reaction occurring within the last 10 years: No If all of the above answers are "NO", then may proceed with Cephalosporin     Social History   Tobacco Use  . Smoking status: Never Smoker  . Smokeless tobacco: Never Used  Substance Use Topics  . Alcohol use: No    History reviewed. No pertinent family history.   Review of Systems Pertinent items noted in HPI and remainder of comprehensive ROS otherwise negative.  Objective: Vital signs in last 24 hours: Temp:  [98 F (36.7 C)] 98 F (36.7 C) (09/12 0609) Pulse Rate:  [64] 64 (09/12 0609) Resp:  [20] 20 (09/12 0609) BP: (87)/(51) 87/51 (09/12 0609) SpO2:  [96 %] 96 % (09/12 0609)  EXAM: Patient is a well-developed well-nourished white female in no acute distress.   Lungs are clear to auscultation , the patient has symmetrical respiratory excursion. Heart has a regular rate and rhythm normal S1 and S2 no murmur.   Abdomen is soft nontender nondistended bowel sounds are present. Extremity examination shows no clubbing cyanosis or edema. Motor examination shows 5 over 5 strength in the upper extremities including the deltoid biceps triceps and intrinsics and grip. Sensation is intact to pinprick throughout the digits of the upper extremities. Reflexes are symmetrical and without evidence of pathologic reflexes. Patient has a normal gait and stance.   Data Review:CBC    Component Value Date/Time   WBC 5.3 03/15/2018 1419   RBC 4.41 03/15/2018 1419   HGB 13.5 03/15/2018 1419   HCT  42.4 03/15/2018 1419   PLT 209 03/15/2018 1419   MCV 96.1 03/15/2018 1419   MCH 30.6 03/15/2018 1419   MCHC 31.8 03/15/2018 1419   RDW 12.4 03/15/2018 1419                          BMET    Component Value Date/Time   NA 139 03/15/2018 1419   NA 138 10/09/2015   K 4.5 03/15/2018 1419   CL 103 03/15/2018 1419   CO2 25 03/15/2018 1419   GLUCOSE 101 (H) 03/15/2018 1419   BUN 12 03/15/2018 1419   BUN 16 10/09/2015   CREATININE 0.78 03/15/2018 1419   CALCIUM 9.6 03/15/2018  1419   GFRNONAA >60 03/15/2018 1419   GFRAA >60 03/15/2018 1419     Assessment/Plan: Patient admitted for 3 level ACDF for multilevel cervical spondylosis and degenerative disc disease with resulting cervical canal stenosis.  Notably she has no alteration of cord signal.  I've discussed with the patient the nature of his condition, the nature the surgical procedure, the typical length of surgery, hospital stay, and overall recuperation. We discussed limitations postoperatively. I discussed risks of surgery including risks of infection, bleeding, possibly need for transfusion, the risk of nerve root dysfunction with pain, weakness, numbness, or paresthesias, the risk of spinal cord dysfunction with paralysis of all 4 limbs and quadriplegia, and the risk of dural tear and CSF leakage and possible need for further surgery, the risk of esophageal dysfunction causing dysphagia and the risk of laryngeal dysfunction causing hoarseness of the voice, the risk of failure of the arthrodesis and the possible need for further surgery, and the risk of anesthetic complications including myocardial infarction, stroke, pneumonia, and death. We also discussed the need for postoperative immobilization in a cervical collar. Understanding all this the patient does wish to proceed with surgery and is admitted for such.    Hewitt Shorts, MD 03/22/2018 7:13 AM

## 2018-03-23 ENCOUNTER — Encounter (HOSPITAL_COMMUNITY): Payer: Self-pay | Admitting: Neurosurgery

## 2018-03-23 MED ORDER — HYDROCODONE-ACETAMINOPHEN 5-325 MG PO TABS: 0.5000 | ORAL_TABLET | ORAL | 0 refills | Status: DC | PRN

## 2018-03-23 MED FILL — Thrombin (Recombinant) For Soln 20000 Unit: CUTANEOUS | Qty: 1 | Status: AC

## 2018-03-23 NOTE — Progress Notes (Signed)
Patient alert and oriented, mae's well, voiding adequate amount of urine, swallowing without difficulty, no c/o pain at time of discharge. Patient discharged home with transportation services. Script and discharged instructions given to patient. Patient and stated understanding of instructions given. Patient has an appointment with Dr. Newell CoralNudelman

## 2018-03-23 NOTE — Anesthesia Postprocedure Evaluation (Signed)
Anesthesia Post Note  Patient: Brittany PainBarbara [name] [name]  Procedure([name]) Performed: ANTERIOR CERVICAL DECOMPRESSION/DISCECTOMY FUSION CERVICAL FOUR - CERVICAL FIVE, CERVICAL FIVE - CERVICAL SIX, CERVICAL SIX- CERVICAL SEVEN (N/A Spine Cervical)     Patient location during evaluation: PACU Anesthesia Type: General Level of consciousness: awake and alert [name] management: [name] level controlled Vital Signs Assessment: post-procedure vital signs reviewed and stable Respiratory status: spontaneous breathing, nonlabored ventilation, respiratory function stable and patient connected to nasal cannula oxygen Cardiovascular status: blood pressure returned to baseline and stable Postop Assessment: no apparent nausea or vomiting Anesthetic complications: no    Last Vitals:  Vitals:   03/23/18 0446 03/23/18 0745  BP: (!) 112/59 (!) 122/97  Pulse: 77 85  Resp: 18 16  Temp: 37.1 C 37.6 C  SpO2: 95% 100%    Last [name]:  Vitals:   03/23/18 0745  TempSrc: Oral  PainSc:                  Brittany [name]

## 2018-03-23 NOTE — Discharge Instructions (Signed)
Wound Care Leave incision open to air. You may shower. Do not scrub directly on incision.  Do not put any creams, lotions, or ointments on incision. Activity Walk each and every day, increasing distance each day. No lifting greater than 5 lbs.  Avoid excessive neck motion. No driving for 2 weeks; may ride as a passenger locally. Wear neck brace at all times except when showering.  If provided soft collar, may wear for comfort unless otherwise instructed. Redness, drainage, or swelling at the wound.  Temperature greater than 101 degrees. Severe pain not relieved by pain medication. Increased difficulty swallowing. Incision starts to come apart. Follow Up Appt Call today for appointment in 3 weeks [phone] or for problems.  If you have any hardware placed in your spine, you will need an x-ray before your appointment.

## 2018-03-23 NOTE — Discharge Summary (Signed)
Physician Discharge Summary  Patient ID: Brittany [name] MRN: [id number] DOB/AGE: October 24, 1945 72 y.o.  Admit date: 03/22/2018 Discharge date: 03/23/2018  Admission Diagnoses:  Cervical HNP, cervical stenosis, cervical spondylosis, cervical degenerative disc disease, cervicalgia, cervical radiculopathy  Discharge Diagnoses:  Cervical HNP, cervical stenosis, cervical spondylosis, cervical degenerative disc disease, cervicalgia, cervical radiculopathy Active Problems:   HNP (herniated nucleus pulposus), cervical   Discharged Condition: good  Hospital Course: Patient was admitted, underwent a 3 level C4-5, C5-6, and C6-7 ACDF.  She is done well following surgery.  She is up and ambulating actively.  Her incision is healing nicely.  She is being discharged home with instructions regarding wound care and activities.  She is scheduled follow-up with me in the office in 3 weeks.  Discharge Exam: Blood pressure (!) 122/97, pulse 85, temperature 99.6 F (37.6 C), temperature source Oral, resp. rate 16, height 5\' 3"  (1.6 m), SpO2 100 %.  Disposition: Discharge disposition: 01-Home or Self Care       Discharge Instructions    Discharge wound care:   Complete by:  As directed    Leave the wound open to air. Shower daily with the wound uncovered. Water and soapy water should run over the incision area. Do not wash directly on the incision for 2 weeks. Remove the glue after 2 weeks.   Driving Restrictions   Complete by:  As directed    No driving for 2 weeks. May ride in the car locally now. May begin to drive locally in 2 weeks.   Other Restrictions   Complete by:  As directed    Walk gradually increasing distances out in the fresh air at least twice a day. Walking additional 6 times inside the house, gradually increasing distances, daily. No bending, lifting, or twisting. Perform activities between shoulder and waist height (that is at counter height when standing or table height when  sitting).     Allergies as of 03/23/2018      Reactions   Tetracyclines & Related Other (See Comments)   Throat ulcer   Penicillins    UNSPECIFIED REACTION  Has patient had a PCN reaction causing immediate rash, facial/tongue/throat swelling, SOB or lightheadedness with hypotension: No Has patient had a PCN reaction causing severe rash involving mucus membranes or skin necrosis: NO Has patient had a PCN reaction that required hospitalization No Has patient had a PCN reaction occurring within the last 10 years: No If all of the above answers are "NO", then may proceed with Cephalosporin       Medication List    STOP taking these medications   gabapentin 300 MG capsule Commonly known as:  NEURONTIN   oxyCODONE 5 MG immediate release tablet Commonly known as:  Oxy IR/ROXICODONE     TAKE these medications   acetaminophen 500 MG tablet Commonly known as:  TYLENOL Take 500 mg by mouth every 6 (six) hours as needed (for pain.).   alendronate 70 MG tablet Commonly known as:  FOSAMAX Take 70 mg by mouth once a week. Take with a full glass of water on an empty stomach.   calcium carbonate 750 MG chewable tablet Commonly known as:  TUMS EX Chew 1 tablet by mouth every evening.   clonazePAM 1 MG tablet Commonly known as:  KLONOPIN Take 0.5-1 mg by mouth 2 (two) times daily.   docusate sodium 250 MG capsule Commonly known as:  COLACE Take 250 mg by mouth every evening.   Fish Oil 1200 MG Caps Take  1,200 mg by mouth every evening.   FLUoxetine 40 MG capsule Commonly known as:  PROZAC Take 40 mg by mouth daily.   HYDROcodone-acetaminophen 5-325 MG tablet Commonly known as:  NORCO/VICODIN Take 0.5-1 tablets by mouth every 4 (four) hours as needed (pain).   loratadine 10 MG tablet Commonly known as:  CLARITIN Take 10 mg by mouth every evening.   naproxen sodium 220 MG tablet Commonly known as:  ALEVE Take 220 mg by mouth 2 (two) times daily.   NON FORMULARY Take 1  tablet by mouth daily. AgmaSet 890mg    polyethylene glycol packet Commonly known as:  MIRALAX / GLYCOLAX Take 17 g by mouth every other day.   polyethylene glycol powder powder Commonly known as:  GLYCOLAX/MIRALAX Take 1 Container by mouth daily.   simvastatin 40 MG tablet Commonly known as:  ZOCOR Take 40 mg by mouth every evening.   tiZANidine 4 MG tablet Commonly known as:  ZANAFLEX Take 4 mg by mouth 2 (two) times daily.   Vitamin D-3 1000 units Caps Take 2,000 Units by mouth every evening.            Discharge Care Instructions  (From admission, onward)         Start     Ordered   03/23/18 0000  Discharge wound care:    Comments:  Leave the wound open to air. Shower daily with the wound uncovered. Water and soapy water should run over the incision area. Do not wash directly on the incision for 2 weeks. Remove the glue after 2 weeks.   03/23/18 0836           Signed: Hewitt ShortsNUDELMAN,ROBERT W 03/23/2018, 8:36 AM

## 2018-10-11 ENCOUNTER — Other Ambulatory Visit: Payer: Self-pay | Admitting: Radiology

## 2018-10-11 ENCOUNTER — Other Ambulatory Visit: Payer: Self-pay | Admitting: Family Medicine

## 2018-10-11 DIAGNOSIS — R911 Solitary pulmonary nodule: Secondary | ICD-10-CM

## 2018-11-29 ENCOUNTER — Other Ambulatory Visit: Payer: Medicare Other

## 2019-01-14 ENCOUNTER — Other Ambulatory Visit: Payer: Self-pay

## 2019-01-14 ENCOUNTER — Ambulatory Visit
Admission: RE | Admit: 2019-01-14 | Discharge: 2019-01-14 | Disposition: A | Discharge status: Home / Self Care | Payer: Medicare Other | Source: Ambulatory Visit | Attending: Family Medicine | Admitting: Family Medicine

## 2019-01-14 DIAGNOSIS — R911 Solitary pulmonary nodule: Secondary | ICD-10-CM

## 2019-08-19 ENCOUNTER — Ambulatory Visit: Payer: Medicare Other | Attending: Internal Medicine

## 2019-08-19 DIAGNOSIS — Z23 Encounter for immunization: Secondary | ICD-10-CM

## 2019-08-19 NOTE — Progress Notes (Signed)
   Covid-19 Vaccination Clinic  Name:  Brittany [name]    MRN: [id number] DOB: 14-Mar-1946  08/19/2019  [name] was observed post Covid-19 immunization for 15 minutes without incidence. She was provided with Vaccine Information Sheet and instruction to access the V-Safe system.   [name] was instructed to call 911 with any severe reactions post vaccine: Marland Kitchen Difficulty breathing  . Swelling of your face and throat  . A fast heartbeat  . A bad rash all over your body  . Dizziness and weakness    Immunizations Administered    Name Date Dose VIS Date Route   Pfizer COVID-19 Vaccine 08/19/2019  3:01 PM 0.3 mL 06/21/2019 Intramuscular   Manufacturer: [employer], [employer]   Lot: [id number]   NDC: [address]-3

## 2019-09-13 ENCOUNTER — Ambulatory Visit: Payer: Medicare Other | Attending: Internal Medicine

## 2019-09-13 DIAGNOSIS — Z23 Encounter for immunization: Secondary | ICD-10-CM | POA: Insufficient documentation

## 2019-09-13 NOTE — Progress Notes (Signed)
   Covid-19 Vaccination Clinic  Name:  Brittany [name]    MRN: [id number] DOB: [birth year]  09/13/2019  Brittany [name] was observed post Covid-19 immunization for 15 minutes without incident. She was provided with Vaccine Information Sheet and instruction to access the V-Safe system.   Brittany [name] was instructed to call 911 with any severe reactions post vaccine: Marland Kitchen Difficulty breathing  . Swelling of face and throat  . A fast heartbeat  . A bad rash all over body  . Dizziness and weakness   Immunizations Administered    Name Date Dose VIS Date Route   Pfizer COVID-19 Vaccine 09/13/2019 11:15 AM 0.3 mL 06/21/2019 Intramuscular   Manufacturer: [employer], [employer]   Lot: [id number]   NDC: [address]-7

## 2019-10-25 ENCOUNTER — Other Ambulatory Visit: Payer: Self-pay | Admitting: Family Medicine

## 2019-10-25 ENCOUNTER — Ambulatory Visit
Admission: RE | Admit: 2019-10-25 | Discharge: 2019-10-25 | Disposition: A | Discharge status: Home / Self Care | Payer: Medicare Other | Source: Ambulatory Visit | Attending: Family Medicine | Admitting: Family Medicine

## 2019-10-25 DIAGNOSIS — M545 Low back pain, unspecified: Secondary | ICD-10-CM

## 2020-02-26 ENCOUNTER — Other Ambulatory Visit: Payer: Self-pay | Admitting: Neurological Surgery

## 2020-03-02 NOTE — Progress Notes (Signed)
[id number] - [place], [place] - [address] AT [facility] [address] [place] [address] Phone: [phone] Fax: [phone]  [id number] [place], [place] - [address] AT [facility] [address] [facility] [address] [place] [address] Phone: [phone] Fax: [phone]      Your procedure is scheduled on Friday 03/06/2020.  Report to [facility] Main Entrance "A" at 09:50 A.M., and check in at the Admitting office.  Call this number if you have problems the morning of surgery:  [phone]  Call [phone] if you have any questions prior to your surgery date Monday-Friday 8am-4pm    Remember:  Do not eat or drink after midnight the night before your surgery    Take these medicines the morning of surgery with A SIP OF WATER: Acetaminophen (Tylenol) - if needed Clonazepam (Klonopin) Fluoxetine (Prozac) Loratadine (Claritin) Polyethyl Glycol-Propyl Glycol (Lubricant eye drops) Tizanidine (Zanaflex)   As of today, STOP taking any Aspirin (unless otherwise instructed by your surgeon) Aleve, Naproxen, Ibuprofen, Motrin, Advil, Goody's, BC's, all herbal medications, fish oil, and all vitamins.                      Do not wear jewelry, make up, or nail polish            Do not wear lotions, powders, perfumes, or deodorant.            Do not shave 48 hours prior to surgery.             Do not bring valuables to the hospital.            [facility] is not responsible for any belongings or valuables.  Do NOT Smoke (Tobacco/Vaping) or drink Alcohol 24 hours prior to your procedure  If you use a CPAP at night, you may bring all equipment for your overnight stay.   Contacts, glasses, dentures or bridgework may not be worn into surgery.      For patients admitted to the hospital, discharge time will be determined by your treatment team.   Patients discharged the day of surgery  will not be allowed to drive home, and someone needs to stay with them for 24 hours.    Special instructions:   - Preparing For Surgery  Before surgery, you can play an important role. Because skin is not sterile, your skin needs to be as free of germs as possible. You can reduce the number of germs on your skin by washing with CHG (chlorahexidine gluconate) Soap before surgery.  CHG is an antiseptic cleaner which kills germs and bonds with the skin to continue killing germs even after washing.    Oral Hygiene is also important to reduce your risk of infection.  Remember - BRUSH YOUR TEETH THE MORNING OF SURGERY WITH YOUR REGULAR TOOTHPASTE  Please do not use if you have an allergy to CHG or antibacterial soaps. If your skin becomes reddened/irritated stop using the CHG.  Do not shave (including legs and underarms) for at least 48 hours prior to first CHG shower. It is OK to shave your face.  Please follow these instructions carefully.   1. Shower the NIGHT BEFORE SURGERY and the MORNING OF SURGERY with CHG Soap.   2. If you chose to wash your hair, wash your hair first as usual with your normal shampoo.  3. After you shampoo, rinse your hair and  body thoroughly to remove the shampoo.  4. Use CHG as you would any other liquid soap. You can apply CHG directly to the skin and wash gently with a scrungie or a clean washcloth.   5. Apply the CHG Soap to your body ONLY FROM THE NECK DOWN.  Do not use on open wounds or open sores. Avoid contact with your eyes, ears, mouth and genitals (private parts). Wash Face and genitals (private parts)  with your normal soap.   6. Wash thoroughly, paying special attention to the area where your surgery will be performed.  7. Thoroughly rinse your body with warm water from the neck down.  8. DO NOT shower/wash with your normal soap after using and rinsing off the CHG Soap.  9. Pat yourself dry with a CLEAN TOWEL.  10. Wear CLEAN PAJAMAS to  bed the night before surgery  11. Place CLEAN SHEETS on your bed the night of your first shower and DO NOT SLEEP WITH PETS.   Day of Surgery: Shower with CHG soap as directed Wear Clean/Comfortable clothing the morning of surgery Do not apply any deodorants/lotions.   Remember to brush your teeth WITH YOUR REGULAR TOOTHPASTE.   Please read over the following fact sheets that you were given.

## 2020-03-03 ENCOUNTER — Ambulatory Visit (HOSPITAL_COMMUNITY)
Admission: RE | Admit: 2020-03-03 | Discharge: 2020-03-03 | Disposition: A | Discharge status: Home / Self Care | Payer: Medicare Other | Source: Ambulatory Visit | Attending: Neurological Surgery | Admitting: Neurological Surgery

## 2020-03-03 ENCOUNTER — Encounter (HOSPITAL_COMMUNITY): Payer: Self-pay

## 2020-03-03 ENCOUNTER — Other Ambulatory Visit: Payer: Self-pay

## 2020-03-03 ENCOUNTER — Encounter (HOSPITAL_COMMUNITY)
Admission: RE | Admit: 2020-03-03 | Discharge: 2020-03-03 | Disposition: A | Discharge status: Home / Self Care | Payer: Medicare Other | Source: Ambulatory Visit | Attending: Neurological Surgery | Admitting: Neurological Surgery

## 2020-03-03 ENCOUNTER — Other Ambulatory Visit (HOSPITAL_COMMUNITY)
Admission: RE | Admit: 2020-03-03 | Discharge: 2020-03-03 | Disposition: A | Discharge status: Home / Self Care | Payer: Medicare Other | Source: Ambulatory Visit | Attending: Neurological Surgery | Admitting: Neurological Surgery

## 2020-03-03 DIAGNOSIS — M48061 Spinal stenosis, lumbar region without neurogenic claudication: Secondary | ICD-10-CM

## 2020-03-03 DIAGNOSIS — Z01818 Encounter for other preprocedural examination: Secondary | ICD-10-CM | POA: Insufficient documentation

## 2020-03-03 DIAGNOSIS — Z20822 Contact with and (suspected) exposure to covid-19: Secondary | ICD-10-CM | POA: Insufficient documentation

## 2020-03-03 DIAGNOSIS — Z01812 Encounter for preprocedural laboratory examination: Secondary | ICD-10-CM | POA: Diagnosis present

## 2020-03-03 HISTORY — DX: Obsessive-compulsive disorder, unspecified: F42.9

## 2020-03-03 LAB — CBC WITH DIFFERENTIAL/PLATELET
Abs Immature Granulocytes: 0.01 10*3/uL (ref 0.00–0.07)
Basophils Absolute: 0 10*3/uL (ref 0.0–0.1)
Basophils Relative: 1 %
Eosinophils Absolute: 0.1 10*3/uL (ref 0.0–0.5)
Eosinophils Relative: 2 %
HCT: 42.7 % (ref 36.0–46.0)
Hemoglobin: 13.4 g/dL (ref 12.0–15.0)
Immature Granulocytes: 0 %
Lymphocytes Relative: 32 %
Lymphs Abs: 1.8 10*3/uL (ref 0.7–4.0)
MCH: 30.5 pg (ref 26.0–34.0)
MCHC: 31.4 g/dL (ref 30.0–36.0)
MCV: 97.3 fL (ref 80.0–100.0)
Monocytes Absolute: 0.6 10*3/uL (ref 0.1–1.0)
Monocytes Relative: 11 %
Neutro Abs: 3 10*3/uL (ref 1.7–7.7)
Neutrophils Relative %: 54 %
Platelets: 194 10*3/uL (ref 150–400)
RBC: 4.39 MIL/uL (ref 3.87–5.11)
RDW: 12.6 % (ref 11.5–15.5)
WBC: 5.5 10*3/uL (ref 4.0–10.5)
nRBC: 0 % (ref 0.0–0.2)

## 2020-03-03 LAB — BASIC METABOLIC PANEL
Anion gap: 10 (ref 5–15)
BUN: 14 mg/dL (ref 8–23)
CO2: 25 mmol/L (ref 22–32)
Calcium: 9.7 mg/dL (ref 8.9–10.3)
Chloride: 104 mmol/L (ref 98–111)
Creatinine, Ser: 0.85 mg/dL (ref 0.44–1.00)
GFR calc Af Amer: >60 mL/min (ref >60)
GFR calc non Af Amer: >60 mL/min (ref >60)
Glucose, Bld: 84 mg/dL (ref 70–99)
Potassium: 3.6 mmol/L (ref 3.5–5.1)
Sodium: 139 mmol/L (ref 135–145)

## 2020-03-03 LAB — PROTIME-INR
INR: 1 (ref 0.8–1.2)
Prothrombin Time: 12.5 seconds (ref 11.4–15.2)

## 2020-03-03 LAB — TYPE AND SCREEN
ABO/RH(D): O POS
Antibody Screen: NEGATIVE

## 2020-03-03 LAB — SURGICAL PCR SCREEN
MRSA, PCR: NEGATIVE
Staphylococcus aureus: NEGATIVE

## 2020-03-03 LAB — SARS CORONAVIRUS 2 (TAT 6-24 HRS): SARS Coronavirus 2: NEGATIVE

## 2020-03-03 NOTE — Progress Notes (Signed)
PCP - Lupita Raider @ [facility] Cardiologist - na   Chest x-ray - 03/03/20 EKG - 03/03/20 Stress Test - na ECHO - na Cardiac Cath - na  Sleep Study - na   Fasting Blood Sugar - na   Blood Thinner Instructions:na Aspirin Instructions: na  ERAS Protcol -na   COVID TEST-  03/03/20   Anesthesia review:   Patient denies shortness of breath, fever, cough and chest pain at PAT appointment   All instructions explained to the patient, with a verbal understanding of the material. Patient agrees to go over the instructions while at home for a better understanding. Patient also instructed to self quarantine after being tested for COVID-19. The opportunity to ask questions was provided.

## 2020-03-06 ENCOUNTER — Encounter (HOSPITAL_COMMUNITY)
Admission: RE | Disposition: A | Discharge status: Skilled Nursing Facility | Payer: Self-pay | Source: Home / Self Care | Attending: Neurological Surgery

## 2020-03-06 ENCOUNTER — Ambulatory Visit (HOSPITAL_COMMUNITY): Payer: Medicare Other

## 2020-03-06 ENCOUNTER — Ambulatory Visit (HOSPITAL_COMMUNITY): Payer: Medicare Other | Admitting: Certified Registered"

## 2020-03-06 ENCOUNTER — Inpatient Hospital Stay (HOSPITAL_COMMUNITY)
Admission: RE | Admit: 2020-03-06 | Discharge: 2020-03-09 | DRG: 455 | Disposition: A | Discharge status: Skilled Nursing Facility | Payer: Medicare Other | Attending: Neurological Surgery | Admitting: Neurological Surgery

## 2020-03-06 ENCOUNTER — Other Ambulatory Visit: Payer: Self-pay

## 2020-03-06 ENCOUNTER — Encounter (HOSPITAL_COMMUNITY): Payer: Self-pay | Admitting: Neurological Surgery

## 2020-03-06 DIAGNOSIS — Z9841 Cataract extraction status, right eye: Secondary | ICD-10-CM

## 2020-03-06 DIAGNOSIS — M48061 Spinal stenosis, lumbar region without neurogenic claudication: Principal | ICD-10-CM | POA: Diagnosis present

## 2020-03-06 DIAGNOSIS — Z9842 Cataract extraction status, left eye: Secondary | ICD-10-CM

## 2020-03-06 DIAGNOSIS — Z20822 Contact with and (suspected) exposure to covid-19: Secondary | ICD-10-CM | POA: Diagnosis present

## 2020-03-06 DIAGNOSIS — E785 Hyperlipidemia, unspecified: Secondary | ICD-10-CM | POA: Diagnosis present

## 2020-03-06 DIAGNOSIS — E7800 Pure hypercholesterolemia, unspecified: Secondary | ICD-10-CM | POA: Diagnosis present

## 2020-03-06 DIAGNOSIS — M5126 Other intervertebral disc displacement, lumbar region: Secondary | ICD-10-CM | POA: Diagnosis present

## 2020-03-06 DIAGNOSIS — Z9049 Acquired absence of other specified parts of digestive tract: Secondary | ICD-10-CM

## 2020-03-06 DIAGNOSIS — Z79899 Other long term (current) drug therapy: Secondary | ICD-10-CM

## 2020-03-06 DIAGNOSIS — I10 Essential (primary) hypertension: Secondary | ICD-10-CM | POA: Diagnosis present

## 2020-03-06 DIAGNOSIS — Z880 Allergy status to penicillin: Secondary | ICD-10-CM

## 2020-03-06 DIAGNOSIS — Z7983 Long term (current) use of bisphosphonates: Secondary | ICD-10-CM

## 2020-03-06 DIAGNOSIS — M4310 Spondylolisthesis, site unspecified: Secondary | ICD-10-CM | POA: Diagnosis present

## 2020-03-06 DIAGNOSIS — J45909 Unspecified asthma, uncomplicated: Secondary | ICD-10-CM | POA: Diagnosis present

## 2020-03-06 DIAGNOSIS — M810 Age-related osteoporosis without current pathological fracture: Secondary | ICD-10-CM | POA: Diagnosis present

## 2020-03-06 DIAGNOSIS — Z981 Arthrodesis status: Secondary | ICD-10-CM

## 2020-03-06 DIAGNOSIS — Z791 Long term (current) use of non-steroidal anti-inflammatories (NSAID): Secondary | ICD-10-CM

## 2020-03-06 DIAGNOSIS — Z419 Encounter for procedure for purposes other than remedying health state, unspecified: Secondary | ICD-10-CM

## 2020-03-06 DIAGNOSIS — M4186 Other forms of scoliosis, lumbar region: Secondary | ICD-10-CM | POA: Diagnosis present

## 2020-03-06 DIAGNOSIS — Z881 Allergy status to other antibiotic agents status: Secondary | ICD-10-CM

## 2020-03-06 DIAGNOSIS — M79606 Pain in leg, unspecified: Secondary | ICD-10-CM | POA: Diagnosis present

## 2020-03-06 DIAGNOSIS — M549 Dorsalgia, unspecified: Secondary | ICD-10-CM | POA: Diagnosis present

## 2020-03-06 DIAGNOSIS — M1990 Unspecified osteoarthritis, unspecified site: Secondary | ICD-10-CM | POA: Diagnosis present

## 2020-03-06 LAB — GLUCOSE, CAPILLARY: Glucose-Capillary: 207 mg/dL — ABNORMAL HIGH (ref 70–99)

## 2020-03-06 SURGERY — POSTERIOR LUMBAR FUSION 1 LEVEL
Anesthesia: General | Site: Spine Lumbar

## 2020-03-06 MED ORDER — POLYETHYL GLYCOL-PROPYL GLYCOL 0.4-0.3 % OP SOLN: 1.0000 [drp] | Freq: Every day | OPHTHALMIC | Status: DC

## 2020-03-06 MED ORDER — ACETAMINOPHEN 325 MG PO TABS
650.0000 mg | ORAL_TABLET | ORAL | Status: DC | PRN
  Administered 2020-03-07 (×2): 650 mg via ORAL
  Filled 2020-03-06 (×3): qty 2

## 2020-03-06 MED ORDER — VANCOMYCIN HCL IN DEXTROSE 1-5 GM/200ML-% IV SOLN: 1000.0000 mg | INTRAVENOUS | Status: AC

## 2020-03-06 MED ORDER — PROPOFOL 10 MG/ML IV BOLUS
INTRAVENOUS | Status: DC | PRN
  Administered 2020-03-06: 100 mg via INTRAVENOUS

## 2020-03-06 MED ORDER — DEXAMETHASONE SODIUM PHOSPHATE 10 MG/ML IJ SOLN
10.0000 mg | Freq: Once | INTRAMUSCULAR | Status: AC
  Administered 2020-03-06: 10 mg via INTRAVENOUS

## 2020-03-06 MED ORDER — SUGAMMADEX SODIUM 200 MG/2ML IV SOLN
INTRAVENOUS | Status: DC | PRN
  Administered 2020-03-06: 200 mg via INTRAVENOUS

## 2020-03-06 MED ORDER — THROMBIN 5000 UNITS EX SOLR: OROMUCOSAL | Status: DC | PRN

## 2020-03-06 MED ORDER — CHLORHEXIDINE GLUCONATE 0.12 % MT SOLN: 15.0000 mL | Freq: Once | OROMUCOSAL | Status: AC

## 2020-03-06 MED ORDER — ONDANSETRON HCL 4 MG/2ML IJ SOLN
INTRAMUSCULAR | Status: DC | PRN
  Administered 2020-03-06: 4 mg via INTRAVENOUS

## 2020-03-06 MED ORDER — PHENYLEPHRINE HCL-NACL 10-0.9 MG/250ML-% IV SOLN
INTRAVENOUS | Status: DC | PRN
  Administered 2020-03-06: 20 ug/min via INTRAVENOUS

## 2020-03-06 MED ORDER — PHENOL 1.4 % MT LIQD: 1.0000 | OROMUCOSAL | Status: DC | PRN

## 2020-03-06 MED ORDER — CHLORHEXIDINE GLUCONATE CLOTH 2 % EX PADS: 6.0000 | MEDICATED_PAD | Freq: Once | CUTANEOUS | Status: DC

## 2020-03-06 MED ORDER — METHOCARBAMOL 500 MG PO TABS
500.0000 mg | ORAL_TABLET | Freq: Four times a day (QID) | ORAL | Status: DC | PRN
  Administered 2020-03-06 – 2020-03-08 (×5): 500 mg via ORAL
  Filled 2020-03-06 (×4): qty 1

## 2020-03-06 MED ORDER — LIDOCAINE 2% (20 MG/ML) 5 ML SYRINGE
INTRAMUSCULAR | Status: DC | PRN
  Administered 2020-03-06: 100 mg via INTRAVENOUS

## 2020-03-06 MED ORDER — FENTANYL CITRATE (PF) 250 MCG/5ML IJ SOLN
INTRAMUSCULAR | Status: AC
  Filled 2020-03-06: qty 5

## 2020-03-06 MED ORDER — ALBUMIN HUMAN 5 % IV SOLN
12.5000 g | Freq: Once | INTRAVENOUS | Status: AC
  Administered 2020-03-06: 12.5 g via INTRAVENOUS

## 2020-03-06 MED ORDER — ALBUMIN HUMAN 5 % IV SOLN
INTRAVENOUS | Status: AC
  Filled 2020-03-06: qty 250

## 2020-03-06 MED ORDER — ACETAMINOPHEN 500 MG PO TABS: 1000.0000 mg | ORAL_TABLET | Freq: Once | ORAL | Status: AC

## 2020-03-06 MED ORDER — GABAPENTIN 300 MG PO CAPS: 300.0000 mg | ORAL_CAPSULE | Freq: Once | ORAL | Status: AC

## 2020-03-06 MED ORDER — SODIUM CHLORIDE 0.9 % IV SOLN: 250.0000 mL | INTRAVENOUS | Status: DC

## 2020-03-06 MED ORDER — MEPERIDINE HCL 25 MG/ML IJ SOLN: 6.2500 mg | INTRAMUSCULAR | Status: DC | PRN

## 2020-03-06 MED ORDER — ONDANSETRON HCL 4 MG/2ML IJ SOLN
4.0000 mg | Freq: Once | INTRAMUSCULAR | Status: AC | PRN
  Administered 2020-03-06: 4 mg via INTRAVENOUS

## 2020-03-06 MED ORDER — HEPARIN SODIUM (PORCINE) 1000 UNIT/ML IJ SOLN
INTRAMUSCULAR | Status: AC
  Filled 2020-03-06: qty 1

## 2020-03-06 MED ORDER — POLYVINYL ALCOHOL 1.4 % OP SOLN
1.0000 [drp] | Freq: Every day | OPHTHALMIC | Status: DC
  Filled 2020-03-06: qty 15

## 2020-03-06 MED ORDER — OXYCODONE HCL 5 MG PO TABS
5.0000 mg | ORAL_TABLET | ORAL | Status: DC | PRN
  Administered 2020-03-06 – 2020-03-09 (×6): 5 mg via ORAL
  Filled 2020-03-06 (×7): qty 1

## 2020-03-06 MED ORDER — SODIUM CHLORIDE 0.9% FLUSH
3.0000 mL | Freq: Two times a day (BID) | INTRAVENOUS | Status: DC
  Administered 2020-03-06 – 2020-03-08 (×5): 3 mL via INTRAVENOUS

## 2020-03-06 MED ORDER — HYDROMORPHONE HCL 1 MG/ML IJ SOLN
INTRAMUSCULAR | Status: AC
Start: 2020-03-06 — End: 2020-03-07
  Filled 2020-03-06: qty 1

## 2020-03-06 MED ORDER — VANCOMYCIN HCL IN DEXTROSE 1-5 GM/200ML-% IV SOLN
1000.0000 mg | Freq: Once | INTRAVENOUS | Status: AC
  Administered 2020-03-06: 1000 mg via INTRAVENOUS
  Filled 2020-03-06: qty 200

## 2020-03-06 MED ORDER — PROPOFOL 10 MG/ML IV BOLUS
INTRAVENOUS | Status: AC
  Filled 2020-03-06: qty 20

## 2020-03-06 MED ORDER — METHOCARBAMOL 500 MG PO TABS
ORAL_TABLET | ORAL | Status: AC
  Filled 2020-03-06: qty 1

## 2020-03-06 MED ORDER — ORAL CARE MOUTH RINSE: 15.0000 mL | Freq: Once | OROMUCOSAL | Status: AC

## 2020-03-06 MED ORDER — CELECOXIB 200 MG PO CAPS
200.0000 mg | ORAL_CAPSULE | Freq: Two times a day (BID) | ORAL | Status: DC
  Administered 2020-03-06 – 2020-03-09 (×6): 200 mg via ORAL
  Filled 2020-03-06 (×6): qty 1

## 2020-03-06 MED ORDER — THROMBIN 20000 UNITS EX SOLR
CUTANEOUS | Status: AC
  Filled 2020-03-06: qty 20000

## 2020-03-06 MED ORDER — ARTHREX ANGEL - ACD-A SOLUTION (CHARTING ONLY) OPTIME
TOPICAL | Status: DC | PRN
  Administered 2020-03-06: 10 mL via TOPICAL

## 2020-03-06 MED ORDER — LACTATED RINGERS IV SOLN: INTRAVENOUS | Status: DC

## 2020-03-06 MED ORDER — ACETAMINOPHEN 650 MG RE SUPP: 650.0000 mg | RECTAL | Status: DC | PRN

## 2020-03-06 MED ORDER — BUPIVACAINE HCL (PF) 0.25 % IJ SOLN
INTRAMUSCULAR | Status: AC
  Filled 2020-03-06: qty 30

## 2020-03-06 MED ORDER — HEPARIN SODIUM (PORCINE) 1000 UNIT/ML IJ SOLN
INTRAMUSCULAR | Status: DC | PRN
  Administered 2020-03-06: 10000 [IU]

## 2020-03-06 MED ORDER — SENNA 8.6 MG PO TABS
1.0000 | ORAL_TABLET | Freq: Two times a day (BID) | ORAL | Status: DC
  Administered 2020-03-06 – 2020-03-09 (×6): 8.6 mg via ORAL
  Filled 2020-03-06 (×6): qty 1

## 2020-03-06 MED ORDER — ONDANSETRON HCL 4 MG/2ML IJ SOLN
INTRAMUSCULAR | Status: AC
  Filled 2020-03-06: qty 2

## 2020-03-06 MED ORDER — ACETAMINOPHEN 500 MG PO TABS
ORAL_TABLET | ORAL | Status: AC
  Administered 2020-03-06: 1000 mg via ORAL
  Filled 2020-03-06: qty 2

## 2020-03-06 MED ORDER — VANCOMYCIN HCL IN DEXTROSE 1-5 GM/200ML-% IV SOLN
INTRAVENOUS | Status: AC
  Administered 2020-03-06: 1000 mg via INTRAVENOUS
  Filled 2020-03-06: qty 200

## 2020-03-06 MED ORDER — MENTHOL 3 MG MT LOZG: 1.0000 | LOZENGE | OROMUCOSAL | Status: DC | PRN

## 2020-03-06 MED ORDER — DOCUSATE SODIUM 100 MG PO CAPS
200.0000 mg | ORAL_CAPSULE | Freq: Every day | ORAL | Status: DC
  Administered 2020-03-07 – 2020-03-09 (×3): 200 mg via ORAL
  Filled 2020-03-06 (×3): qty 2

## 2020-03-06 MED ORDER — HYDROMORPHONE HCL 1 MG/ML IJ SOLN
0.2500 mg | INTRAMUSCULAR | Status: DC | PRN
  Administered 2020-03-06: 0.25 mg via INTRAVENOUS

## 2020-03-06 MED ORDER — ROCURONIUM BROMIDE 10 MG/ML (PF) SYRINGE
PREFILLED_SYRINGE | INTRAVENOUS | Status: DC | PRN
  Administered 2020-03-06: 50 mg via INTRAVENOUS

## 2020-03-06 MED ORDER — THROMBIN 20000 UNITS EX SOLR: CUTANEOUS | Status: DC | PRN

## 2020-03-06 MED ORDER — METHOCARBAMOL 1000 MG/10ML IJ SOLN
500.0000 mg | Freq: Four times a day (QID) | INTRAVENOUS | Status: DC | PRN
  Filled 2020-03-06: qty 5

## 2020-03-06 MED ORDER — ONDANSETRON HCL 4 MG/2ML IJ SOLN: 4.0000 mg | Freq: Four times a day (QID) | INTRAMUSCULAR | Status: DC | PRN

## 2020-03-06 MED ORDER — CLONAZEPAM 0.5 MG PO TABS
0.5000 mg | ORAL_TABLET | Freq: Every day | ORAL | Status: DC
  Administered 2020-03-07 – 2020-03-09 (×3): 0.5 mg via ORAL
  Filled 2020-03-06 (×3): qty 1

## 2020-03-06 MED ORDER — ONDANSETRON HCL 4 MG PO TABS: 4.0000 mg | ORAL_TABLET | Freq: Four times a day (QID) | ORAL | Status: DC | PRN

## 2020-03-06 MED ORDER — BUPIVACAINE HCL (PF) 0.25 % IJ SOLN
INTRAMUSCULAR | Status: DC | PRN
  Administered 2020-03-06: 5 mL

## 2020-03-06 MED ORDER — DEXAMETHASONE 4 MG PO TABS
4.0000 mg | ORAL_TABLET | Freq: Four times a day (QID) | ORAL | Status: DC
  Administered 2020-03-07 – 2020-03-09 (×9): 4 mg via ORAL
  Filled 2020-03-06 (×9): qty 1

## 2020-03-06 MED ORDER — CLONAZEPAM 0.5 MG PO TABS
1.0000 mg | ORAL_TABLET | Freq: Every day | ORAL | Status: DC
  Administered 2020-03-06 – 2020-03-08 (×3): 1 mg via ORAL
  Filled 2020-03-06 (×2): qty 2
  Filled 2020-03-06: qty 1

## 2020-03-06 MED ORDER — MORPHINE SULFATE (PF) 2 MG/ML IV SOLN
2.0000 mg | INTRAVENOUS | Status: DC | PRN
  Administered 2020-03-07: 2 mg via INTRAVENOUS
  Filled 2020-03-06: qty 1

## 2020-03-06 MED ORDER — 0.9 % SODIUM CHLORIDE (POUR BTL) OPTIME
TOPICAL | Status: DC | PRN
  Administered 2020-03-06: 1000 mL

## 2020-03-06 MED ORDER — FENTANYL CITRATE (PF) 100 MCG/2ML IJ SOLN
INTRAMUSCULAR | Status: DC | PRN
Start: 2020-03-06 — End: 2020-03-06
  Administered 2020-03-06 (×2): 50 ug via INTRAVENOUS
  Administered 2020-03-06: 100 ug via INTRAVENOUS
  Administered 2020-03-06: 50 ug via INTRAVENOUS

## 2020-03-06 MED ORDER — GABAPENTIN 300 MG PO CAPS
ORAL_CAPSULE | ORAL | Status: AC
  Administered 2020-03-06: 300 mg via ORAL
  Filled 2020-03-06: qty 1

## 2020-03-06 MED ORDER — SODIUM CHLORIDE (PF) 0.9 % IJ SOLN
INTRAMUSCULAR | Status: DC | PRN
  Administered 2020-03-06: 10 mL

## 2020-03-06 MED ORDER — FLUOXETINE HCL 20 MG PO CAPS
40.0000 mg | ORAL_CAPSULE | Freq: Every day | ORAL | Status: DC
  Administered 2020-03-07 – 2020-03-09 (×3): 40 mg via ORAL
  Filled 2020-03-06 (×3): qty 2

## 2020-03-06 MED ORDER — THROMBIN 5000 UNITS EX SOLR
CUTANEOUS | Status: AC
  Filled 2020-03-06: qty 5000

## 2020-03-06 MED ORDER — SODIUM CHLORIDE 0.9 % IV SOLN: INTRAVENOUS | Status: DC | PRN

## 2020-03-06 MED ORDER — DEXAMETHASONE SODIUM PHOSPHATE 4 MG/ML IJ SOLN
4.0000 mg | Freq: Four times a day (QID) | INTRAMUSCULAR | Status: DC
  Administered 2020-03-06 – 2020-03-07 (×2): 4 mg via INTRAVENOUS
  Filled 2020-03-06 (×3): qty 1

## 2020-03-06 MED ORDER — CHLORHEXIDINE GLUCONATE 0.12 % MT SOLN
OROMUCOSAL | Status: AC
  Administered 2020-03-06: 15 mL via OROMUCOSAL
  Filled 2020-03-06: qty 15

## 2020-03-06 MED ORDER — SODIUM CHLORIDE 0.9% FLUSH: 3.0000 mL | INTRAVENOUS | Status: DC | PRN

## 2020-03-06 MED ORDER — POTASSIUM CHLORIDE IN NACL 20-0.9 MEQ/L-% IV SOLN
INTRAVENOUS | Status: DC
  Filled 2020-03-06: qty 1000

## 2020-03-06 SURGICAL SUPPLY — 58 items
ADH SKN CLS APL DERMABOND .7 (GAUZE/BANDAGES/DRESSINGS) ×1
APL SKNCLS STERI-STRIP NONHPOA (GAUZE/BANDAGES/DRESSINGS) ×1
BAG DECANTER FOR FLEXI CONT (MISCELLANEOUS) ×2 IMPLANT
BASKET BONE COLLECTION (BASKET) ×2 IMPLANT
BENZOIN TINCTURE PRP APPL 2/3 (GAUZE/BANDAGES/DRESSINGS) ×2 IMPLANT
BUR CARBIDE MATCH 3.0 (BURR) ×2 IMPLANT
CAGE CONVEX CASCADIA 8.5X22X10 (Cage) ×4 IMPLANT
CANISTER SUCT 3000ML PPV (MISCELLANEOUS) ×2 IMPLANT
CAP LCK SPNE (Orthopedic Implant) ×2 IMPLANT
CAP LOCK SPINE RADIUS (Orthopedic Implant) IMPLANT
CAP LOCKING (Orthopedic Implant) ×4 IMPLANT
CNTNR URN SCR LID CUP LEK RST (MISCELLANEOUS) ×1 IMPLANT
CONT SPEC 4OZ STRL OR WHT (MISCELLANEOUS) ×2
COVER BACK TABLE 60X90IN (DRAPES) ×2 IMPLANT
DERMABOND ADVANCED (GAUZE/BANDAGES/DRESSINGS) ×1
DERMABOND ADVANCED .7 DNX12 (GAUZE/BANDAGES/DRESSINGS) ×1 IMPLANT
DRAPE C-ARM 42X72 X-RAY (DRAPES) ×4 IMPLANT
DRAPE C-ARMOR (DRAPES) ×1 IMPLANT
DRAPE LAPAROTOMY 100X72X124 (DRAPES) ×2 IMPLANT
DRAPE SURG 17X23 STRL (DRAPES) ×2 IMPLANT
DRSG OPSITE POSTOP 4X8 (GAUZE/BANDAGES/DRESSINGS) ×2 IMPLANT
DURAPREP 26ML APPLICATOR (WOUND CARE) ×2 IMPLANT
ELECT REM PT RETURN 9FT ADLT (ELECTROSURGICAL) ×2
ELECTRODE REM PT RTRN 9FT ADLT (ELECTROSURGICAL) ×1 IMPLANT
GAUZE 4X4 16PLY RFD (DISPOSABLE) IMPLANT
GLOVE BIO SURGEON STRL SZ7 (GLOVE) ×2 IMPLANT
GLOVE BIO SURGEON STRL SZ8 (GLOVE) ×4 IMPLANT
GLOVE BIOGEL PI IND STRL 7.0 (GLOVE) IMPLANT
GLOVE BIOGEL PI INDICATOR 7.0 (GLOVE) ×2
GOWN STRL REUS W/ TWL LRG LVL3 (GOWN DISPOSABLE) IMPLANT
GOWN STRL REUS W/ TWL XL LVL3 (GOWN DISPOSABLE) ×2 IMPLANT
GOWN STRL REUS W/TWL 2XL LVL3 (GOWN DISPOSABLE) IMPLANT
GOWN STRL REUS W/TWL LRG LVL3 (GOWN DISPOSABLE) ×6
GOWN STRL REUS W/TWL XL LVL3 (GOWN DISPOSABLE) ×4
HEMOSTAT POWDER KIT SURGIFOAM (HEMOSTASIS) ×1 IMPLANT
KIT ANGEL PRP (KITS) ×1 IMPLANT
KIT BASIN OR (CUSTOM PROCEDURE TRAY) ×2 IMPLANT
KIT TURNOVER KIT B (KITS) ×2 IMPLANT
MILL MEDIUM DISP (BLADE) ×2 IMPLANT
NEEDLE HYPO 25X1 1.5 SAFETY (NEEDLE) ×2 IMPLANT
NS IRRIG 1000ML POUR BTL (IV SOLUTION) ×2 IMPLANT
PACK LAMINECTOMY NEURO (CUSTOM PROCEDURE TRAY) ×2 IMPLANT
PUTTY DBM ALLOSYNC PURE 10CC (Putty) ×2 IMPLANT
ROD CURVED 45MM (Rod) ×4 IMPLANT
SCREW POLYAXIAL 6.5X45MM (Screw) ×4 IMPLANT
SET SCREW (Screw) ×4 IMPLANT
SET SCREW VRST (Screw) ×2 IMPLANT
SPONGE LAP 4X18 RFD (DISPOSABLE) IMPLANT
SPONGE SURGIFOAM ABS GEL 100 (HEMOSTASIS) ×2 IMPLANT
STRIP CLOSURE SKIN 1/2X4 (GAUZE/BANDAGES/DRESSINGS) ×3 IMPLANT
SUT VIC AB 0 CT1 18XCR BRD8 (SUTURE) ×1 IMPLANT
SUT VIC AB 0 CT1 8-18 (SUTURE) ×2
SUT VIC AB 2-0 CP2 18 (SUTURE) ×2 IMPLANT
SUT VIC AB 3-0 SH 8-18 (SUTURE) ×4 IMPLANT
TOWEL GREEN STERILE (TOWEL DISPOSABLE) ×2 IMPLANT
TOWEL GREEN STERILE FF (TOWEL DISPOSABLE) ×2 IMPLANT
TRAY FOLEY MTR SLVR 16FR STAT (SET/KITS/TRAYS/PACK) ×2 IMPLANT
WATER STERILE IRR 1000ML POUR (IV SOLUTION) ×2 IMPLANT

## 2020-03-06 NOTE — Op Note (Signed)
03/06/2020  2:40 PM  PATIENT:  Brittany [name]  74 y.o. female  PRE-OPERATIVE DIAGNOSIS:  1.  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  Scoliosis with loss of sagittal and coronal balance, 3.  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  Back and leg pain  POST-OPERATIVE DIAGNOSIS:  same  PROCEDURE:   1. Decompressive lumbar laminectomy, facetectomy and foraminotomies with Smith-Petersen osteotomy L3-4 requiring more work than would be required for a simple exposure of the disk for PLIF in order to adequately decompress the neural elements and address the spinal stenosis and correct the curvature 2. Posterior lumbar interbody fusion L3-4 using titanium interbody cages packed with morcellized allograft and autograft soaked with bone aspirate obtained through a separate fascial incision of the right iliac crest 3. Posterior fixation L3-4 using Stryker  pedicle screws.  4. Intertransverse arthrodesis L3-4 using morcellized autograft and allograft. 5.  Removal of instrumentation L4-5  SURGEON:  Marikay Alar, MD  ASSISTANTS: Verlin Dike, FNP  ANESTHESIA:  General  EBL: 300 ml  Total I/O In: 1000 [I.V.:1000] Out: 600 [Urine:300; Blood:300]  BLOOD ADMINISTERED:none  DRAINS: none   INDICATION FOR PROCEDURE: This patient presented with back and leg pain. Imaging revealed severe adjacent level stenosis with disc herniation L3-4 with scoliosis curvature to the left centered at L3-4. The patient tried a reasonable attempt at conservative medical measures without relief. I recommended decompression and instrumented fusion to address the stenosis as well as the segmental  instability.  Patient understood the risks, benefits, and alternatives and potential outcomes and wished to proceed.  PROCEDURE DETAILS:  The patient was brought to the operating room. After induction of generalized endotracheal anesthesia the patient was rolled into the prone position on chest rolls and all pressure points were padded. The  patient's lumbar region was cleaned and then prepped with DuraPrep and draped in the usual sterile fashion. Anesthesia was injected and then a dorsal midline incision was made and carried down to the lumbosacral fascia. The fascia was opened and the paraspinous musculature was taken down in a subperiosteal fashion to expose L3-4 as well as the previously placed instrumentation at L4-5. A self-retaining retractor was placed.  The locking caps were removed from the pedicle screws at L4 and L5 and the rods were removed.  Intraoperative fluoroscopy confirmed my level. I then dissected in a suprafascial plane to expose the iliac crest.  Opened the fascia and we used a Jamshidi needle to extract 60 cc of bone marrow aspirate from the iliac crest with the assistance of my nurse practitioner.  This was then spun down by [facility] device and 2 to 4 cc of  BMAC was soaked on morselized allograft for later arthrodesis.  I dried the hole with Surgifoam and closed the fascia.  I then turned my attention to the decompression and complete lumbar laminectomies, hemi- facetectomies, and foraminotomies were performed at L3-4.  Evlyn Clines osteotomies were performed by removing the pars and the facet complex as well as the posterior elements in order to achieve correction of her curvature. my nurse practitioner was directly involved in the decompression and exposure of the neural elements. the patient had significant spinal stenosis and this required more work than would be required for a simple exposure of the disc for posterior lumbar interbody fusion which would only require a limited laminotomy. Much more generous decompression and generous foraminotomy was undertaken in order to adequately decompress the neural elements and address the patient's leg pain. The yellow ligament was removed to expose  the underlying dura and nerve roots, and generous foraminotomies were performed to adequately decompress the neural elements. Both the  exiting and traversing nerve roots were decompressed on both sides until a coronary dilator passed easily along the nerve roots. Once the decompression was complete, I turned my attention to the posterior lower lumbar interbody fusion. The epidural venous vasculature was coagulated and cut sharply. Disc space was incised and the initial discectomy was performed with pituitary rongeurs. The disc space was distracted with sequential distractors to a height of 10 mm. We then used a series of scrapers and shavers to prepare the endplates for fusion. The midline was prepared with Epstein curettes. Once the complete discectomy was finished, we packed an appropriate sized interbody cage with local autograft and morcellized allograft, gently retracted the nerve root, and tapped the cage into position at L3-4.  The midline between the cages was packed with morselized autograft and allograft. We then turned our attention to the placement of the L3 pedicle screws. The pedicle screw entry zones were identified utilizing surface landmarks and fluoroscopy. I probed each pedicle with the pedicle probe, and tapped each pedicle with the appropriate 5.5 tap. We palpated with a ball probe to assure no break in the cortex. We then placed 6.5 x 45 mm pedicle screws into the pedicles bilaterally at L3.  My nurse practitioner assisted in placement of the pedicle screws.  We then decorticated the transverse processes and laid a mixture of morcellized autograft and allograft out over these to perform intertransverse arthrodesis at L3-4. We then placed lordotic rods into the multiaxial screw heads of the pedicle screws and locked these in position with the locking caps and anti-torque device. We then checked our construct with AP and lateral fluoroscopy. Irrigated with copious amounts of bacitracin-containing saline solution. Inspected the nerve roots once again to assure adequate decompression, lined to the dura with Gelfoam, placed  powdered vancomycin into the wound, and then we closed the muscle and the fascia with 0 Vicryl. Closed the subcutaneous tissues with 2-0 Vicryl and subcuticular tissues with 3-0 Vicryl. The skin was closed with benzoin and Steri-Strips. Dressing was then applied, the patient was awakened from general anesthesia and transported to the recovery room in stable condition. At the end of the procedure all sponge, needle and instrument counts were correct.   PLAN OF CARE: admit to inpatient  PATIENT DISPOSITION:  PACU - hemodynamically stable.   Delay start of Pharmacological VTE agent (>24hrs) due to surgical blood loss or risk of bleeding:  yes

## 2020-03-06 NOTE — Anesthesia Preprocedure Evaluation (Addendum)
Anesthesia Evaluation  Patient identified by MRN, date of birth, ID band Patient awake    Reviewed: Allergy & Precautions, H&P , NPO status , Patient's Chart, lab work & pertinent test results  Airway Mallampati: II  TM Distance: >3 FB Neck ROM: Full    Dental  (+) Dental Advisory Given   Pulmonary asthma ,    Pulmonary exam normal breath sounds clear to auscultation       Cardiovascular negative cardio ROS Normal cardiovascular exam Rhythm:Regular Rate:Normal     Neuro/Psych  Headaches, PSYCHIATRIC DISORDERS Anxiety Depression  Neuromuscular disease    GI/Hepatic   Endo/Other    Renal/GU      Musculoskeletal  (+) Arthritis ,   Abdominal   Peds  Hematology  (+) Blood dyscrasia, anemia ,   Anesthesia Other Findings   Reproductive/Obstetrics                            Anesthesia Physical  Anesthesia Plan  ASA: II  Anesthesia Plan: General   Post-op Pain Management:    Induction: Intravenous  PONV Risk Score and Plan: 3 and Ondansetron, Dexamethasone, Midazolam and Treatment may vary due to age or medical condition  Airway Management Planned: Oral ETT  Additional Equipment: None  Intra-op Plan:   Post-operative Plan: Extubation in OR  Informed Consent: I have reviewed the patients History and Physical, chart, labs and discussed the procedure including the risks, benefits and alternatives for the proposed anesthesia with the patient or authorized representative who has indicated his/her understanding and acceptance.     Dental advisory given  Plan Discussed with: CRNA  Anesthesia Plan Comments:        Anesthesia Quick Evaluation

## 2020-03-06 NOTE — Anesthesia Procedure Notes (Signed)
Procedure Name: Intubation Date/Time: 03/06/2020 12:04 PM Performed by: Babs Bertin, CRNA Pre-anesthesia Checklist: Patient identified, Emergency Drugs available, Suction available and Patient being monitored Patient Re-evaluated:Patient Re-evaluated prior to induction Oxygen Delivery Method: Circle System Utilized Preoxygenation: Pre-oxygenation with 100% oxygen Induction Type: IV induction Ventilation: Mask ventilation without difficulty Laryngoscope Size: Mac and 3 Grade View: Grade II Tube type: Oral Tube size: 7.0 mm Number of attempts: 1 Airway Equipment and Method: Stylet and Oral airway Placement Confirmation: ETT inserted through vocal cords under direct vision,  positive ETCO2 and breath sounds checked- equal and bilateral Secured at: 21 cm Tube secured with: Tape Dental Injury: Teeth and Oropharynx as per pre-operative assessment

## 2020-03-06 NOTE — H&P (Signed)
Subjective: Patient is a 74 y.o. female admitted for adjacent level stenosis. Onset of symptoms was several months ago, gradually worsening since that time.  The pain is rated severe, and is located at the across the lower back and radiates to legs. The pain is described as aching and occurs all day. The symptoms have been progressive. Symptoms are exacerbated by exercise. MRI or CT showed adjacent level stenosis with HNP L3-4   Past Medical History:  Diagnosis Date  . Anemia    h/o  . Anxiety   . Arthritis   . Asthma    as child  no problem since  . Degenerative spondylolisthesis    L4-5, grade 1  . Depression   . Headache    hx migraines, tension headaches  . HLD (hyperlipidemia)   . Hypercholesteremia   . Left lumbar radiculopathy   . Lumbago   . Lumbar degenerative disc disease   . Lumbar spondylosis   . Lumbar stenosis with neurogenic claudication    L4-5  . OCD (obsessive compulsive disorder)   . Osteoporosis   . Post-nasal drip   . Slow transit constipation   . Spinal stenosis   . Vitamin D deficiency   . Weakness of both lower extremities     Past Surgical History:  Procedure Laterality Date  . adnoids    . ANTERIOR CERVICAL DECOMP/DISCECTOMY FUSION N/A 03/22/2018   Procedure: ANTERIOR CERVICAL DECOMPRESSION/DISCECTOMY FUSION CERVICAL FOUR - CERVICAL FIVE, CERVICAL FIVE - CERVICAL SIX, CERVICAL SIX- CERVICAL SEVEN;  Surgeon: Shirlean Kelly, MD;  Location: [facility] OR;  Service: Neurosurgery;  Laterality: N/A;  ANTERIOR CERVICAL DECOMPRESSION/DISCECTOMY FUSION CERVICAL FOUR - CERVICAL FIVE, CERVICAL FIVE - CERVICAL SIX, CERVICAL SIX- CERVICAL SEVEN  . BACK SURGERY  2017  . CHOLECYSTECTOMY N/A 08/15/2017   Procedure: LAPAROSCOPIC CHOLECYSTECTOMY;  Surgeon: Abigail Miyamoto, MD;  Location: [facility] OR;  Service: General;  Laterality: N/A;  . EYE SURGERY     bil cataract  . TONSILLECTOMY     t+a    Prior to Admission medications   Medication Sig Start Date End Date Taking?  Authorizing Provider  acetaminophen (TYLENOL) 500 MG tablet Take 500 mg by mouth every 6 (six) hours as needed (for pain. (max 6 tablets/24hrs.)).    Yes [provider]  alendronate (FOSAMAX) 70 MG tablet Take 70 mg by mouth every Wednesday. Take with a full glass of water on an empty stomach.    Yes [provider]  Calcium Carbonate Antacid (TUMS ULTRA 1000 PO) Take 1,000 mg by mouth daily in the afternoon.   Yes [provider]  Cholecalciferol (VITAMIN D3) 50 MCG (2000 UT) TABS Take 2,000 Units by mouth daily in the afternoon.   Yes [provider]  clonazePAM (KLONOPIN) 1 MG tablet Take 0.5-1 mg by mouth See admin instructions. Take 1 tablet (1 mg) by mouth at bedtime & take 0.5 tablet (0.5 mg) by mouth at 0500 in the morning. 02/16/18  Yes [provider]  docusate sodium (COLACE) 100 MG capsule Take 200 mg by mouth daily.   Yes [provider]  FLUoxetine (PROZAC) 40 MG capsule Take 40 mg by mouth daily. 02/20/18  Yes [provider]  loratadine (CLARITIN) 10 MG tablet Take 10 mg by mouth daily.    Yes [provider]  Magnesium Hydroxide (MAGNESIA PO) Take 1 tablet by mouth daily.   Yes [provider]  naproxen sodium (ALEVE) 220 MG tablet Take 220 mg by mouth 2 (two) times daily.  Yes [provider]  Omega-3 Fatty Acids (FISH OIL) 1200 MG CAPS Take 1,200 mg by mouth daily in the afternoon.    Yes [provider]  Polyethyl Glycol-Propyl Glycol (LUBRICANT EYE DROPS) 0.4-0.3 % SOLN Place 1 drop into both eyes daily. (Dry/irritated eyes)   Yes [provider]  polyethylene glycol (MIRALAX / GLYCOLAX) packet Take 17 g by mouth daily in the afternoon.    Yes [provider]  simvastatin (ZOCOR) 40 MG tablet Take 40 mg by mouth daily in the afternoon.    Yes [provider]  tiZANidine (ZANAFLEX) 4 MG tablet Take 4 mg by mouth See admin instructions. Take 1 tablet (4 mg) by  mouth at bedtime & take 1 tablet (4 mg) by mouth at 0500 in the morning. 07/25/17  Yes [provider]   Allergies  Allergen Reactions  . Tetracyclines & Related Other (See Comments)    Throat ulcer  . Penicillins     UNSPECIFIED REACTION  Has patient had a PCN reaction causing immediate rash, facial/tongue/throat swelling, SOB or lightheadedness with hypotension: No Has patient had a PCN reaction causing severe rash involving mucus membranes or skin necrosis: NO Has patient had a PCN reaction that required hospitalization No Has patient had a PCN reaction occurring within the last 10 years: No If all of the above answers are "NO", then may proceed with Cephalosporin     Social History   Tobacco Use  . Smoking status: Never Smoker  . Smokeless tobacco: Never Used  Substance Use Topics  . Alcohol use: No    History reviewed. No pertinent family history.   Review of Systems  Positive ROS: neg  All other systems have been reviewed and were otherwise negative with the exception of those mentioned in the HPI and as above.  Objective: Vital signs in last 24 hours: Temp:  [98.2 F (36.8 C)] 98.2 F (36.8 C) (08/27 1021) Pulse Rate:  [67] 67 (08/27 1021) Resp:  [17] 17 (08/27 1021) BP: (104)/(56) 104/56 (08/27 1021) SpO2:  [97 %] 97 % (08/27 1021) Weight:  [58.1 kg] 58.1 kg (08/27 1021)  General Appearance: Alert, cooperative, no distress, appears stated age Head: Normocephalic, without obvious abnormality, atraumatic Eyes: PERRL, conjunctiva/corneas clear, EOM's intact    Neck: Supple, symmetrical, trachea midline Back: Symmetric, no curvature, ROM normal, no CVA tenderness Lungs:  respirations unlabored Heart: Regular rate and rhythm Abdomen: Soft, non-tender Extremities: Extremities normal, atraumatic, no cyanosis or edema Pulses: 2+ and symmetric all extremities Skin: Skin color, texture, turgor normal, no rashes or lesions  NEUROLOGIC:   Mental status:  Alert and oriented x4,  no aphasia, good attention span, fund of knowledge, and memory Motor Exam - grossly normal Sensory Exam - grossly normal Reflexes: 1+ Coordination - grossly normal Gait - grossly normal Balance - grossly normal Cranial Nerves: I: smell Not tested  II: visual acuity  OS: nl    OD: nl  II: visual fields Full to confrontation  II: pupils Equal, round, reactive to light  III,VII: ptosis None  III,IV,VI: extraocular muscles  Full ROM  V: mastication Normal  V: facial light touch sensation  Normal  V,VII: corneal reflex  Present  VII: facial muscle function - upper  Normal  VII: facial muscle function - lower Normal  VIII: hearing Not tested  IX: soft palate elevation  Normal  IX,X: gag reflex Present  XI: trapezius strength  5/5  XI: sternocleidomastoid strength 5/5  XI: neck flexion strength  5/5  XII: tongue strength  Normal    Data Review Lab Results  Component Value Date   WBC 5.5 03/03/2020   HGB 13.4 03/03/2020   HCT 42.7 03/03/2020   MCV 97.3 03/03/2020   PLT 194 03/03/2020   Lab Results  Component Value Date   NA 139 03/03/2020   K 3.6 03/03/2020   CL 104 03/03/2020   CO2 25 03/03/2020   BUN 14 03/03/2020   CREATININE 0.85 03/03/2020   GLUCOSE 84 03/03/2020   Lab Results  Component Value Date   INR 1.0 03/03/2020    Assessment/Plan:  Estimated body mass index is 22.67 kg/m as calculated from the following:   Height as of this encounter: 5\' 3"  (1.6 m).   Weight as of this encounter: 58.1 kg. Patient admitted for PLIF L3-4. Patient has failed a reasonable attempt at conservative therapy.  I explained the condition and procedure to the patient and answered any questions.  Patient wishes to proceed with procedure as planned. Understands risks/ benefits and typical outcomes of procedure.   03/06/2020 10:40 AM

## 2020-03-06 NOTE — Progress Notes (Signed)
Pharmacy Antibiotic Note  Brittany [name] is a 74 y.o. female admitted on 03/06/2020 with surgical prophylaxis.  Pharmacy has been consulted for vancomycin dosing.  Plan: Post-op vancomycin 1000 mg IV dose x 1, no drain noted. Pharmacy will sign off.  Height: 5\' 3"  (160 cm) Weight: 58.1 kg (128 lb) IBW/kg (Calculated) : 52.4  Temp (24hrs), Avg:97.7 F (36.5 C), Min:97.2 F (36.2 C), Max:98.2 F (36.8 C)  Recent Labs  Lab 03/03/20 1048  WBC 5.5  CREATININE 0.85    Estimated Creatinine Clearance: 48 mL/min (by C-G formula based on SCr of 0.85 mg/dL).    Allergies  Allergen Reactions  . Tetracyclines & Related Other (See Comments)    Throat ulcer  . Penicillins     UNSPECIFIED REACTION  Has patient had a PCN reaction causing immediate rash, facial/tongue/throat swelling, SOB or lightheadedness with hypotension: No Has patient had a PCN reaction causing severe rash involving mucus membranes or skin necrosis: NO Has patient had a PCN reaction that required hospitalization No Has patient had a PCN reaction occurring within the last 10 years: No If all of the above answers are "NO", then may proceed with Cephalosporin       Thank you for allowing 03/05/20 to participate in this patients care.   [place], PharmD Please see amion for complete clinical pharmacist phone list. 03/06/2020 5:47 PM

## 2020-03-06 NOTE — Transfer of Care (Signed)
Immediate Anesthesia Transfer of Care Note  Patient: Brittany [name] Main  Procedure(s) Performed: LUMBAR THREE-FOUR POSTERIOR LUMBAR INTERBODY FUSION (N/A Spine Lumbar)  Patient Location: PACU  Anesthesia Type:General  Level of Consciousness: awake, alert  and oriented  Airway & Oxygen Therapy: Patient Spontanous Breathing and Patient connected to face mask oxygen  Post-op Assessment: Report given to RN and Post -op Vital signs reviewed and stable  Post vital signs: Reviewed and stable  Last Vitals:  Vitals Value Taken Time  BP 96/48 03/06/20 1509  Temp 36.2 C 03/06/20 1450  Pulse 62 03/06/20 1510  Resp 14 03/06/20 1510  SpO2 97 % 03/06/20 1510  Vitals shown include unvalidated device data.  Last Pain:  Vitals:   03/06/20 1450  TempSrc:   PainSc: Asleep         Complications: No complications documented.

## 2020-03-07 DIAGNOSIS — M4310 Spondylolisthesis, site unspecified: Secondary | ICD-10-CM | POA: Diagnosis present

## 2020-03-07 NOTE — TOC Initial Note (Signed)
Transition of Care [facility]) - Initial/Assessment Note    Patient Details  Name: Brittany [name] MRN: [id number] Date of Birth: May 14, 1946  [facility] CM/SW Contact:    Varney Baas Phone Number: 03/07/2020, 3:06 PM  Clinical Narrative:                 CSW met with patient bedside to discuss PT recommendation of SNF placement. Patient stated she is in agreement and provided permission to be faxed out. Patient stated she has been to [facility] in the past. PTA, patient lived alone and was independent. Patient provided permission for her sister Juliann Pulse to be contacted.  Expected Discharge Plan: Skilled Nursing Facility Barriers to Discharge: Continued Medical Work up   Patient Goals and CMS Choice Patient states their goals for this hospitalization and ongoing recovery are:: to get strong enough to return home CMS Medicare.gov Compare Post Acute Care list provided to:: Patient Choice offered to / list presented to : Patient  Expected Discharge Plan and Services Expected Discharge Plan: [place] In-house Referral: Clinical Social Work   Post Acute Care Choice: [place] Living arrangements for the past 2 months: [place]                                      Prior Living Arrangements/Services Living arrangements for the past 2 months: Single Family Home Lives with:: Self Patient language and need for interpreter reviewed:: Yes Do you feel safe going back to the place where you live?: Yes      Need for Family Participation in Patient Care: Yes (Comment) Care giver support system in place?: Yes (comment)   Criminal Activity/Legal Involvement Pertinent to Current Situation/Hospitalization: No - Comment as needed  Activities of Daily Living      Permission Sought/Granted Permission sought to share information with : Facility Sport and exercise psychologist, Family Supports Permission granted to share information  with : Yes, Verbal Permission Granted  Share Information with NAME: Sharmon Leyden  Permission granted to share info w AGENCY: SNFs  Permission granted to share info w Relationship: Sister  Permission granted to share info w Contact Information: [phone]  Emotional Assessment   Attitude/Demeanor/Rapport: Engaged Affect (typically observed): Appropriate Orientation: : Oriented to Place, Oriented to  Time, Oriented to Self, Oriented to Situation Alcohol / Substance Use: Not Applicable Psych Involvement: No (comment)  Admission diagnosis:  S/P lumbar fusion [Z98.1] Patient Active Problem List   Diagnosis Date Noted  . S/P lumbar fusion 03/06/2020  . HNP (herniated nucleus pulposus), cervical 03/22/2018  . S/P laparoscopic cholecystectomy 08/15/2017  . Lumbar stenosis with neurogenic claudication 10/05/2015   PCP:  Mayra Neer, MD Pharmacy:   [facility] [address] [place] - [place] AT [place] [address] [place] [address] Phone: [phone] Fax: [phone]  [id number] [place], [place] - [place] AT [place] [address] Phone: [phone] Fax: [phone]     Social Determinants of Health (Micro) Interventions    Readmission Risk Interventions No flowsheet data found.

## 2020-03-07 NOTE — NC FL2 (Signed)
MEDICAID FL2 LEVEL OF CARE SCREENING TOOL     IDENTIFICATION  Patient Name: Brittany [name] Birthdate: 09-27-1945 Sex: female Admission Date (Current Location): 03/06/2020  [facility] and [place] Number:  Producer, television/film/video and Address:  [facility] [place]. [facility], [address], [place], [place] [address]      Provider Number: [id number]  Attending Physician Name and Address:  Tia Alert, MD  Relative Name and Phone Number:  Avis Epley, sister    Current Level of Care: Hospital Recommended Level of Care: Skilled Nursing Facility Prior Approval Number:    Date Approved/Denied:   PASRR Number: [id number]  Discharge Plan: SNF    Current Diagnoses: Patient Active Problem List   Diagnosis Date Noted  . S/P lumbar fusion 03/06/2020  . HNP (herniated nucleus pulposus), cervical 03/22/2018  . S/P laparoscopic cholecystectomy 08/15/2017  . Lumbar stenosis with neurogenic claudication 10/05/2015    Orientation RESPIRATION BLADDER Height & Weight     Self, Time, Situation, Place  Normal Continent Weight: 128 lb (58.1 kg) Height:  5\' 3"  (160 cm)  BEHAVIORAL SYMPTOMS/MOOD NEUROLOGICAL BOWEL NUTRITION STATUS      Continent Diet (See discharge summary)  AMBULATORY STATUS COMMUNICATION OF NEEDS Skin   Limited Assist Verbally Other (Comment) (Closed incision on back)                       Personal Care Assistance Level of Assistance  Bathing, Feeding, Dressing Bathing Assistance: Limited assistance Feeding assistance: Independent Dressing Assistance: Limited assistance     Functional Limitations Info  Sight, Hearing, Speech Sight Info: Adequate Hearing Info: Adequate Speech Info: Adequate    SPECIAL CARE FACTORS FREQUENCY  PT (By licensed PT), OT (By licensed OT)     PT Frequency: 5x a week OT Frequency: 5x a week            Contractures Contractures Info: Not present    Additional Factors Info  Code Status, Allergies  Code Status Info: FULL Allergies Info: Tetracyclines & Related; Penicillins           Current Medications (03/07/2020):  This is the current hospital active medication list Current Facility-Administered Medications  Medication Dose Route Frequency Provider Last Rate Last Admin  . 0.9 %  sodium chloride infusion  250 mL Intravenous Continuous 03/09/2020, MD      . 0.9 % NaCl with KCl 20 mEq/ L  infusion   Intravenous Continuous Tia Alert, MD 75 mL/hr at 03/06/20 1758 New Bag at 03/06/20 1758  . acetaminophen (TYLENOL) tablet 650 mg  650 mg Oral Q4H PRN 03/08/20, MD   650 mg at 03/07/20 03/09/20   Or  . acetaminophen (TYLENOL) suppository 650 mg  650 mg Rectal Q4H PRN 6270, MD      . celecoxib (CELEBREX) capsule 200 mg  200 mg Oral Q12H Tia Alert, MD   200 mg at 03/07/20 03/09/20  . clonazePAM (KLONOPIN) tablet 0.5 mg  0.5 mg Oral Daily 3500, MD   0.5 mg at 03/07/20 03/09/20  . clonazePAM (KLONOPIN) tablet 1 mg  1 mg Oral QHS 9381, MD   1 mg at 03/06/20 2100  . dexamethasone (DECADRON) injection 4 mg  4 mg Intravenous Q6H 2101, MD   4 mg at 03/07/20 0004   Or  . dexamethasone (DECADRON) tablet 4 mg  4 mg Oral Q6H 03/09/20, MD  4 mg at 03/07/20 1319  . docusate sodium (COLACE) capsule 200 mg  200 mg Oral Daily Tia Alert, MD   200 mg at 03/07/20 0867  . FLUoxetine (PROZAC) capsule 40 mg  40 mg Oral Daily Tia Alert, MD   40 mg at 03/07/20 0925  . menthol-cetylpyridinium (CEPACOL) lozenge 3 mg  1 lozenge Oral PRN Tia Alert, MD       Or  . phenol [facility]) mouth spray 1 spray  1 spray Mouth/Throat PRN Tia Alert, MD      . methocarbamol (ROBAXIN) tablet 500 mg  500 mg Oral Q6H PRN Tia Alert, MD   500 mg at 03/07/20 6195   Or  . methocarbamol (ROBAXIN) 500 mg in dextrose 5 % 50 mL IVPB  500 mg Intravenous Q6H PRN Tia Alert, MD      . morphine 2 MG/ML injection 2 mg  2 mg Intravenous Q2H PRN Tia Alert, MD    2 mg at 03/07/20 0231  . ondansetron (ZOFRAN) tablet 4 mg  4 mg Oral Q6H PRN Tia Alert, MD       Or  . ondansetron [facility]) injection 4 mg  4 mg Intravenous Q6H PRN Tia Alert, MD      . oxyCODONE (Oxy IR/ROXICODONE) immediate release tablet 5 mg  5 mg Oral Q3H PRN Tia Alert, MD   5 mg at 03/07/20 0007  . polyvinyl alcohol (LIQUIFILM TEARS) 1.4 % ophthalmic solution 1 drop  1 drop Both Eyes Daily Tia Alert, MD      . senna [facility]) tablet 8.6 mg  1 tablet Oral BID Tia Alert, MD   8.6 mg at 03/07/20 0932  . sodium chloride flush (NS) 0.9 % injection 3 mL  3 mL Intravenous Q12H Tia Alert, MD   3 mL at 03/07/20 0925  . sodium chloride flush (NS) 0.9 % injection 3 mL  3 mL Intravenous PRN Tia Alert, MD         Discharge Medications: Please see discharge summary for a list of discharge medications.  Relevant Imaging Results:  Relevant Lab Results:   Additional Information SSN [id number]  Cleda Clarks, [place]

## 2020-03-07 NOTE — Progress Notes (Signed)
Patient transferred to [phone]. Report given to receiving RN Karena Addison. Patient in no  Signs of distress at the time of transfer.

## 2020-03-07 NOTE — Evaluation (Signed)
Physical Therapy Evaluation Patient Details Name: Brittany [name] MRN: [id number] DOB: 20-Sep-1945 Today's Date: 03/07/2020   History of Present Illness  74 year old with MRI or CT showed adjacent level stenosis with HNP L3-4.  Pt admitted for L3-4 PLIF.  Clinical Impression  Pt feeling weak and 'funny' today when up.  She is having hard time staying awake. Her BP is up to 95/54.  Pt walked in halls with RW and brace. No loss of balance but tired easily.  My concern is that she lives alone - she said she never thought of trying to see if she could have someone come help her for a few days.  I anticipate she will need some intial help (more than food that her church is providing) and have written for short term SNF - pt in agreement as doesn't feel strong enough to be on her own.  PT will follow daily. I encouraged pt to drink plenty of water and walk with nursing staff and RW during the day.    Follow Up Recommendations SNF;Supervision for mobility/OOB (if not to SNF - will need HH and more help at home)    Equipment Recommendations  None recommended by PT    Recommendations for Other Services       Precautions / Restrictions Precautions Precautions: Fall Precaution Comments: Pt reports she hasnt fallen since Dec - fell on her sidewalk Required Braces or Orthoses: Spinal Brace Spinal Brace: Lumbar corset;Applied in sitting position Restrictions Weight Bearing Restrictions: No Other Position/Activity Restrictions: Pt educatd on back precautions and able to verbalize      Mobility  Bed Mobility Overal bed mobility: Modified Independent             General bed mobility comments: Pt reminded on good body mechanics for bed mobilty -s he said she remembers doing this after last back surgry - i encouraged her to continue these forever.  I talked about adding bed rail to her bed to help remind her  Transfers Overall transfer level: Needs assistance Equipment used: Rolling walker  (2 wheeled) Transfers: Sit to/from Stand Sit to Stand: Min guard         General transfer comment: Pt cued for hand placement and good body mechanics.  Pt toileted with cues for pericare  Ambulation/Gait Ambulation/Gait assistance: Min guard   Assistive device: Rolling walker (2 wheeled) Gait Pattern/deviations: Trunk flexed;Decreased stride length     General Gait Details: Pt feeling 'funny' but not really dizzy.  she walked with cues to stand taller.  No loss of balance seen  Stairs            Wheelchair Mobility    Modified Rankin (Stroke Patients Only)       Balance Overall balance assessment: Mild deficits observed, not formally tested                                           Pertinent Vitals/Pain Pain Assessment: 0-10 Pain Score: 5  Pain Location: back Pain Descriptors / Indicators: Aching;Pressure;Discomfort Pain Intervention(s): Limited activity within patient's tolerance;Monitored during session;Repositioned    Home Living Family/patient expects to be discharged to:: Private residence Living Arrangements: Alone Available Help at Discharge: Friend(s) Type of Home: House       Home Layout: One level Home Equipment: Environmental consultant - 2 wheels;Tub bench Additional Comments: Pt reports she is able to get up  and down from toilet easiliy - not hard for her.  Her church is helping with meals etc at [place]    Prior Function Level of Independence: Independent with assistive device(s)         Comments: Pt did limited cleaning, ordered food online and went and picked it up.     Hand Dominance        Extremity/Trunk Assessment        Lower Extremity Assessment Lower Extremity Assessment: Generalized weakness    Cervical / Trunk Assessment Cervical / Trunk Assessment:  (Pt stood with forward flexion - educated on more erct posture)  Communication   Communication: No difficulties  Cognition Arousal/Alertness:  Awake/alert;Lethargic Behavior During Therapy: Flat affect Overall Cognitive Status: Impaired/Different from baseline                                 General Comments: Pt feels groggy - hard waking up and wanting to sleep all day.  BP lower - up now to 95/54      General Comments General comments (skin integrity, edema, etc.): incison with bandage intact - no additional drainage    Exercises     Assessment/Plan    PT Assessment Patient needs continued PT services  PT Problem List Decreased strength;Decreased mobility;Decreased knowledge of precautions;Decreased activity tolerance;Decreased skin integrity;Decreased balance;Pain       PT Treatment Interventions DME instruction;Therapeutic activities;Gait training;Therapeutic exercise;Patient/family education;Functional mobility training    PT Goals (Current goals can be found in the Care Plan section)  Acute Rehab PT Goals Patient Stated Goal: to be safe when i get home.  To be able to take care of my 3 cats PT Goal Formulation: With patient Time For Goal Achievement: 03/13/20 Potential to Achieve Goals: Good    Frequency 7X/week   Barriers to discharge Decreased caregiver support Pt said she never thought about having someone come in and help her at DC    Co-evaluation               AM-PAC PT "6 Clicks" Mobility  Outcome Measure Help needed turning from your back to your side while in a flat bed without using bedrails?: A Little Help needed moving from lying on your back to sitting on the side of a flat bed without using bedrails?: A Little Help needed moving to and from a bed to a chair (including a wheelchair)?: A Little Help needed standing up from a chair using your arms (e.g., wheelchair or bedside chair)?: A Little Help needed to walk in hospital room?: A Little Help needed climbing 3-5 steps with a railing? : A Little 6 Click Score: 18    End of Session Equipment Utilized During Treatment:  Gait belt;Back brace Activity Tolerance: Patient tolerated treatment well;No increased pain Patient left: in chair;with chair alarm set;with call bell/phone within reach Nurse Communication: Mobility status;Precautions PT Visit Diagnosis: Unsteadiness on feet (R26.81);Muscle weakness (generalized) (M62.81);Pain    Time: 9798-9211 PT Time Calculation (min) (ACUTE ONLY): 40 min   Charges:   PT Evaluation $PT Eval Low Complexity: 1 Low PT Treatments $Gait Training: 8-22 mins $Therapeutic Activity: 8-22 mins        03/07/2020   Ranae Palms, PT   Judson Roch 03/07/2020, 11:38 AM

## 2020-03-07 NOTE — Evaluation (Signed)
Occupational Therapy Evaluation Patient Details Name: Brittany [name] MRN: [id number] DOB: 1946-02-19 Today's Date: 03/07/2020    History of Present Illness 74 year old with MRI or CT showed adjacent level stenosis with HNP L3-4.  Pt admitted for L3-4 PLIF.   Clinical Impression   Pt admitted with the above diagnoses and presents with below problem list. Pt will benefit from continued acute OT to address the below listed deficits and maximize independence with basic ADLs prior to d/c to venue below. PTA pt was mod I with ADLs, lives alone. Pt currently min guard to min A with LB ADLs, toilet transfers, and pericare; setup with UB ADLs. Pt expressed feeling concerned with returning home too soon as she lives alone and has limited assist available. Discussed ST SNF as a possibility.     Follow Up Recommendations  SNF;Supervision/Assistance - 24 hour    Equipment Recommendations  3 in 1 bedside commode    Recommendations for Other Services       Precautions / Restrictions Precautions Precautions: Fall Precaution Comments: Pt reports she hasnt fallen since Dec - fell on her sidewalk Required Braces or Orthoses: Spinal Brace Spinal Brace: Lumbar corset;Applied in sitting position Restrictions Weight Bearing Restrictions: No Other Position/Activity Restrictions: Pt educatd on back precautions and able to verbalize      Mobility Bed Mobility Overal bed mobility: Modified Independent             General bed mobility comments: EOB >sidelying>supine  Transfers Overall transfer level: Needs assistance Equipment used: Rolling walker (2 wheeled) Transfers: Sit to/from Stand Sit to Stand: Min guard;Min assist         General transfer comment: min A to boost from regular height surfaces, min guard from elevated seat    Balance Overall balance assessment: Mild deficits observed, not formally tested                                         ADL either  performed or assessed with clinical judgement   ADL Overall ADL's : Needs assistance/impaired Eating/Feeding: Set up;Sitting   Grooming: Min guard;Wash/dry hands;Standing   Upper Body Bathing: Set up;Sitting   Lower Body Bathing: Sit to/from stand;Minimal assistance   Upper Body Dressing : Set up;Sitting   Lower Body Dressing: Sit to/from stand;Minimal assistance   Toilet Transfer: Ambulation;RW;Minimal assistance   Toileting- Clothing Manipulation and Hygiene: Sitting/lateral lean;Sit to/from stand;Minimal assistance       Functional mobility during ADLs: Min guard;Minimal assistance General ADL Comments: min A to boost from regular height toilet with grab bars     Vision         Perception     Praxis      Pertinent Vitals/Pain Pain Assessment: 0-10 Pain Score: 2  Pain Location: back Pain Descriptors / Indicators: Burning Pain Intervention(s): Limited activity within patient's tolerance;Monitored during session;Repositioned     Hand Dominance     Extremity/Trunk Assessment Upper Extremity Assessment Upper Extremity Assessment: Overall WFL for tasks assessed   Lower Extremity Assessment Lower Extremity Assessment: Defer to PT evaluation   Cervical / Trunk Assessment Cervical / Trunk Assessment:  (Pt stood with forward flexion - educated on more erct posture)   Communication Communication Communication: No difficulties   Cognition Arousal/Alertness: Awake/alert Behavior During Therapy: Flat affect Overall Cognitive Status: No family/caregiver present to determine baseline cognitive functioning  General Comments: internally distracted at times, tangential responses   General Comments  incison with bandage intact - no additional drainage    Exercises     Shoulder Instructions      Home Living Family/patient expects to be discharged to:: Private residence Living Arrangements: Alone Available Help at  Discharge: Friend(s) Type of Home: House       Home Layout: One level     Bathroom Shower/Tub: Chief Strategy Officer: Handicapped height     Home Equipment: Environmental consultant - 2 wheels;Tub bench   Additional Comments: Pt reports she is able to get up and down from toilet easiliy - not hard for her.  Her church is helping with meals etc at [place]      Prior Functioning/Environment Level of Independence: Independent with assistive device(s)        Comments: Pt did limited cleaning, ordered food online and went and picked it up.        OT Problem List: Decreased activity tolerance;Impaired balance (sitting and/or standing);Decreased knowledge of use of DME or AE;Decreased knowledge of precautions;Pain      OT Treatment/Interventions: Self-care/ADL training;DME and/or AE instruction;Therapeutic activities;Patient/family education;Balance training    OT Goals(Current goals can be found in the care plan section) Acute Rehab OT Goals Patient Stated Goal: to be safe when i get home.  To be able to take care of my 3 cats OT Goal Formulation: With patient Time For Goal Achievement: 03/21/20 Potential to Achieve Goals: Good ADL Goals Pt Will Perform Lower Body Bathing: with modified independence;sit to/from stand Pt Will Perform Lower Body Dressing: with modified independence;sit to/from stand Pt Will Transfer to Toilet: with modified independence;ambulating Pt Will Perform Toileting - Clothing Manipulation and hygiene: with modified independence;sit to/from stand  OT Frequency: Min 2X/week   Barriers to D/C: Decreased caregiver support          Co-evaluation              AM-PAC OT "6 Clicks" Daily Activity     Outcome Measure Help from another person eating meals?: None Help from another person taking care of personal grooming?: None Help from another person toileting, which includes using toliet, bedpan, or urinal?: A Little Help from another person bathing  (including washing, rinsing, drying)?: A Little Help from another person to put on and taking off regular upper body clothing?: None Help from another person to put on and taking off regular lower body clothing?: A Little 6 Click Score: 21   End of Session Equipment Utilized During Treatment: Rolling walker;Back brace  Activity Tolerance: Patient tolerated treatment well Patient left: in bed;with call bell/phone within reach;with bed alarm set  OT Visit Diagnosis: Unsteadiness on feet (R26.81);Pain                Time: 4403-4742 OT Time Calculation (min): 12 min Charges:  OT General Charges $OT Visit: 1 Visit OT Evaluation $OT Eval Low Complexity: 1 Low  Raynald Kemp, OT Acute Rehabilitation Services Pager: [phone] Office: [phone]   Pilar Grammes 03/07/2020, 1:46 PM

## 2020-03-07 NOTE — Progress Notes (Signed)
Postop day 1.  Patient with significant incisional back pain.  No radicular pain.  No numbness or weakness.  Patient does not feel ready for discharge home today.  Awake and alert.  Oriented and appropriate.  Mildly hypertensive but not symptomatic from this.  Heart rate normal.  Urine output good.  Motor and sensory function of the extremities intact.  Chest and abdomen benign.  Overall progressing reasonably well following lumbar decompression and fusion surgery.  Continue efforts at mobilization and therapy.  Possible discharge tomorrow

## 2020-03-08 DIAGNOSIS — I10 Essential (primary) hypertension: Secondary | ICD-10-CM | POA: Diagnosis present

## 2020-03-08 DIAGNOSIS — Z9841 Cataract extraction status, right eye: Secondary | ICD-10-CM | POA: Diagnosis not present

## 2020-03-08 DIAGNOSIS — Z9049 Acquired absence of other specified parts of digestive tract: Secondary | ICD-10-CM | POA: Diagnosis not present

## 2020-03-08 DIAGNOSIS — M549 Dorsalgia, unspecified: Secondary | ICD-10-CM | POA: Diagnosis present

## 2020-03-08 DIAGNOSIS — M810 Age-related osteoporosis without current pathological fracture: Secondary | ICD-10-CM | POA: Diagnosis present

## 2020-03-08 DIAGNOSIS — M4186 Other forms of scoliosis, lumbar region: Secondary | ICD-10-CM | POA: Diagnosis present

## 2020-03-08 DIAGNOSIS — M1990 Unspecified osteoarthritis, unspecified site: Secondary | ICD-10-CM | POA: Diagnosis present

## 2020-03-08 DIAGNOSIS — Z880 Allergy status to penicillin: Secondary | ICD-10-CM | POA: Diagnosis not present

## 2020-03-08 DIAGNOSIS — E785 Hyperlipidemia, unspecified: Secondary | ICD-10-CM | POA: Diagnosis present

## 2020-03-08 DIAGNOSIS — Z981 Arthrodesis status: Secondary | ICD-10-CM | POA: Diagnosis not present

## 2020-03-08 DIAGNOSIS — Z881 Allergy status to other antibiotic agents status: Secondary | ICD-10-CM | POA: Diagnosis not present

## 2020-03-08 DIAGNOSIS — E7800 Pure hypercholesterolemia, unspecified: Secondary | ICD-10-CM | POA: Diagnosis present

## 2020-03-08 DIAGNOSIS — Z79899 Other long term (current) drug therapy: Secondary | ICD-10-CM | POA: Diagnosis not present

## 2020-03-08 DIAGNOSIS — Z7983 Long term (current) use of bisphosphonates: Secondary | ICD-10-CM | POA: Diagnosis not present

## 2020-03-08 DIAGNOSIS — M5126 Other intervertebral disc displacement, lumbar region: Secondary | ICD-10-CM | POA: Diagnosis present

## 2020-03-08 DIAGNOSIS — M79606 Pain in leg, unspecified: Secondary | ICD-10-CM | POA: Diagnosis present

## 2020-03-08 DIAGNOSIS — M48061 Spinal stenosis, lumbar region without neurogenic claudication: Secondary | ICD-10-CM | POA: Diagnosis present

## 2020-03-08 DIAGNOSIS — Z791 Long term (current) use of non-steroidal anti-inflammatories (NSAID): Secondary | ICD-10-CM | POA: Diagnosis not present

## 2020-03-08 DIAGNOSIS — J45909 Unspecified asthma, uncomplicated: Secondary | ICD-10-CM | POA: Diagnosis present

## 2020-03-08 DIAGNOSIS — Z20822 Contact with and (suspected) exposure to covid-19: Secondary | ICD-10-CM | POA: Diagnosis present

## 2020-03-08 DIAGNOSIS — Z9842 Cataract extraction status, left eye: Secondary | ICD-10-CM | POA: Diagnosis not present

## 2020-03-08 NOTE — Progress Notes (Signed)
Postop day 2.  Patient progressing relatively slowly.  Still with significant incisional pain and discomfort intermittent pain into her lower extremities.  Her mobility is marginal.  Certainly not ready for discharge.  Plans are being made for possible skilled nursing facility placement.  She is afebrile.  Her vital signs are stable.  Output is good.  She is awake and alert.  She is oriented and appropriate.  Motor and sensory function intact in both lower extremities.  Abdomen is Soft.  Dressing is dry.  Overall progressing well.  Continue efforts at mobilization and therapy.

## 2020-03-08 NOTE — Progress Notes (Signed)
Physical Therapy Treatment Patient Details Name: Brittany [name] MRN: [id number] DOB: Jul 09, 1946 Today's Date: 03/08/2020    History of Present Illness 74 year old with MRI or CT showed adjacent level stenosis with HNP L3-4.  Pt admitted for L3-4 PLIF.    PT Comments    Pts BP improved today and pt feeling much better.  She was able to walk 120 feet without RW with CGA.    She was much steadier today.  She still needs someone with her for safety but I talked to the nursing aide about having her not use the RW and do as much for herself as she can.  Pt has 4/10 pain when up moving around and says this is too much pain for her to go home alone and have to do all the things she has to do.  We reviewed back precautions and body mechanics and she still needs skilled care to focus on educating her so she will not have problems with her back in the future.  She is still planning on SNF DC to help her continue to progress before home alone.    Follow Up Recommendations  SNF;Supervision for mobility/OOB     Equipment Recommendations  None recommended by PT    Recommendations for Other Services       Precautions / Restrictions Precautions Precautions: Fall Precaution Comments: Pt reports she hasnt fallen since Dec - fell on her sidewalk Required Braces or Orthoses: Spinal Brace Spinal Brace: Lumbar corset;Applied in sitting position Restrictions Weight Bearing Restrictions: No Other Position/Activity Restrictions: Pt educatd on back precautions and able to verbalize    Mobility  Bed Mobility               General bed mobility comments: pt was sitting in bed with HOB elevated -s he remembered on her own to roll to her side to get up without cues or assist.  Will do from flat bed next visit  Transfers Overall transfer level: Needs assistance Equipment used: Rolling walker (2 wheeled);None Transfers: Sit to/from Stand Sit to Stand: Supervision;Min guard         General  transfer comment: Pt able to stand more easily today - reviewed back precautions with this  Ambulation/Gait Ambulation/Gait assistance: Min guard Gait Distance (Feet): 120 Feet Assistive device: None Gait Pattern/deviations: Trunk flexed;Decreased stride length     General Gait Details: Pt was walking around room - not depending on RW - it was getting in her way.  I had her walk in the halls with brace only - no AD.   she needed min guard for safety and cues for erect trunk/tight abds/ and education on cervical retraction which is hard for her.  Pt with foward head which pulls her forward - working on having her 'stack' up to have less strain on low back.  Pts big complaint ws her back pain - hurting 4/10 when up walking.   Stairs             Wheelchair Mobility    Modified Rankin (Stroke Patients Only)       Balance Overall balance assessment: Mild deficits observed, not formally tested                                          Cognition       Area of Impairment: Safety/judgement  Exercises      General Comments General comments (skin integrity, edema, etc.): Pt able to don brace independently      Pertinent Vitals/Pain Pain Assessment: 0-10 Pain Score: 4  Pain Location: back Pain Descriptors / Indicators: Burning;Aching;Discomfort Pain Intervention(s): Repositioned;Monitored during session    Home Living                      Prior Function            PT Goals (current goals can now be found in the care plan section) Progress towards PT goals: Progressing toward goals    Frequency    Min 5X/week      PT Plan      Co-evaluation              AM-PAC PT "6 Clicks" Mobility   Outcome Measure  Help needed turning from your back to your side while in a flat bed without using bedrails?: A Little Help needed moving from lying on your back to sitting on the side of a  flat bed without using bedrails?: A Little Help needed moving to and from a bed to a chair (including a wheelchair)?: A Little Help needed standing up from a chair using your arms (e.g., wheelchair or bedside chair)?: A Little Help needed to walk in hospital room?: A Little Help needed climbing 3-5 steps with a railing? : A Little 6 Click Score: 18    End of Session Equipment Utilized During Treatment: Gait belt;Back brace Activity Tolerance: Patient tolerated treatment well;No increased pain Patient left: in chair;with chair alarm set;with call bell/phone within reach Nurse Communication: Mobility status;Precautions;Other (comment) (talked to nursing tech about walking with pt several more times today)       Time: 9702-6378 PT Time Calculation (min) (ACUTE ONLY): 24 min  Charges:  $Gait Training: 23-37 mins                     03/08/2020   Ranae Palms, PT    Judson Roch 03/08/2020, 12:37 PM

## 2020-03-09 LAB — SARS CORONAVIRUS 2 BY RT PCR (HOSPITAL ORDER, PERFORMED IN ~~LOC~~ HOSPITAL LAB): SARS Coronavirus 2: NEGATIVE

## 2020-03-09 MED ORDER — OXYCODONE-ACETAMINOPHEN 5-325 MG PO TABS: 1.0000 | ORAL_TABLET | ORAL | 0 refills | Status: AC | PRN

## 2020-03-09 MED ORDER — METHOCARBAMOL 500 MG PO TABS
500.0000 mg | ORAL_TABLET | Freq: Four times a day (QID) | ORAL | 0 refills | Status: DC | PRN
Start: 2020-03-09 — End: 2020-03-09

## 2020-03-09 NOTE — Discharge Summary (Signed)
Physician Discharge Summary  Patient ID: Brittany [name] MRN: [id number] DOB/AGE: 74-Feb-1947 74 y.o.  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 The patient tolerated the procedure well and was taken to the recovery room and then to the floor in stable condition. The hospital course was routine. There were no complications. The wound remained clean dry and intact. Pt had appropriate back soreness. No complaints of leg pain or new N/T/W. The patient remained afebrile with stable vital signs, and tolerated a regular diet. The patient continued to increase activities, and pain was well controlled with oral pain medications.   Consults: None  Significant Diagnostic Studies:  Results for orders placed or performed during the hospital encounter of 03/06/20  Glucose, capillary  Result Value Ref Range   Glucose-Capillary 207 (H) 70 - 99 mg/dL   Comment 1 Notify RN    Comment 2 Document in Chart     Chest 2 View  Result Date: 03/03/2020 CLINICAL DATA:  74 year old female preoperative study for lumbar surgery. EXAM: CHEST - 2 VIEW COMPARISON:  Chest CT 01/14/2019. FINDINGS: Thoracolumbar scoliosis. Lung volumes and mediastinal contours are within normal limits. Visualized tracheal air column is within normal limits. No pneumothorax, pulmonary edema, pleural effusion or confluent pulmonary opacity. Chronic right lateral rib fractures. Partially visible previous cervical ACDF. Stable cholecystectomy clips. Negative visible bowel gas pattern. IMPRESSION: No acute cardiopulmonary abnormality. Electronically Signed   By: Odessa Fleming M.D.   On: 03/03/2020 15:56   DG Lumbar Spine 2-3 Views  Result Date: 03/06/2020 CLINICAL DATA:  L3-4 PLIF. EXAM: LUMBAR SPINE -  2-3 VIEW; DG C-ARM 1-60 MIN COMPARISON:  Radiograph 10/25/2019 FINDINGS: Two fluoroscopic spot views of the lumbar spine in the operating room in frontal and lateral projections. Extension of prior L4-L5 fusion to the L3-L4 level. New interbody spacer at L3-L4. Fluoroscopy time 34 seconds. Dose 27.93 mGy. IMPRESSION: Intraoperative fluoroscopy during lumbar fusion. Electronically Signed   By: Narda Rutherford M.D.   On: 03/06/2020 16:29   DG C-Arm 1-60 Min  Result Date: 03/06/2020 CLINICAL DATA:  L3-4 PLIF. EXAM: LUMBAR SPINE - 2-3 VIEW; DG C-ARM 1-60 MIN COMPARISON:  Radiograph 10/25/2019 FINDINGS: Two fluoroscopic spot views of the lumbar spine in the operating room in frontal and lateral projections. Extension of prior L4-L5 fusion to the L3-L4 level. New interbody spacer at L3-L4. Fluoroscopy time 34 seconds. Dose 27.93 mGy. IMPRESSION: Intraoperative fluoroscopy during lumbar fusion. Electronically Signed   By: Narda Rutherford M.D.   On: 03/06/2020 16:29    Antibiotics:  Anti-infectives (From admission, onward)   Start     Dose/Rate Route Frequency Ordered Stop   03/06/20 2230  vancomycin (VANCOCIN) IVPB 1000 mg/200 mL premix        1,000 mg 200 mL/hr over 60 Minutes Intravenous  Once 03/06/20 1749 03/06/20 2200   03/06/20 1329  bacitracin 50,000 Units in sodium chloride 0.9 % 500 mL irrigation  Status:  Discontinued          As needed 03/06/20 1330 03/06/20 1445   03/06/20 1015  vancomycin (VANCOCIN) IVPB 1000 mg/200 mL premix        1,000 mg 200 mL/hr over 60 Minutes Intravenous On call to O.R. 03/06/20 1004 03/06/20 1137      Discharge Exam: Blood pressure  110/61, pulse 73, temperature 97.6 F (36.4 C), temperature source Oral, resp. rate 18, height 5\' 3"  (1.6 m), weight 58.1 kg, SpO2 98 %. Neurologic: Grossly normal Dressing dry  Discharge Medications:   Allergies as of 03/09/2020      Reactions   Tetracyclines & Related Other (See Comments)   Throat ulcer   Penicillins     UNSPECIFIED REACTION  Has patient had a PCN reaction causing immediate rash, facial/tongue/throat swelling, SOB or lightheadedness with hypotension: No Has patient had a PCN reaction causing severe rash involving mucus membranes or skin necrosis: NO Has patient had a PCN reaction that required hospitalization No Has patient had a PCN reaction occurring within the last 10 years: No If all of the above answers are "NO", then may proceed with Cephalosporin       Medication List    STOP taking these medications   naproxen sodium 220 MG tablet Commonly known as: ALEVE     TAKE these medications   acetaminophen 500 MG tablet Commonly known as: TYLENOL Take 500 mg by mouth every 6 (six) hours as needed (for pain. (max 6 tablets/24hrs.)).   alendronate 70 MG tablet Commonly known as: FOSAMAX Take 70 mg by mouth every Wednesday. Take with a full glass of water on an empty stomach.   clonazePAM 1 MG tablet Commonly known as: KLONOPIN Take 0.5-1 mg by mouth See admin instructions. Take 1 tablet (1 mg) by mouth at bedtime & take 0.5 tablet (0.5 mg) by mouth at 0500 in the morning.   docusate sodium 100 MG capsule Commonly known as: COLACE Take 200 mg by mouth daily.   Fish Oil 1200 MG Caps Take 1,200 mg by mouth daily in the afternoon.   FLUoxetine 40 MG capsule Commonly known as: PROZAC Take 40 mg by mouth daily.   loratadine 10 MG tablet Commonly known as: CLARITIN Take 10 mg by mouth daily.   Lubricant Eye Drops 0.4-0.3 % Soln Generic drug: Polyethyl Glycol-Propyl Glycol Place 1 drop into both eyes daily. (Dry/irritated eyes)   MAGNESIA PO Take 1 tablet by mouth daily.   oxyCODONE-acetaminophen 5-325 MG tablet Commonly known as: Percocet Take 1 tablet by mouth every 4 (four) hours as needed for severe pain.   polyethylene glycol 17 g packet Commonly known as: MIRALAX / GLYCOLAX Take 17 g by mouth daily in the afternoon.   simvastatin 40 MG tablet Commonly known as:  ZOCOR Take 40 mg by mouth daily in the afternoon.   tiZANidine 4 MG tablet Commonly known as: ZANAFLEX Take 4 mg by mouth See admin instructions. Take 1 tablet (4 mg) by mouth at bedtime & take 1 tablet (4 mg) by mouth at 0500 in the morning.   TUMS ULTRA 1000 PO Take 1,000 mg by mouth daily in the afternoon.   Vitamin D3 50 MCG (2000 UT) Tabs Take 2,000 Units by mouth daily in the afternoon.            Durable Medical Equipment  (From admission, onward)         Start     Ordered   03/06/20 1658  DME Walker rolling  Once       Question:  Patient needs a walker to treat with the following condition  Answer:  S/P lumbar fusion   03/06/20 1657   03/06/20 1658  DME 3 n 1  Once        03/06/20 1657           Discharge  Care Instructions  (From admission, onward)         Start     Ordered   03/09/20 0000  No dressing needed        03/09/20 1244          Disposition: snf   Final Dx: PLIF L3-4  Discharge Instructions    Call MD for:  difficulty breathing, headache or visual disturbances   Complete by: As directed    Call MD for:  extreme fatigue   Complete by: As directed    Call MD for:  hives   Complete by: As directed    Call MD for:  persistant dizziness or light-headedness   Complete by: As directed    Call MD for:  persistant nausea and vomiting   Complete by: As directed    Call MD for:  redness, tenderness, or signs of infection (pain, swelling, redness, odor or green/yellow discharge around incision site)   Complete by: As directed    Call MD for:  severe uncontrolled pain   Complete by: As directed    Diet - low sodium heart healthy   Complete by: As directed    Increase activity slowly   Complete by: As directed    No dressing needed   Complete by: As directed        Follow-up Information    Tia Alert, MD. Schedule an appointment as soon as possible for a visit in 3 week(s).   Specialty: Neurosurgery Contact information: [address] [place] [address] [phone]                Signed: Tia Alert 03/09/2020, 12:50 PM

## 2020-03-09 NOTE — TOC Transition Note (Signed)
Transition of Care [facility]) - CM/SW Discharge Note   Patient Details  Name: Brittany [name] MRN: [id number] Date of Birth: [birth year]  [facility] of [facility] CM/SW Contact:  Baldemar Lenis, LCSW Phone Number: 03/09/2020, 3:05 PM   Clinical Narrative:   Nurse to call report to [phone], Room 105     Final next level of care: Skilled Nursing Facility Barriers to Discharge: Barriers Resolved   Patient Goals and CMS Choice Patient states their goals for this hospitalization and ongoing recovery are:: to get strong enough to return home CMS Medicare.gov Compare Post Acute Care list provided to:: Patient Choice offered to / list presented to : Patient  Discharge Placement              Patient chooses bed at: [facility] Patient to be transferred to facility by: Family Name of family member notified: Self Patient and family notified of of transfer: 03/09/20  Discharge Plan and Services In-house Referral: Clinical Social Work   Post Acute Care Choice: Skilled Nursing Facility                               Social Determinants of Health (SDOH) Interventions     Readmission Risk Interventions No flowsheet data found.

## 2020-03-09 NOTE — Progress Notes (Signed)
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  Pt admitted for L3-4 PLIF.    PT Comments    Overall pt requiring min assist for balance during gait (drifting rt and lt and imbalance during turns). She required education again regarding proper use of brace (when to wear and donning/doffing) and to adhere to her back precautions. Incr time spent on education while mobilizing.     Follow Up Recommendations  SNF;Supervision for mobility/OOB     Equipment Recommendations  None recommended by PT    Recommendations for Other Services       Precautions / Restrictions Precautions Precautions: Back;Fall Precaution Comments: Only able to state 2 of 3 precautions (forgot no lifting). Pt required verbal and tactile cues to avoid bending forward. Pt reports she hasnt fallen since Dec - fell on her sidewalk Required Braces or Orthoses: Spinal Brace Spinal Brace: Lumbar corset;Applied in standing position (Pt was up in room without donned)    Mobility  Bed Mobility Overal bed mobility: Needs Assistance Bed Mobility: Sit to Sidelying;Rolling Rolling: Modified independent (Device/Increase time)       Sit to sidelying: Supervision General bed mobility comments: up in room on arrival; states she called for assist to come out of bathroom, however call light was not on; began to try to get into bed with HOB and FOB elevated, required assist to flatten bed prior to getting back to bed (Note: she had gotten OOB with bed in this position)  Transfers Overall transfer level: Needs assistance Equipment used: None Transfers: Sit to/from Stand Sit to Stand: Min guard         General transfer comment: Sit to stand x 3 for gown adjustment and RN in to see lumbar dressing  Ambulation/Gait Ambulation/Gait assistance:  Min assist Gait Distance (Feet): 160 Feet Assistive device: None Gait Pattern/deviations: Decreased stride length;Drifts right/left Gait velocity: decr   General Gait Details: Pt was walking around room without assist on arrival--no brace on, no device. Walked in the halls with brace only - no AD.   she needed min assist for balance due to right and left drifting (especailly with head turns or 180 degree turn) also for erect trunk/tight abds/ and education on cervical retraction which is hard for her.  Pt with foward head which pulls her forward - working on having her 'stack' up to have less strain on low back.  Pts big complaint ws her back pain - hurting 4/10 when up walking.   Stairs             Wheelchair Mobility    Modified Rankin (Stroke Patients Only)       Balance Overall balance assessment: Mild deficits observed, not formally tested                                          Cognition Arousal/Alertness: Awake/alert Behavior During Therapy: Anxious (tearful) Overall Cognitive Status: No family/caregiver present to determine baseline cognitive functioning Area of Impairment: Safety/judgement;Awareness;Problem solving                         Safety/Judgement: Decreased awareness of safety;Decreased awareness of deficits Awareness: Intellectual Problem Solving: Slow processing;Requires verbal cues;Requires tactile  cues (donning and doffing brace (re: tightening strings before don) General Comments: internally distracted at times      Exercises      General Comments General comments (skin integrity, edema, etc.): Pt reports poor sleep last night due to everything on her mind and is "just exhausted." Reiterated importance of calling for assistance, back precautions, and wearing brace.       Pertinent Vitals/Pain Pain Assessment: 0-10 Pain Score: 4  (tearful, yet reports it's emotional not due to pain) Pain Location: back Pain  Descriptors / Indicators: Aching Pain Intervention(s): Limited activity within patient's tolerance;Monitored during session;Repositioned;Patient requesting pain meds-RN notified;RN gave pain meds during session    Home Living                      Prior Function            PT Goals (current goals can now be found in the care plan section) Acute Rehab PT Goals Patient Stated Goal: to be safe when i get home.  To be able to take care of my 3 cats Time For Goal Achievement: 03/13/20 Potential to Achieve Goals: Good Progress towards PT goals: Progressing toward goals    Frequency    Min 5X/week      PT Plan Current plan remains appropriate    Co-evaluation              AM-PAC PT "6 Clicks" Mobility   Outcome Measure  Help needed turning from your back to your side while in a flat bed without using bedrails?: None Help needed moving from lying on your back to sitting on the side of a flat bed without using bedrails?: A Little Help needed moving to and from a bed to a chair (including a wheelchair)?: A Little Help needed standing up from a chair using your arms (e.g., wheelchair or bedside chair)?: A Little Help needed to walk in hospital room?: A Little Help needed climbing 3-5 steps with a railing? : A Little 6 Click Score: 19    End of Session Equipment Utilized During Treatment: Gait belt;Back brace Activity Tolerance: Patient tolerated treatment well;No increased pain Patient left: with call bell/phone within reach;in bed;with bed alarm set;with nursing/sitter in room Nurse Communication: Mobility status;Other (comment);Patient requests pain meds (found up in room alone) PT Visit Diagnosis: Unsteadiness on feet (R26.81);Muscle weakness (generalized) (M62.81);Pain Pain - Right/Left:  (midline) Pain - part of body:  (back)     Time: 0109-3235 PT Time Calculation (min) (ACUTE ONLY): 21 min  Charges:  $Self Care/Home Management: 8-22                       Jerolyn Center, PT Pager [phone]    Zena Amos 03/09/2020, 9:29 AM

## 2020-03-09 NOTE — Progress Notes (Signed)
Patient ID: Brittany [name], female   DOB: Jun 03, 1946, 74 y.o.   MRN: [id number] Subjective: Patient reports bad night, pain is moderate  Objective: Vital signs in last 24 hours: Temp:  [97.9 F (36.6 C)-99 F (37.2 C)] 97.9 F (36.6 C) (08/30 0725) Pulse Rate:  [67-82] 68 (08/30 0725) Resp:  [18-20] 18 (08/30 0725) BP: (101-125)/(53-71) 123/71 (08/30 0725) SpO2:  [94 %-98 %] 98 % (08/30 0725)  Intake/Output from previous day: 08/29 0701 - 08/30 0700 In: 1780 [P.O.:580; I.V.:1200] Out: -  Intake/Output this shift: Total I/O In: 360 [P.O.:360] Out: -   Neurologic: Grossly normal  Lab Results: Lab Results  Component Value Date   WBC 5.5 03/03/2020   HGB 13.4 03/03/2020   HCT 42.7 03/03/2020   MCV 97.3 03/03/2020   PLT 194 03/03/2020   Lab Results  Component Value Date   INR 1.0 03/03/2020   BMET Lab Results  Component Value Date   NA 139 03/03/2020   K 3.6 03/03/2020   CL 104 03/03/2020   CO2 25 03/03/2020   GLUCOSE 84 03/03/2020   BUN 14 03/03/2020   CREATININE 0.85 03/03/2020   CALCIUM 9.7 03/03/2020    Studies/Results: No results found.  Assessment/Plan: Awaiting snf, mobilize  Estimated body mass index is 22.67 kg/m as calculated from the following:   Height as of this encounter: 5\' 3"  (1.6 m).   Weight as of this encounter: 58.1 kg.    LOS: 1 day    03/09/2020, 9:24 AM

## 2020-03-09 NOTE — Progress Notes (Signed)
Attempted to call report to [phone], no answer.

## 2020-03-10 NOTE — Anesthesia Postprocedure Evaluation (Signed)
Anesthesia Post Note  Patient: Brittany [name]  Procedure(s) Performed: LUMBAR THREE-FOUR POSTERIOR LUMBAR INTERBODY FUSION (N/A Spine Lumbar)     Patient location during evaluation: PACU Anesthesia Type: General Level of consciousness: sedated and patient cooperative Pain management: pain level controlled Vital Signs Assessment: post-procedure vital signs reviewed and stable Respiratory status: spontaneous breathing Cardiovascular status: stable Anesthetic complications: no   No complications documented.  Last Vitals:  Vitals:   03/09/20 0725 03/09/20 1215  BP: 123/71 110/61  Pulse: 68 73  Resp: 18 18  Temp: 36.6 C 36.4 C  SpO2: 98% 98%    Last Pain:  Vitals:   03/09/20 1215  TempSrc: Oral  PainSc:                  Lewie Loron

## 2020-07-21 DIAGNOSIS — M48061 Spinal stenosis, lumbar region without neurogenic claudication: Secondary | ICD-10-CM | POA: Diagnosis not present

## 2020-09-25 DIAGNOSIS — H26491 Other secondary cataract, right eye: Secondary | ICD-10-CM | POA: Diagnosis not present

## 2020-09-25 DIAGNOSIS — H43813 Vitreous degeneration, bilateral: Secondary | ICD-10-CM | POA: Diagnosis not present

## 2020-09-25 DIAGNOSIS — H40013 Open angle with borderline findings, low risk, bilateral: Secondary | ICD-10-CM | POA: Diagnosis not present

## 2020-09-25 DIAGNOSIS — H524 Presbyopia: Secondary | ICD-10-CM | POA: Diagnosis not present

## 2020-10-13 DIAGNOSIS — K5900 Constipation, unspecified: Secondary | ICD-10-CM | POA: Diagnosis not present

## 2020-10-13 DIAGNOSIS — M150 Primary generalized (osteo)arthritis: Secondary | ICD-10-CM | POA: Diagnosis not present

## 2020-10-13 DIAGNOSIS — M4800 Spinal stenosis, site unspecified: Secondary | ICD-10-CM | POA: Diagnosis not present

## 2020-10-13 DIAGNOSIS — G4700 Insomnia, unspecified: Secondary | ICD-10-CM | POA: Diagnosis not present

## 2020-11-06 DIAGNOSIS — M810 Age-related osteoporosis without current pathological fracture: Secondary | ICD-10-CM | POA: Diagnosis not present

## 2020-11-06 DIAGNOSIS — I700 Atherosclerosis of aorta: Secondary | ICD-10-CM | POA: Diagnosis not present

## 2020-11-06 DIAGNOSIS — M4800 Spinal stenosis, site unspecified: Secondary | ICD-10-CM | POA: Diagnosis not present

## 2020-11-06 DIAGNOSIS — Z0000 Encounter for general adult medical examination without abnormal findings: Secondary | ICD-10-CM | POA: Diagnosis not present

## 2020-11-06 DIAGNOSIS — M150 Primary generalized (osteo)arthritis: Secondary | ICD-10-CM | POA: Diagnosis not present

## 2020-11-06 DIAGNOSIS — I7300 Raynaud's syndrome without gangrene: Secondary | ICD-10-CM | POA: Diagnosis not present

## 2020-11-06 DIAGNOSIS — E782 Mixed hyperlipidemia: Secondary | ICD-10-CM | POA: Diagnosis not present

## 2020-11-06 DIAGNOSIS — G4700 Insomnia, unspecified: Secondary | ICD-10-CM | POA: Diagnosis not present

## 2020-11-06 DIAGNOSIS — K5900 Constipation, unspecified: Secondary | ICD-10-CM | POA: Diagnosis not present

## 2020-11-10 DIAGNOSIS — M5442 Lumbago with sciatica, left side: Secondary | ICD-10-CM | POA: Diagnosis not present

## 2020-11-10 DIAGNOSIS — M5416 Radiculopathy, lumbar region: Secondary | ICD-10-CM | POA: Diagnosis not present

## 2020-11-11 DIAGNOSIS — Z1231 Encounter for screening mammogram for malignant neoplasm of breast: Secondary | ICD-10-CM | POA: Diagnosis not present

## 2020-12-10 DIAGNOSIS — M5416 Radiculopathy, lumbar region: Secondary | ICD-10-CM | POA: Diagnosis not present

## 2020-12-24 ENCOUNTER — Other Ambulatory Visit: Payer: Self-pay

## 2020-12-24 ENCOUNTER — Ambulatory Visit: Payer: Medicare Other | Attending: Student | Admitting: Physical Therapy

## 2020-12-24 DIAGNOSIS — M5442 Lumbago with sciatica, left side: Secondary | ICD-10-CM | POA: Diagnosis not present

## 2020-12-24 DIAGNOSIS — G8929 Other chronic pain: Secondary | ICD-10-CM | POA: Insufficient documentation

## 2020-12-24 DIAGNOSIS — R262 Difficulty in walking, not elsewhere classified: Secondary | ICD-10-CM | POA: Insufficient documentation

## 2020-12-24 DIAGNOSIS — M6281 Muscle weakness (generalized): Secondary | ICD-10-CM | POA: Insufficient documentation

## 2020-12-24 NOTE — Patient Instructions (Signed)
Access Code: [email or web address] URL: [email or web address] Date: 12/24/2020 Prepared by: Vernon Prey April Kirstie Peri  Exercises Hip Abduction with Resistance Loop - 1 x daily - 7 x weekly - 2 sets - 10 reps Hip Extension with Resistance Loop - 1 x daily - 7 x weekly - 2 sets - 10 reps Standing Hip Flexion with Resistance Loop - 1 x daily - 7 x weekly - 2 sets - 10 reps

## 2020-12-24 NOTE — Therapy (Signed)
[facility] [address] [place], [place], [address] Phone: [phone]   Fax:  [phone]  Physical Therapy Evaluation  Patient Details  Name: Brittany [name] MRN: [id number] Date of Birth: [birth year] Referring Provider (PT): Meyran, Tiana Loft, NP   Encounter Date: 12/24/2020   PT End of Session - 12/24/20 1425     Visit Number 1    Number of Visits 13    Date for PT Re-Evaluation 02/04/21    Authorization Type UHC - Medicare    Progress Note Due on Visit 10    PT Start Time 1425    PT Stop Time 1510    PT Time Calculation (min) 45 min    Activity Tolerance Patient tolerated treatment well    Behavior During Therapy WFL for tasks assessed/performed             Past Medical History:  Diagnosis Date   Anemia    h/o   Anxiety    Arthritis    Asthma    as child  no problem since   Degenerative spondylolisthesis    L4-5, grade 1   Depression    Headache    hx migraines, tension headaches   HLD (hyperlipidemia)    Hypercholesteremia    Left lumbar radiculopathy    Lumbago    Lumbar degenerative disc disease    Lumbar spondylosis    Lumbar stenosis with neurogenic claudication    L4-5   OCD (obsessive compulsive disorder)    Osteoporosis    Post-nasal drip    Slow transit constipation    Spinal stenosis    Vitamin D deficiency    Weakness of both lower extremities     Past Surgical History:  Procedure Laterality Date   adnoids     ANTERIOR CERVICAL DECOMP/DISCECTOMY FUSION N/A 03/22/2018   Procedure: ANTERIOR CERVICAL DECOMPRESSION/DISCECTOMY FUSION CERVICAL FOUR - CERVICAL FIVE, CERVICAL FIVE - CERVICAL SIX, CERVICAL SIX- CERVICAL SEVEN;  Surgeon: Shirlean Kelly, MD;  Location: [facility] OR;  Service: Neurosurgery;  Laterality: N/A;  ANTERIOR CERVICAL DECOMPRESSION/DISCECTOMY FUSION CERVICAL FOUR - CERVICAL FIVE, CERVICAL FIVE - CERVICAL SIX, CERVICAL SIX- CERVICAL SEVEN   BACK SURGERY  2017    CHOLECYSTECTOMY N/A 08/15/2017   Procedure: LAPAROSCOPIC CHOLECYSTECTOMY;  Surgeon: Abigail Miyamoto, MD;  Location: [facility] OR;  Service: General;  Laterality: N/A;   EYE SURGERY     bil cataract   TONSILLECTOMY     t+a    There were no vitals filed for this visit.    Subjective Assessment - 12/24/20 1427     Subjective Pt with history of spinal stenosis with 3 surgeries (neck and back). Neck was most successful surgery. LBP is feeling weak -- legs feel weak. Last surgery late Aug 2021 for her back (L3-L4). Pt has had increased pain. Pt reports she feels she might have aggravated it too much with bending in the Spring which decreased her activity. She has returned to walking a part of a block (~15 minutes); previously walking more than that. Two biggest reasons for pain: muscle tension (more lower than upper back) and damaged nerve on L side. Can occasionally sting and cause N/T with mild muscle contractions into L foot (mostly in front lateral thigh). Reports sometimes her body can feel that it's tying itself in knots. Feels that her nerve pain and muscle tightness can be caused by weather changes. Has had steroid shot in back.    How long can you sit comfortably? n/a  How long can you stand comfortably? Unable to tolerate standing to do dishes + moving arms    How long can you walk comfortably? ~15 minutes (a part of a block) before aggravation    Patient Stated Goals Decrease pain (and sustain), improve ability to perform house work; return to more active life style (i.e. hiking); travel (take vacation, go to [employer])    Currently in Pain? Yes    Pain Score 3    5/10 at most   Pain Orientation Lower    Pain Descriptors / Indicators Aching;Throbbing    Pain Type Chronic pain    Pain Radiating Towards Anterior L lateral thigh    Pain Onset More than a month ago    Pain Frequency Constant    Aggravating Factors  Increased time standing, walking, weather    Pain Relieving Factors Cold  pack for nerve, hot pack for muscles, muscle relaxers    Effect of Pain on Daily Activities difficulty walking, difficulty with ADLs and IADLs                [facility] PT Assessment - 12/24/20 0001       Assessment   Medical Diagnosis M54.16 (ICD-10-CM) - Radiculopathy, lumbar region    Referring Provider (PT) Meyran, Tiana Loft, NP    Onset Date/Surgical Date --   Aug 2021   Prior Therapy Before second surgery      Precautions   Precautions None    Precaution Comments Has been wearing back brace less; explicitly told no house work but otherwise no other restrictions      Restrictions   Weight Bearing Restrictions No      Balance Screen   Has the patient fallen in the past 6 months No      Home Environment   Living Environment Private residence    Living Arrangements Alone    Type of Home House    Home Access Level entry    Home Layout One level      Prior Function   Level of Independence Independent    Vocation Retired    Librarian, academic, travel      Cognition   Overall Cognitive Status Within Functional Limits for tasks assessed      Observation/Other Assessments   Focus on Therapeutic Outcomes (FOTO)  38, predicted 52      ROM / Strength   AROM / PROM / Strength AROM;Strength      AROM   AROM Assessment Site Lumbar    Lumbar Flexion 50%    Lumbar Extension --   Nervous to try   Lumbar - Right Side Bend 2" above knee   Feels more nerve pain   Lumbar - Left Side Bend 2" above knee   less on left   Lumbar - Right Rotation 25%    Lumbar - Left Rotation 25%      Strength   Strength Assessment Site Hip;Knee    Right/Left Hip Right;Left    Right Hip Flexion 4/5    Right Hip Extension 3/5    Right Hip External Rotation  3+/5    Right Hip Internal Rotation 3+/5    Right Hip ABduction 4-/5    Left Hip Flexion 4/5    Left Hip Extension 3+/5    Left Hip External Rotation 3+/5    Left Hip Internal Rotation 3+/5    Left Hip ABduction 3+/5    Right/Left  Knee Left;Right    Right Knee Flexion 4+/5  Right Knee Extension 4+/5    Left Knee Flexion 4+/5    Left Knee Extension 4/5      Palpation   Palpation comment TTP paraspinals L>R and L piriformis      Special Tests    Special Tests Lumbar    Lumbar Tests FABER test;Slump Test;Prone Knee Bend Test;Straight Leg Raise      FABER test   findings Negative    Comment L LE feels in hip      Slump test   Findings Negative      Prone Knee Bend Test   Findings Negative      Straight Leg Raise   Findings Positive    Side  Left    Comment Feels slight pull in back ~60 deg                        Objective measurements completed on examination: See above findings.                 PT Short Term Goals - 12/24/20 1532       PT SHORT TERM GOAL #1   Title be independent in initial HEP    Time 3    Period Weeks    Status New    Target Date 01/14/21      PT SHORT TERM GOAL #2   Title Pt will be able to tolerate 5x STS in <13 sec    Time 3    Period Weeks    Status New    Target Date 01/14/21      PT SHORT TERM GOAL #3   Title Pt will report decrease in back pain by 25% with standing activities    Time 3    Period Weeks    Status New    Target Date 01/14/21      PT SHORT TERM GOAL #4   Title Pt will be able to amb 10-15 min twice a day to increase her activity level    Time 3    Period Weeks    Status New    Target Date 01/14/21               PT Long Term Goals - 12/24/20 1540       PT LONG TERM GOAL #1   Title be independent in advanced HEP    Time 6    Period Weeks    Status New    Target Date 02/04/21      PT LONG TERM GOAL #2   Title Pt will be able to amb a full block back/forth per her personal walking goals    Time 6    Period Weeks    Status New    Target Date 02/04/21      PT LONG TERM GOAL #3   Title Pt would like to be able to hike an easy trail ([place]e. [address])    Time 6    Period Weeks    Status New     Target Date 02/04/21      PT LONG TERM GOAL #4   Title Pt will have improved FOTO score to 52    Time 6    Period Weeks    Status New    Target Date 02/04/21                    Plan - 12/24/20 1508     Clinical Impression Statement Ms.  Brittany [name] is a 75 y/o F presenting to OPPT due to complaint of chronic back pain. PMH significant for scoliosis, cervical and lumbar fusion x2 (most recent surgery Aug 2021). Pt currently restricted from house work but has not had a recent follow-up with her surgeon to lift these restrictions. She has been cleared from her back brace and precautions; however, has been wary and fearful of increasing lumbar motion due to exacerbating her back pain in Spring 2022. On assessment, pt demos decreased lumbar motion due to pain and fear, generalized bilat LE weakness, limited knee ROM, and s/s of L4-L5 nerve impingement. Pt would benefit from PT to improve her pain to increase her general mobility and reach her goals.    Personal Factors and Comorbidities Age;Fitness;Time since onset of injury/illness/exacerbation;Comorbidity 1    Comorbidities Anemia, anxiety, arthritis, asthma, depression, HLD, hypercholesterolemia, osteoporosis    Examination-Activity Limitations Locomotion Level;Squat;Stand;Lift;Bend    Examination-Participation Restrictions Cleaning;Community Activity;Shop;Yard Work    Conservation officer, historic buildings Evolving/Moderate complexity    Clinical Decision Making Moderate    Rehab Potential Good    PT Frequency 2x / week    PT Duration 6 weeks    PT Treatment/Interventions ADLs/Self Care Home Management;Cryotherapy;Electrical Stimulation;Iontophoresis 4mg /ml Dexamethasone;Moist Heat;Ultrasound;Gait training;Stair training;Functional mobility training;Therapeutic activities;Therapeutic exercise;Balance training;Neuromuscular re-education;Patient/family education;Manual techniques;Passive range of motion;Dry needling;Taping    PT Next  Visit Plan Assess response to HEP. Assess 5x STS. Continue hip strengthening. Initiate core stabilization.    PT Home Exercise Plan Access Code: [email or web address]    Consulted and Agree with Plan of Care Patient             Patient will benefit from skilled therapeutic intervention in order to improve the following deficits and impairments:  Abnormal gait, Decreased range of motion, Difficulty walking, Increased fascial restricitons, Decreased endurance, Increased muscle spasms, Decreased activity tolerance, Pain, Impaired perceived functional ability, Improper body mechanics, Decreased mobility, Decreased strength, Impaired sensation, Postural dysfunction  Visit Diagnosis: Muscle weakness (generalized)  Chronic bilateral low back pain with left-sided sciatica  Difficulty in walking, not elsewhere classified     Problem List Patient Active Problem List   Diagnosis Date Noted   Degenerative spondylolisthesis 03/07/2020   S/P lumbar fusion 03/06/2020   HNP (herniated nucleus pulposus), cervical 03/22/2018   S/P laparoscopic cholecystectomy 08/15/2017   Lumbar stenosis with neurogenic claudication 10/05/2015    [facility] April May Ariah Mower PT, DPT 12/24/2020, 3:44 PM  [facility] [address] [place], [place], [place] Phone: [phone]   Fax:  [phone]  Name: Bernisha Verma Gillham MRN: Renee Pain Date of Birth: 10-07-1945

## 2020-12-28 ENCOUNTER — Ambulatory Visit: Payer: Medicare Other

## 2020-12-28 ENCOUNTER — Other Ambulatory Visit: Payer: Self-pay

## 2020-12-28 DIAGNOSIS — R262 Difficulty in walking, not elsewhere classified: Secondary | ICD-10-CM | POA: Diagnosis not present

## 2020-12-28 DIAGNOSIS — G8929 Other chronic pain: Secondary | ICD-10-CM | POA: Diagnosis not present

## 2020-12-28 DIAGNOSIS — M6250 Muscle wasting and atrophy, not elsewhere classified, unspecified site: Secondary | ICD-10-CM | POA: Diagnosis not present

## 2020-12-28 DIAGNOSIS — M5442 Lumbago with sciatica, left side: Secondary | ICD-10-CM | POA: Diagnosis not present

## 2020-12-28 DIAGNOSIS — M6281 Muscle weakness (generalized): Secondary | ICD-10-CM

## 2020-12-28 NOTE — Therapy (Signed)
[facility] [address] [place], [place], [address] Phone: [phone]   Fax:  [phone]  Physical Therapy Treatment  Patient Details  Name: Brittany [name] MRN: [id number] Date of Birth: Oct 11, 1945 Referring Provider (PT): Meyran, Tiana Loft, NP   Encounter Date: 12/28/2020   PT End of Session - 12/28/20 1049     Visit Number 2    Number of Visits 13    Date for PT Re-Evaluation 02/04/21    Authorization Type UHC - Medicare    Progress Note Due on Visit 10    PT Start Time 1000    PT Stop Time 1045    PT Time Calculation (min) 45 min    Activity Tolerance Patient tolerated treatment well    Behavior During Therapy WFL for tasks assessed/performed             Past Medical History:  Diagnosis Date   Anemia    h/o   Anxiety    Arthritis    Asthma    as child  no problem since   Degenerative spondylolisthesis    L4-5, grade 1   Depression    Headache    hx migraines, tension headaches   HLD (hyperlipidemia)    Hypercholesteremia    Left lumbar radiculopathy    Lumbago    Lumbar degenerative disc disease    Lumbar spondylosis    Lumbar stenosis with neurogenic claudication    L4-5   OCD (obsessive compulsive disorder)    Osteoporosis    Post-nasal drip    Slow transit constipation    Spinal stenosis    Vitamin D deficiency    Weakness of both lower extremities     Past Surgical History:  Procedure Laterality Date   adnoids     ANTERIOR CERVICAL DECOMP/DISCECTOMY FUSION N/A 03/22/2018   Procedure: ANTERIOR CERVICAL DECOMPRESSION/DISCECTOMY FUSION CERVICAL FOUR - CERVICAL FIVE, CERVICAL FIVE - CERVICAL SIX, CERVICAL SIX- CERVICAL SEVEN;  Surgeon: Shirlean Kelly, MD;  Location: [facility] OR;  Service: Neurosurgery;  Laterality: N/A;  ANTERIOR CERVICAL DECOMPRESSION/DISCECTOMY FUSION CERVICAL FOUR - CERVICAL FIVE, CERVICAL FIVE - CERVICAL SIX, CERVICAL SIX- CERVICAL SEVEN   BACK SURGERY  2017    CHOLECYSTECTOMY N/A 08/15/2017   Procedure: LAPAROSCOPIC CHOLECYSTECTOMY;  Surgeon: Abigail Miyamoto, MD;  Location: [facility] OR;  Service: General;  Laterality: N/A;   EYE SURGERY     bil cataract   TONSILLECTOMY     t+a    There were no vitals filed for this visit.   Subjective Assessment - 12/28/20 1002     Subjective Pt reports being adherent to her HEP. She reports not noticing any symptom changes since her last appointment, although she reports feeling like she may be walking faster on her daily walks. She states she is walking to the end of her block and back.    Currently in Pain? Yes    Pain Score 1     Pain Location Back    Pain Orientation Lower    Pain Descriptors / Indicators Other (Comment)   Stiff   Pain Type Chronic pain                               [facility] Adult PT Treatment/Exercise - 12/28/20 0001       Lumbar Exercises: Standing   Other Standing Lumbar Exercises Pallof press at cable column 3# 2x10 BIL      Lumbar Exercises: Supine  Large Ball Abdominal Isometric --   3x30sec with red physioball     Manual Therapy   Other Manual Therapy Manual lumbar distraction 3x68min                    PT Education - 12/28/20 1049     Education Details Pt educated on proper form when performing her newly added exercises, as well as importance of regular physical activity.    Person(s) Educated Patient    Methods Explanation;Handout;Demonstration;Tactile cues;Verbal cues    Comprehension Verbalized understanding;Verbal cues required;Tactile cues required;Returned demonstration              PT Short Term Goals - 12/24/20 1532       PT SHORT TERM GOAL #1   Title be independent in initial HEP    Time 3    Period Weeks    Status New    Target Date 01/14/21      PT SHORT TERM GOAL #2   Title Pt will be able to tolerate 5x STS in <13 sec    Time 3    Period Weeks    Status New    Target Date 01/14/21      PT SHORT TERM GOAL #3    Title Pt will report decrease in back pain by 25% with standing activities    Time 3    Period Weeks    Status New    Target Date 01/14/21      PT SHORT TERM GOAL #4   Title Pt will be able to amb 10-15 min twice a day to increase her activity level    Time 3    Period Weeks    Status New    Target Date 01/14/21               PT Long Term Goals - 12/24/20 1540       PT LONG TERM GOAL #1   Title be independent in advanced HEP    Time 6    Period Weeks    Status New    Target Date 02/04/21      PT LONG TERM GOAL #2   Title Pt will be able to amb a full block back/forth per her personal walking goals    Time 6    Period Weeks    Status New    Target Date 02/04/21      PT LONG TERM GOAL #3   Title Pt would like to be able to hike an easy trail ([place]. [address])    Time 6    Period Weeks    Status New    Target Date 02/04/21      PT LONG TERM GOAL #4   Title Pt will have improved FOTO score to 52    Time 6    Period Weeks    Status New    Target Date 02/04/21                   Plan - 12/28/20 1233     Clinical Impression Statement The pt responded well to all treatment today, demonstrating proper form with newly added exercises with no increase in pain. She reports decrease in pain with a "pleasant stretch" during manual lumbar distraction, although she reports mild sensitization into her L LE following the treatment. A trial of mechanical lumbar traction was discussed with the pt, to which she agreed. She will continue to benefit from skilled PT to address  her primary impairments and to help her return to her prior level of function without limitation.    Personal Factors and Comorbidities Age;Fitness;Time since onset of injury/illness/exacerbation;Comorbidity 1    Comorbidities Anemia, anxiety, arthritis, asthma, depression, HLD, hypercholesterolemia, osteoporosis    Examination-Activity Limitations Locomotion Level;Squat;Stand;Lift;Bend     Examination-Participation Restrictions Cleaning;Community Activity;Shop;Yard Work    Conservation officer, historic buildings Evolving/Moderate complexity    Clinical Decision Making Moderate    Rehab Potential Good    PT Frequency 2x / week    PT Duration 6 weeks    PT Treatment/Interventions ADLs/Self Care Home Management;Cryotherapy;Electrical Stimulation;Iontophoresis 4mg /ml Dexamethasone;Moist Heat;Ultrasound;Gait training;Stair training;Functional mobility training;Therapeutic activities;Therapeutic exercise;Balance training;Neuromuscular re-education;Patient/family education;Manual techniques;Passive range of motion;Dry needling;Taping    PT Next Visit Plan Assess 5x STS. Continue hip/ core strengthening. Introduce closed chain hip exercises/ balance exercises. Consider introducing mechanical lumbar traction PRN.    PT Home Exercise Plan Access Code: [email or web address]    Consulted and Agree with Plan of Care Patient             Patient will benefit from skilled therapeutic intervention in order to improve the following deficits and impairments:  Abnormal gait, Decreased range of motion, Difficulty walking, Increased fascial restricitons, Decreased endurance, Increased muscle spasms, Decreased activity tolerance, Pain, Impaired perceived functional ability, Improper body mechanics, Decreased mobility, Decreased strength, Impaired sensation, Postural dysfunction  Visit Diagnosis: Muscle weakness (generalized)  Chronic bilateral low back pain with left-sided sciatica  Difficulty in walking, not elsewhere classified     Problem List Patient Active Problem List   Diagnosis Date Noted   Degenerative spondylolisthesis 03/07/2020   S/P lumbar fusion 03/06/2020   HNP (herniated nucleus pulposus), cervical 03/22/2018   S/P laparoscopic cholecystectomy 08/15/2017   Lumbar stenosis with neurogenic claudication 10/05/2015    10/07/2015, PT, DPT 12/28/20 12:38 PM   [facility]  Health Outpatient Rehabilitation [facility] [address] [place], [place], [place] Phone: [phone]   Fax:  [phone]  Name: Brittany [name] MRN: Renee Pain Date of Birth: 1946-03-22

## 2020-12-28 NOTE — Patient Instructions (Signed)
  [email or web address] 

## 2020-12-31 ENCOUNTER — Other Ambulatory Visit: Payer: Self-pay

## 2020-12-31 ENCOUNTER — Ambulatory Visit: Payer: Medicare Other

## 2020-12-31 DIAGNOSIS — G8929 Other chronic pain: Secondary | ICD-10-CM

## 2020-12-31 DIAGNOSIS — R262 Difficulty in walking, not elsewhere classified: Secondary | ICD-10-CM

## 2020-12-31 DIAGNOSIS — M6281 Muscle weakness (generalized): Secondary | ICD-10-CM

## 2020-12-31 DIAGNOSIS — M5442 Lumbago with sciatica, left side: Secondary | ICD-10-CM | POA: Diagnosis not present

## 2020-12-31 NOTE — Patient Instructions (Signed)
  [email or web address] 

## 2020-12-31 NOTE — Therapy (Signed)
[facility] Outpatient Rehabilitation [facility] [address] [place], [place], [address] Phone: [phone]   Fax:  [phone]  Physical Therapy Treatment  Patient Details  Name: Karry Barrilleaux Felkins MRN: [id number] Date of Birth: 03/08/1946 Referring Provider (PT): Meyran, Tiana Loft, NP   Encounter Date: 12/31/2020   PT End of Session - 12/31/20 1256     Visit Number 3    Number of Visits 13    Date for PT Re-Evaluation 02/04/21    Authorization Type UHC - Medicare    Progress Note Due on Visit 10    PT Start Time 1215    PT Stop Time 1300    PT Time Calculation (min) 45 min    Activity Tolerance Patient tolerated treatment well    Behavior During Therapy WFL for tasks assessed/performed             Past Medical History:  Diagnosis Date   Anemia    h/o   Anxiety    Arthritis    Asthma    as child  no problem since   Degenerative spondylolisthesis    L4-5, grade 1   Depression    Headache    hx migraines, tension headaches   HLD (hyperlipidemia)    Hypercholesteremia    Left lumbar radiculopathy    Lumbago    Lumbar degenerative disc disease    Lumbar spondylosis    Lumbar stenosis with neurogenic claudication    L4-5   OCD (obsessive compulsive disorder)    Osteoporosis    Post-nasal drip    Slow transit constipation    Spinal stenosis    Vitamin D deficiency    Weakness of both lower extremities     Past Surgical History:  Procedure Laterality Date   adnoids     ANTERIOR CERVICAL DECOMP/DISCECTOMY FUSION N/A 03/22/2018   Procedure: ANTERIOR CERVICAL DECOMPRESSION/DISCECTOMY FUSION CERVICAL FOUR - CERVICAL FIVE, CERVICAL FIVE - CERVICAL SIX, CERVICAL SIX- CERVICAL SEVEN;  Surgeon: Shirlean Kelly, MD;  Location: [facility] OR;  Service: Neurosurgery;  Laterality: N/A;  ANTERIOR CERVICAL DECOMPRESSION/DISCECTOMY FUSION CERVICAL FOUR - CERVICAL FIVE, CERVICAL FIVE - CERVICAL SIX, CERVICAL SIX- CERVICAL SEVEN   BACK SURGERY  2017    CHOLECYSTECTOMY N/A 08/15/2017   Procedure: LAPAROSCOPIC CHOLECYSTECTOMY;  Surgeon: Abigail Miyamoto, MD;  Location: [facility] OR;  Service: General;  Laterality: N/A;   EYE SURGERY     bil cataract   TONSILLECTOMY     t+a    There were no vitals filed for this visit.   Subjective Assessment - 12/31/20 1214     Subjective Pt reports feeling like she is getting stronger since starting PT, although she still reports feeling weak. She adds that she has been performing her HEP regularly in addition to daily walking.    Currently in Pain? Yes    Pain Score 2     Pain Location Back    Pain Orientation Left;Lower    Pain Descriptors / Indicators Aching    Pain Type Chronic pain    Pain Radiating Towards Anterior L lateral thigh    Pain Onset More than a month ago    Pain Frequency Constant    Multiple Pain Sites No                               [facility] Adult PT Treatment/Exercise - 12/31/20 0001       Lumbar Exercises: Supine   Dead Bug --  2x10 with red physioball     Lumbar Exercises: Sidelying   Hip Abduction --   2x10     Lumbar Exercises: Prone   Other Prone Lumbar Exercises Cobra stretch (Prone press up's) 2x10 superset with donkey kicks      Lumbar Exercises: Quadruped   Other Quadruped Lumbar Exercises Donkey kicks 2x10 BIL superset with cobra stretch      Manual Therapy   Other Manual Therapy Manual lumbar distraction 3x38min                    PT Education - 12/31/20 1247     Education Details Pt educated on peroper form when performing her newly added exercises, along with the overload principle of PT as it concerns physical activity.    Person(s) Educated Patient    Methods Explanation;Demonstration;Tactile cues;Verbal cues;Handout    Comprehension Tactile cues required;Verbal cues required;Returned demonstration;Verbalized understanding              PT Short Term Goals - 12/24/20 1532       PT SHORT TERM GOAL #1   Title be  independent in initial HEP    Time 3    Period Weeks    Status New    Target Date 01/14/21      PT SHORT TERM GOAL #2   Title Pt will be able to tolerate 5x STS in <13 sec    Time 3    Period Weeks    Status New    Target Date 01/14/21      PT SHORT TERM GOAL #3   Title Pt will report decrease in back pain by 25% with standing activities    Time 3    Period Weeks    Status New    Target Date 01/14/21      PT SHORT TERM GOAL #4   Title Pt will be able to amb 10-15 min twice a day to increase her activity level    Time 3    Period Weeks    Status New    Target Date 01/14/21               PT Long Term Goals - 12/24/20 1540       PT LONG TERM GOAL #1   Title be independent in advanced HEP    Time 6    Period Weeks    Status New    Target Date 02/04/21      PT LONG TERM GOAL #2   Title Pt will be able to amb a full block back/forth per her personal walking goals    Time 6    Period Weeks    Status New    Target Date 02/04/21      PT LONG TERM GOAL #3   Title Pt would like to be able to hike an easy trail ([place]. [address])    Time 6    Period Weeks    Status New    Target Date 02/04/21      PT LONG TERM GOAL #4   Title Pt will have improved FOTO score to 52    Time 6    Period Weeks    Status New    Target Date 02/04/21                   Plan - 12/31/20 1257     Clinical Impression Statement The pt responded well to all treatment today, demonstrating proper form with  newly added exercises with no increase in pain. She reports decrease in pain and hip symptoms during manual lumbar distraction. A trial of mechanical lumbar traction was discussed with the pt, to which she agreed. She will continue to benefit from skilled PT to address her primary impairments and to help her return to her prior level of function without limitation.    Personal Factors and Comorbidities Age;Fitness;Time since onset of injury/illness/exacerbation;Comorbidity 1     Comorbidities Anemia, anxiety, arthritis, asthma, depression, HLD, hypercholesterolemia, osteoporosis    Examination-Activity Limitations Locomotion Level;Squat;Stand;Lift;Bend    Examination-Participation Restrictions Cleaning;Community Activity;Shop;Yard Work    Conservation officer, historic buildings Evolving/Moderate complexity    Clinical Decision Making Moderate    Rehab Potential Good    PT Frequency 2x / week    PT Duration 6 weeks    PT Treatment/Interventions ADLs/Self Care Home Management;Cryotherapy;Electrical Stimulation;Iontophoresis 4mg /ml Dexamethasone;Moist Heat;Ultrasound;Gait training;Stair training;Functional mobility training;Therapeutic activities;Therapeutic exercise;Balance training;Neuromuscular re-education;Patient/family education;Manual techniques;Passive range of motion;Dry needling;Taping    PT Next Visit Plan Assess 5x STS. Continue hip/ core strengthening. Introduce closed chain hip exercises/ balance exercises. Consider introducing mechanical lumbar traction PRN.    PT Home Exercise Plan Access Code: [email or web address]    Consulted and Agree with Plan of Care Patient             Patient will benefit from skilled therapeutic intervention in order to improve the following deficits and impairments:  Abnormal gait, Decreased range of motion, Difficulty walking, Increased fascial restricitons, Decreased endurance, Increased muscle spasms, Decreased activity tolerance, Pain, Impaired perceived functional ability, Improper body mechanics, Decreased mobility, Decreased strength, Impaired sensation, Postural dysfunction  Visit Diagnosis: Muscle weakness (generalized)  Chronic bilateral low back pain with left-sided sciatica  Difficulty in walking, not elsewhere classified     Problem List Patient Active Problem List   Diagnosis Date Noted   Degenerative spondylolisthesis 03/07/2020   S/P lumbar fusion 03/06/2020   HNP (herniated nucleus pulposus), cervical 03/22/2018    S/P laparoscopic cholecystectomy 08/15/2017   Lumbar stenosis with neurogenic claudication 10/05/2015    10/07/2015, PT, DPT 12/31/20 1:02 PM   [facility] [address] [place], [place], [place] Phone: [phone]   Fax:  [phone]  Name: Madhuri Vacca Cooley MRN: Renee Pain Date of Birth: Oct 29, 1945

## 2021-01-04 ENCOUNTER — Other Ambulatory Visit: Payer: Self-pay

## 2021-01-04 ENCOUNTER — Ambulatory Visit: Payer: Medicare Other

## 2021-01-04 DIAGNOSIS — M6281 Muscle weakness (generalized): Secondary | ICD-10-CM | POA: Diagnosis not present

## 2021-01-04 DIAGNOSIS — M5442 Lumbago with sciatica, left side: Secondary | ICD-10-CM

## 2021-01-04 DIAGNOSIS — R262 Difficulty in walking, not elsewhere classified: Secondary | ICD-10-CM

## 2021-01-04 DIAGNOSIS — G8929 Other chronic pain: Secondary | ICD-10-CM | POA: Diagnosis not present

## 2021-01-04 NOTE — Patient Instructions (Addendum)
   [id number]

## 2021-01-04 NOTE — Therapy (Signed)
[facility] Outpatient Rehabilitation [facility] [address] [place], [place], [address] Phone: [phone]   Fax:  [phone]  Physical Therapy Treatment  Patient Details  Name: Brittany [name] MRN: [id number] Date of Birth: 1945-11-17 Referring Provider (PT): Meyran, Tiana Loft, NP   Encounter Date: 01/04/2021   PT End of Session - 01/04/21 1522     Visit Number 4    Number of Visits 13    Date for PT Re-Evaluation 02/04/21    Authorization Type UHC - Medicare    Progress Note Due on Visit 10    PT Start Time 1445    PT Stop Time 1525    PT Time Calculation (min) 40 min    Activity Tolerance Patient tolerated treatment well    Behavior During Therapy WFL for tasks assessed/performed             Past Medical History:  Diagnosis Date   Anemia    h/o   Anxiety    Arthritis    Asthma    as child  no problem since   Degenerative spondylolisthesis    L4-5, grade 1   Depression    Headache    hx migraines, tension headaches   HLD (hyperlipidemia)    Hypercholesteremia    Left lumbar radiculopathy    Lumbago    Lumbar degenerative disc disease    Lumbar spondylosis    Lumbar stenosis with neurogenic claudication    L4-5   OCD (obsessive compulsive disorder)    Osteoporosis    Post-nasal drip    Slow transit constipation    Spinal stenosis    Vitamin D deficiency    Weakness of both lower extremities     Past Surgical History:  Procedure Laterality Date   adnoids     ANTERIOR CERVICAL DECOMP/DISCECTOMY FUSION N/A 03/22/2018   Procedure: ANTERIOR CERVICAL DECOMPRESSION/DISCECTOMY FUSION CERVICAL FOUR - CERVICAL FIVE, CERVICAL FIVE - CERVICAL SIX, CERVICAL SIX- CERVICAL SEVEN;  Surgeon: Shirlean Kelly, MD;  Location: [facility] OR;  Service: Neurosurgery;  Laterality: N/A;  ANTERIOR CERVICAL DECOMPRESSION/DISCECTOMY FUSION CERVICAL FOUR - CERVICAL FIVE, CERVICAL FIVE - CERVICAL SIX, CERVICAL SIX- CERVICAL SEVEN   BACK SURGERY  2017    CHOLECYSTECTOMY N/A 08/15/2017   Procedure: LAPAROSCOPIC CHOLECYSTECTOMY;  Surgeon: Abigail Miyamoto, MD;  Location: [facility] OR;  Service: General;  Laterality: N/A;   EYE SURGERY     bil cataract   TONSILLECTOMY     t+a    There were no vitals filed for this visit.   Subjective Assessment - 01/04/21 1450     Subjective Pt reports continuing adherence to her HEP, although she has missed one day since starting PT. She reports still feeling weak, but not as weak as before starting PT. Pt has also walked every day, although she has not made it a full block yet.    Currently in Pain? No/denies    Pain Score 1     Pain Location Back    Pain Orientation Left;Lower    Pain Descriptors / Indicators Aching    Pain Type Chronic pain    Pain Radiating Towards None                               [facility] Adult PT Treatment/Exercise - 01/04/21 0001       Lumbar Exercises: Aerobic   Nustep 5 minutes at level 4; arm length at 15      Knee/Hip  Exercises: Standing   Wall Squat --   2x8   Other Standing Knee Exercises Heel taps on 2-in step front/side 3x10 BIL      Manual Therapy   Manual Therapy Neural Stretch    Neural Stretch Sciatic nerve glides 2x20                    PT Education - 01/04/21 1521     Education Details Pt educated on proper form when performing newly added exercises.    Person(s) Educated Patient    Methods Explanation;Demonstration    Comprehension Returned demonstration;Verbalized understanding              PT Short Term Goals - 12/24/20 1532       PT SHORT TERM GOAL #1   Title be independent in initial HEP    Time 3    Period Weeks    Status New    Target Date 01/14/21      PT SHORT TERM GOAL #2   Title Pt will be able to tolerate 5x STS in <13 sec    Time 3    Period Weeks    Status New    Target Date 01/14/21      PT SHORT TERM GOAL #3   Title Pt will report decrease in back pain by 25% with standing activities    Time  3    Period Weeks    Status New    Target Date 01/14/21      PT SHORT TERM GOAL #4   Title Pt will be able to amb 10-15 min twice a day to increase her activity level    Time 3    Period Weeks    Status New    Target Date 01/14/21               PT Long Term Goals - 12/24/20 1540       PT LONG TERM GOAL #1   Title be independent in advanced HEP    Time 6    Period Weeks    Status New    Target Date 02/04/21      PT LONG TERM GOAL #2   Title Pt will be able to amb a full block back/forth per her personal walking goals    Time 6    Period Weeks    Status New    Target Date 02/04/21      PT LONG TERM GOAL #3   Title Pt would like to be able to hike an easy trail ([place]. [address])    Time 6    Period Weeks    Status New    Target Date 02/04/21      PT LONG TERM GOAL #4   Title Pt will have improved FOTO score to 52    Time 6    Period Weeks    Status New    Target Date 02/04/21                   Plan - 01/04/21 1526     Clinical Impression Statement The pt responded well to all treatment today, demonstrating proper form with newly added exercises with no increase in pain. She reports minor decrease in hip symptoms following L sciatic nerve glides. She will continue to benefit from skilled PT to address her primary impairments and to help her return to her prior level of function without limitation.    Personal Factors and Comorbidities  Age;Fitness;Time since onset of injury/illness/exacerbation;Comorbidity 1    Comorbidities Anemia, anxiety, arthritis, asthma, depression, HLD, hypercholesterolemia, osteoporosis    Examination-Activity Limitations Locomotion Level;Squat;Stand;Lift;Bend    Examination-Participation Restrictions Cleaning;Community Activity;Shop;Yard Work    Conservation officer, historic buildings Evolving/Moderate complexity    Clinical Decision Making Moderate    Rehab Potential Good    PT Frequency 2x / week    PT Duration 6 weeks     PT Treatment/Interventions ADLs/Self Care Home Management;Cryotherapy;Electrical Stimulation;Iontophoresis 4mg /ml Dexamethasone;Moist Heat;Ultrasound;Gait training;Stair training;Functional mobility training;Therapeutic activities;Therapeutic exercise;Balance training;Neuromuscular re-education;Patient/family education;Manual techniques;Passive range of motion;Dry needling;Taping    PT Next Visit Plan Assess 5x STS. Continue hip/ core strengthening. Introduce closed chain hip exercises/ balance exercises. Consider introducing mechanical lumbar traction PRN.    PT Home Exercise Plan Access Code: [email or web address]    Consulted and Agree with Plan of Care Patient             Patient will benefit from skilled therapeutic intervention in order to improve the following deficits and impairments:  Abnormal gait, Decreased range of motion, Difficulty walking, Increased fascial restricitons, Decreased endurance, Increased muscle spasms, Decreased activity tolerance, Pain, Impaired perceived functional ability, Improper body mechanics, Decreased mobility, Decreased strength, Impaired sensation, Postural dysfunction  Visit Diagnosis: Muscle weakness (generalized)  Chronic bilateral low back pain with left-sided sciatica  Difficulty in walking, not elsewhere classified     Problem List Patient Active Problem List   Diagnosis Date Noted   Degenerative spondylolisthesis 03/07/2020   S/P lumbar fusion 03/06/2020   HNP (herniated nucleus pulposus), cervical 03/22/2018   S/P laparoscopic cholecystectomy 08/15/2017   Lumbar stenosis with neurogenic claudication 10/05/2015    10/07/2015, PT, DPT 01/04/21 3:29 PM  [facility] [address] [place], [place], [place] Phone: [phone]   Fax:  [phone]  Name: Akari Defelice Moretto MRN: Renee Pain Date of Birth: [birth year]

## 2021-01-07 ENCOUNTER — Other Ambulatory Visit: Payer: Self-pay

## 2021-01-07 ENCOUNTER — Ambulatory Visit: Payer: Medicare Other

## 2021-01-07 DIAGNOSIS — M5442 Lumbago with sciatica, left side: Secondary | ICD-10-CM

## 2021-01-07 DIAGNOSIS — R262 Difficulty in walking, not elsewhere classified: Secondary | ICD-10-CM

## 2021-01-07 DIAGNOSIS — M6281 Muscle weakness (generalized): Secondary | ICD-10-CM | POA: Diagnosis not present

## 2021-01-07 DIAGNOSIS — G8929 Other chronic pain: Secondary | ICD-10-CM | POA: Diagnosis not present

## 2021-01-07 NOTE — Therapy (Signed)
[facility] Outpatient Rehabilitation [facility] [address] [place], [place], [address] Phone: [phone]   Fax:  [phone]  Physical Therapy Treatment  Patient Details  Name: Brittany [name] MRN: [id number] Date of Birth: 1945/09/12 Referring Provider (PT): Meyran, Tiana Loft, NP   Encounter Date: 01/07/2021   PT End of Session - 01/07/21 1347     Visit Number 5    Number of Visits 13    Date for PT Re-Evaluation 02/04/21    Authorization Type UHC - Medicare    Progress Note Due on Visit 10    PT Start Time 1315    PT Stop Time 1400   20 minutes of mechanical traction, including set-up   PT Time Calculation (min) 45 min    Activity Tolerance Patient tolerated treatment well    Behavior During Therapy WFL for tasks assessed/performed             Past Medical History:  Diagnosis Date   Anemia    h/o   Anxiety    Arthritis    Asthma    as child  no problem since   Degenerative spondylolisthesis    L4-5, grade 1   Depression    Headache    hx migraines, tension headaches   HLD (hyperlipidemia)    Hypercholesteremia    Left lumbar radiculopathy    Lumbago    Lumbar degenerative disc disease    Lumbar spondylosis    Lumbar stenosis with neurogenic claudication    L4-5   OCD (obsessive compulsive disorder)    Osteoporosis    Post-nasal drip    Slow transit constipation    Spinal stenosis    Vitamin D deficiency    Weakness of both lower extremities     Past Surgical History:  Procedure Laterality Date   adnoids     ANTERIOR CERVICAL DECOMP/DISCECTOMY FUSION N/A 03/22/2018   Procedure: ANTERIOR CERVICAL DECOMPRESSION/DISCECTOMY FUSION CERVICAL FOUR - CERVICAL FIVE, CERVICAL FIVE - CERVICAL SIX, CERVICAL SIX- CERVICAL SEVEN;  Surgeon: Shirlean Kelly, MD;  Location: [facility] OR;  Service: Neurosurgery;  Laterality: N/A;  ANTERIOR CERVICAL DECOMPRESSION/DISCECTOMY FUSION CERVICAL FOUR - CERVICAL FIVE, CERVICAL FIVE - CERVICAL SIX, CERVICAL  SIX- CERVICAL SEVEN   BACK SURGERY  2017   CHOLECYSTECTOMY N/A 08/15/2017   Procedure: LAPAROSCOPIC CHOLECYSTECTOMY;  Surgeon: Abigail Miyamoto, MD;  Location: [facility] OR;  Service: General;  Laterality: N/A;   EYE SURGERY     bil cataract   TONSILLECTOMY     t+a    There were no vitals filed for this visit.   Subjective Assessment - 01/07/21 1316     Subjective Pt reports being adherent to her HEP, performing her exercises daily. Pt reports 2/10 low back pain and reports an occasional "stinging" pain that occurs randomly. She adds that she bagan walking the entire block around her house since her last visit which she thinks may be contributing to the slightly increased pain.    Currently in Pain? Yes    Pain Score 2     Pain Location Back    Pain Orientation Left    Pain Descriptors / Indicators Aching    Pain Type Chronic pain    Pain Radiating Towards N/T in bottom of left foot    Pain Onset More than a month ago    Pain Frequency Constant  [facility] Adult PT Treatment/Exercise - 01/07/21 0001       Lumbar Exercises: Stretches   Lower Trunk Rotation --   Supine 2x10 BIL     Lumbar Exercises: Supine   Bridge --   2x10 with RTB around knees     Modalities   Modalities Traction      Traction   Type of Traction Lumbar    Min (lbs) 50    Max (lbs) 65    Hold Time 20sec    Rest Time 60sec    Time 12 min                    PT Education - 01/07/21 1347     Education Details Pt educated of efficacy of mechanical lumbar traction, as well as proper form when performing newly added exercises.    Person(s) Educated Patient    Methods Explanation;Demonstration;Handout    Comprehension Returned demonstration;Verbalized understanding              PT Short Term Goals - 12/24/20 1532       PT SHORT TERM GOAL #1   Title be independent in initial HEP    Time 3    Period Weeks    Status New    Target Date 01/14/21       PT SHORT TERM GOAL #2   Title Pt will be able to tolerate 5x STS in <13 sec    Time 3    Period Weeks    Status New    Target Date 01/14/21      PT SHORT TERM GOAL #3   Title Pt will report decrease in back pain by 25% with standing activities    Time 3    Period Weeks    Status New    Target Date 01/14/21      PT SHORT TERM GOAL #4   Title Pt will be able to amb 10-15 min twice a day to increase her activity level    Time 3    Period Weeks    Status New    Target Date 01/14/21               PT Long Term Goals - 12/24/20 1540       PT LONG TERM GOAL #1   Title be independent in advanced HEP    Time 6    Period Weeks    Status New    Target Date 02/04/21      PT LONG TERM GOAL #2   Title Pt will be able to amb a full block back/forth per her personal walking goals    Time 6    Period Weeks    Status New    Target Date 02/04/21      PT LONG TERM GOAL #3   Title Pt would like to be able to hike an easy trail ([place]. [address])    Time 6    Period Weeks    Status New    Target Date 02/04/21      PT LONG TERM GOAL #4   Title Pt will have improved FOTO score to 52    Time 6    Period Weeks    Status New    Target Date 02/04/21                   Plan - 01/07/21 1358     Clinical Impression Statement The pt responded well to all treatment  today, demonstrating proper form with newly added exercises with no increase in pain. She reports minor decrease in back pain following lumbar traction, stating she feels like it stretched out her low back. She will continue to benefit from skilled PT to address her primary impairments and to help her return to her prior level of function without limitation.    Stability/Clinical Decision Making Evolving/Moderate complexity    Clinical Decision Making Moderate    Rehab Potential Good    PT Frequency 2x / week    PT Duration 6 weeks    PT Treatment/Interventions ADLs/Self Care Home  Management;Cryotherapy;Electrical Stimulation;Iontophoresis 4mg /ml Dexamethasone;Moist Heat;Ultrasound;Gait training;Stair training;Functional mobility training;Therapeutic activities;Therapeutic exercise;Balance training;Neuromuscular re-education;Patient/family education;Manual techniques;Passive range of motion;Dry needling;Taping    PT Next Visit Plan Assess 5x STS. Continue hip/ core strengthening. closed chain hip exercises/ balance exercises.  mechanical lumbar traction PRN.    PT Home Exercise Plan Access Code: [email or web address]    Consulted and Agree with Plan of Care Patient             Patient will benefit from skilled therapeutic intervention in order to improve the following deficits and impairments:  Abnormal gait, Decreased range of motion, Difficulty walking, Increased fascial restricitons, Decreased endurance, Increased muscle spasms, Decreased activity tolerance, Pain, Impaired perceived functional ability, Improper body mechanics, Decreased mobility, Decreased strength, Impaired sensation, Postural dysfunction  Visit Diagnosis: Muscle weakness (generalized)  Chronic bilateral low back pain with left-sided sciatica  Difficulty in walking, not elsewhere classified     Problem List Patient Active Problem List   Diagnosis Date Noted   Degenerative spondylolisthesis 03/07/2020   S/P lumbar fusion 03/06/2020   HNP (herniated nucleus pulposus), cervical 03/22/2018   S/P laparoscopic cholecystectomy 08/15/2017   Lumbar stenosis with neurogenic claudication 10/05/2015    10/07/2015, PT, DPT 01/07/21 2:03 PM   [facility] [address] [place], [place], [place] Phone: [phone]   Fax:  [phone]  Name: Brittany [name] MRN: Renee Pain Date of Birth: Oct 21, 1945

## 2021-01-07 NOTE — Patient Instructions (Signed)
  [email or web address] 

## 2021-01-08 DIAGNOSIS — G4700 Insomnia, unspecified: Secondary | ICD-10-CM | POA: Diagnosis not present

## 2021-01-08 DIAGNOSIS — M4800 Spinal stenosis, site unspecified: Secondary | ICD-10-CM | POA: Diagnosis not present

## 2021-01-12 ENCOUNTER — Ambulatory Visit: Payer: Medicare Other | Attending: Student

## 2021-01-12 ENCOUNTER — Other Ambulatory Visit: Payer: Self-pay

## 2021-01-12 DIAGNOSIS — G8929 Other chronic pain: Secondary | ICD-10-CM | POA: Insufficient documentation

## 2021-01-12 DIAGNOSIS — M5442 Lumbago with sciatica, left side: Secondary | ICD-10-CM | POA: Insufficient documentation

## 2021-01-12 DIAGNOSIS — M6281 Muscle weakness (generalized): Secondary | ICD-10-CM | POA: Diagnosis not present

## 2021-01-12 DIAGNOSIS — R262 Difficulty in walking, not elsewhere classified: Secondary | ICD-10-CM | POA: Insufficient documentation

## 2021-01-12 NOTE — Patient Instructions (Signed)
  [email or web address] 

## 2021-01-12 NOTE — Therapy (Signed)
[facility] Rehabilitation [facility] [address] [place], [place], [address] Phone: [phone]   Fax:  [phone]  Physical Therapy Treatment  Patient Details  Name: Brittany [name] MRN: [id number] Date of Birth: February 14, 1946 Referring Provider (PT): Meyran, Tiana Loft, NP   Encounter Date: 01/12/2021   PT End of Session - 01/12/21 1346     Visit Number 6    Number of Visits 13    Date for PT Re-Evaluation 02/04/21    Authorization Type UHC - Medicare    Progress Note Due on Visit 10    PT Start Time 1320    PT Stop Time 1400    PT Time Calculation (min) 40 min    Activity Tolerance Patient tolerated treatment well    Behavior During Therapy WFL for tasks assessed/performed             Past Medical History:  Diagnosis Date   Anemia    h/o   Anxiety    Arthritis    Asthma    as child  no problem since   Degenerative spondylolisthesis    L4-5, grade 1   Depression    Headache    hx migraines, tension headaches   HLD (hyperlipidemia)    Hypercholesteremia    Left lumbar radiculopathy    Lumbago    Lumbar degenerative disc disease    Lumbar spondylosis    Lumbar stenosis with neurogenic claudication    L4-5   OCD (obsessive compulsive disorder)    Osteoporosis    Post-nasal drip    Slow transit constipation    Spinal stenosis    Vitamin D deficiency    Weakness of both lower extremities     Past Surgical History:  Procedure Laterality Date   adnoids     ANTERIOR CERVICAL DECOMP/DISCECTOMY FUSION N/A 03/22/2018   Procedure: ANTERIOR CERVICAL DECOMPRESSION/DISCECTOMY FUSION CERVICAL FOUR - CERVICAL FIVE, CERVICAL FIVE - CERVICAL SIX, CERVICAL SIX- CERVICAL SEVEN;  Surgeon: Shirlean Kelly, MD;  Location: [facility] OR;  Service: Neurosurgery;  Laterality: N/A;  ANTERIOR CERVICAL DECOMPRESSION/DISCECTOMY FUSION CERVICAL FOUR - CERVICAL FIVE, CERVICAL FIVE - CERVICAL SIX, CERVICAL SIX- CERVICAL SEVEN   BACK SURGERY  2017    CHOLECYSTECTOMY N/A 08/15/2017   Procedure: LAPAROSCOPIC CHOLECYSTECTOMY;  Surgeon: Abigail Miyamoto, MD;  Location: [facility] OR;  Service: General;  Laterality: N/A;   EYE SURGERY     bil cataract   TONSILLECTOMY     t+a    There were no vitals filed for this visit.   Subjective Assessment - 01/12/21 1323     Subjective Pt reports feeling "fine," stating she still feels occasional "stinging" sensation in her low back that is not associated with any particular activity. She reports she felt that the lumbar traction helped last session and would like to try it again today. She has been adherent with her HEP.    Currently in Pain? Yes    Pain Score 2     Pain Location Back    Pain Orientation Left;Lower    Pain Descriptors / Indicators Aching    Pain Type Chronic pain                [facility] PT Assessment - 01/12/21 0001       Observation/Other Assessments   Focus on Therapeutic Outcomes (FOTO)  39      Transfers   Transfers Sit to Stand    Five time sit to stand comments  19sec  [facility] Adult PT Treatment/Exercise - 01/12/21 0001       Lumbar Exercises: Prone   Other Prone Lumbar Exercises Cobra stretch (Prone press up's) 2x10 superset with donkey kicks      Lumbar Exercises: Quadruped   Madcat/Old Horse 20 reps      Modalities   Modalities Traction      Traction   Type of Traction Lumbar    Min (lbs) 50    Max (lbs) 65    Hold Time 20sec    Rest Time 60sec    Time 14 min                    PT Education - 01/12/21 1346     Education Details Pt educated on typical response to mechanical lumbar traction, as well as proper form when performing newly added exercises.    Person(s) Educated Patient    Methods Explanation;Demonstration    Comprehension Verbalized understanding;Returned demonstration              PT Short Term Goals - 12/24/20 1532       PT SHORT TERM GOAL #1   Title be independent in initial  HEP    Time 3    Period Weeks    Status New    Target Date 01/14/21      PT SHORT TERM GOAL #2   Title Pt will be able to tolerate 5x STS in <13 sec    Time 3    Period Weeks    Status New    Target Date 01/14/21      PT SHORT TERM GOAL #3   Title Pt will report decrease in back pain by 25% with standing activities    Time 3    Period Weeks    Status New    Target Date 01/14/21      PT SHORT TERM GOAL #4   Title Pt will be able to amb 10-15 min twice a day to increase her activity level    Time 3    Period Weeks    Status New    Target Date 01/14/21               PT Long Term Goals - 12/24/20 1540       PT LONG TERM GOAL #1   Title be independent in advanced HEP    Time 6    Period Weeks    Status New    Target Date 02/04/21      PT LONG TERM GOAL #2   Title Pt will be able to amb a full block back/forth per her personal walking goals    Time 6    Period Weeks    Status New    Target Date 02/04/21      PT LONG TERM GOAL #3   Title Pt would like to be able to hike an easy trail ([place]. [address])    Time 6    Period Weeks    Status New    Target Date 02/04/21      PT LONG TERM GOAL #4   Title Pt will have improved FOTO score to 52    Time 6    Period Weeks    Status New    Target Date 02/04/21                   Plan - 01/12/21 1341     Clinical Impression Statement The pt responded well  to all treatment today, demonstrating proper form with newly added mobility exercises with no increase in pain. She reports minor decrease in back pain following lumbar traction to 1/10, stating her back feels "looser." The pt's 19sec 5xSTS indicates moderate fall risk. She demonstrates decreased FOTO score, however, the pt refutes the results, stating she thinks she was thinking differently when answering the questions than at the initial eval. She states she definitely feels like she has made excellent progress since starting PT. She will continue to  benefit from skilled PT to address her primary impairments and to help her return to her prior level of function without limitation.    Personal Factors and Comorbidities Age;Fitness;Time since onset of injury/illness/exacerbation;Comorbidity 1    Comorbidities Anemia, anxiety, arthritis, asthma, depression, HLD, hypercholesterolemia, osteoporosis    Examination-Activity Limitations Locomotion Level;Squat;Stand;Lift;Bend    Examination-Participation Restrictions Cleaning;Community Activity;Shop;Yard Work    Conservation officer, historic buildings Evolving/Moderate complexity    Clinical Decision Making Moderate    Rehab Potential Good    PT Frequency 2x / week    PT Duration 6 weeks    PT Treatment/Interventions ADLs/Self Care Home Management;Cryotherapy;Electrical Stimulation;Iontophoresis 4mg /ml Dexamethasone;Moist Heat;Ultrasound;Gait training;Stair training;Functional mobility training;Therapeutic activities;Therapeutic exercise;Balance training;Neuromuscular re-education;Patient/family education;Manual techniques;Passive range of motion;Dry needling;Taping    PT Next Visit Plan Continue hip/ core strengthening. closed chain hip exercises/ balance exercises.  mechanical lumbar traction PRN.    PT Home Exercise Plan Access Code: [email or web address]    Consulted and Agree with Plan of Care Patient             Patient will benefit from skilled therapeutic intervention in order to improve the following deficits and impairments:  Abnormal gait, Decreased range of motion, Difficulty walking, Increased fascial restricitons, Decreased endurance, Increased muscle spasms, Decreased activity tolerance, Pain, Impaired perceived functional ability, Improper body mechanics, Decreased mobility, Decreased strength, Impaired sensation, Postural dysfunction  Visit Diagnosis: Muscle weakness (generalized)  Chronic bilateral low back pain with left-sided sciatica  Difficulty in walking, not elsewhere  classified     Problem List Patient Active Problem List   Diagnosis Date Noted   Degenerative spondylolisthesis 03/07/2020   S/P lumbar fusion 03/06/2020   HNP (herniated nucleus pulposus), cervical 03/22/2018   S/P laparoscopic cholecystectomy 08/15/2017   Lumbar stenosis with neurogenic claudication 10/05/2015    10/07/2015, PT, DPT 01/12/21 2:06 PM   [facility] [address] [place], [place], [place] Phone: [phone]   Fax:  [phone]  Name: Brittany [name] MRN: Renee Pain Date of Birth: 08-28-1945

## 2021-01-14 ENCOUNTER — Other Ambulatory Visit: Payer: Self-pay

## 2021-01-14 ENCOUNTER — Ambulatory Visit: Payer: Medicare Other

## 2021-01-14 DIAGNOSIS — G8929 Other chronic pain: Secondary | ICD-10-CM

## 2021-01-14 DIAGNOSIS — M6281 Muscle weakness (generalized): Secondary | ICD-10-CM

## 2021-01-14 DIAGNOSIS — R262 Difficulty in walking, not elsewhere classified: Secondary | ICD-10-CM | POA: Diagnosis not present

## 2021-01-14 DIAGNOSIS — M5442 Lumbago with sciatica, left side: Secondary | ICD-10-CM | POA: Diagnosis not present

## 2021-01-14 NOTE — Therapy (Signed)
[facility] [address] [place], [place], [address] Phone: [phone]   Fax:  [phone]  Physical Therapy Treatment  Patient Details  Name: Brittany [name] MRN: [id number] Date of Birth: 09-26-1945 Referring Provider (PT): Meyran, Tiana Loft, NP   Encounter Date: 01/14/2021   PT End of Session - 01/14/21 1348     Visit Number 7    Number of Visits 13    Date for PT Re-Evaluation 02/04/21    Authorization Type UHC - Medicare    Progress Note Due on Visit 10    PT Start Time 1320    PT Stop Time 1400    PT Time Calculation (min) 40 min    Activity Tolerance Patient tolerated treatment well    Behavior During Therapy WFL for tasks assessed/performed             Past Medical History:  Diagnosis Date   Anemia    h/o   Anxiety    Arthritis    Asthma    as child  no problem since   Degenerative spondylolisthesis    L4-5, grade 1   Depression    Headache    hx migraines, tension headaches   HLD (hyperlipidemia)    Hypercholesteremia    Left lumbar radiculopathy    Lumbago    Lumbar degenerative disc disease    Lumbar spondylosis    Lumbar stenosis with neurogenic claudication    L4-5   OCD (obsessive compulsive disorder)    Osteoporosis    Post-nasal drip    Slow transit constipation    Spinal stenosis    Vitamin D deficiency    Weakness of both lower extremities     Past Surgical History:  Procedure Laterality Date   adnoids     ANTERIOR CERVICAL DECOMP/DISCECTOMY FUSION N/A 03/22/2018   Procedure: ANTERIOR CERVICAL DECOMPRESSION/DISCECTOMY FUSION CERVICAL FOUR - CERVICAL FIVE, CERVICAL FIVE - CERVICAL SIX, CERVICAL SIX- CERVICAL SEVEN;  Surgeon: Shirlean Kelly, MD;  Location: [facility] OR;  Service: Neurosurgery;  Laterality: N/A;  ANTERIOR CERVICAL DECOMPRESSION/DISCECTOMY FUSION CERVICAL FOUR - CERVICAL FIVE, CERVICAL FIVE - CERVICAL SIX, CERVICAL SIX- CERVICAL SEVEN   BACK SURGERY  2017    CHOLECYSTECTOMY N/A 08/15/2017   Procedure: LAPAROSCOPIC CHOLECYSTECTOMY;  Surgeon: Abigail Miyamoto, MD;  Location: [facility] OR;  Service: General;  Laterality: N/A;   EYE SURGERY     bil cataract   TONSILLECTOMY     t+a    There were no vitals filed for this visit.   Subjective Assessment - 01/14/21 1318     Subjective Pt reports feeling very sore and tight in her low back yesterday following lifting groceries into her home. However, this has since subsided and she reports being at her baseline of pain today, adding that she is still experiencing the "stinging nerve pain" at her L lumbar region, not radiating to the LE. She reports adherence to her HEP and that she is looking forward to mechanical traction today.    Currently in Pain? Yes    Pain Score 2     Pain Location Back    Pain Orientation Left;Lower    Pain Descriptors / Indicators Aching    Pain Type Chronic pain    Pain Onset More than a month ago    Pain Frequency Constant                               [facility]  Adult PT Treatment/Exercise - 01/14/21 0001       Lumbar Exercises: Supine   Other Supine Lumbar Exercises Bicycle kicks 2x15    Other Supine Lumbar Exercises lumbar rotation 2x10 BIL      Lumbar Exercises: Prone   Other Prone Lumbar Exercises Swimmers 2x10      Modalities   Modalities Traction      Traction   Type of Traction Lumbar    Min (lbs) 50    Max (lbs) 65    Hold Time 60sec    Rest Time 20sec    Time 14 min                    PT Education - 01/14/21 1347     Education Details Pt educated on proper form when performing newly added exercises.    Person(s) Educated Patient    Methods Explanation;Demonstration;Handout    Comprehension Verbalized understanding;Returned demonstration              PT Short Term Goals - 01/14/21 1350       PT SHORT TERM GOAL #1   Title be independent in initial HEP    Time 3    Period Weeks    Status Achieved    Target Date  01/14/21      PT SHORT TERM GOAL #2   Title Pt will be able to tolerate 5x STS in <13 sec    Time 3    Period Weeks    Status On-going    Target Date 01/14/21      PT SHORT TERM GOAL #3   Title Pt will report decrease in back pain by 25% with standing activities    Time 3    Period Weeks    Status On-going    Target Date 01/14/21      PT SHORT TERM GOAL #4   Title Pt will be able to amb 10-15 min twice a day to increase her activity level    Time 3    Period Weeks    Status On-going    Target Date 01/14/21               PT Long Term Goals - 12/24/20 1540       PT LONG TERM GOAL #1   Title be independent in advanced HEP    Time 6    Period Weeks    Status New    Target Date 02/04/21      PT LONG TERM GOAL #2   Title Pt will be able to amb a full block back/forth per her personal walking goals    Time 6    Period Weeks    Status New    Target Date 02/04/21      PT LONG TERM GOAL #3   Title Pt would like to be able to hike an easy trail ([place])    Time 6    Period Weeks    Status New    Target Date 02/04/21      PT LONG TERM GOAL #4   Title Pt will have improved FOTO score to 52    Time 6    Period Weeks    Status New    Target Date 02/04/21                   Plan - 01/14/21 1349     Clinical Impression Statement The pt responded well to all treatment today,  demonstrating proper form with newly added mobility exercises with no increase in pain. She reports improved symptoms following lumbar traction and leaves clinic with 1/10 pain. She will continue to benefit from skilled PT to address her primary impairments and to help her return to her prior level of function without limitation.    Personal Factors and Comorbidities Age;Fitness;Time since onset of injury/illness/exacerbation;Comorbidity 1    Comorbidities Anemia, anxiety, arthritis, asthma, depression, HLD, hypercholesterolemia, osteoporosis    Examination-Activity  Limitations Locomotion Level;Squat;Stand;Lift;Bend    Examination-Participation Restrictions Cleaning;Community Activity;Shop;Yard Work    Conservation officer, historic buildings Evolving/Moderate complexity    Clinical Decision Making Moderate    Rehab Potential Good    PT Frequency 2x / week    PT Duration 6 weeks    PT Treatment/Interventions ADLs/Self Care Home Management;Cryotherapy;Electrical Stimulation;Iontophoresis 4mg /ml Dexamethasone;Moist Heat;Ultrasound;Gait training;Stair training;Functional mobility training;Therapeutic activities;Therapeutic exercise;Balance training;Neuromuscular re-education;Patient/family education;Manual techniques;Passive range of motion;Dry needling;Taping    PT Next Visit Plan Continue hip/ core strengthening. closed chain hip exercises/ balance exercises.  mechanical lumbar traction PRN.    PT Home Exercise Plan Access Code: [email or web address]    Consulted and Agree with Plan of Care Patient             Patient will benefit from skilled therapeutic intervention in order to improve the following deficits and impairments:  Abnormal gait, Decreased range of motion, Difficulty walking, Increased fascial restricitons, Decreased endurance, Increased muscle spasms, Decreased activity tolerance, Pain, Impaired perceived functional ability, Improper body mechanics, Decreased mobility, Decreased strength, Impaired sensation, Postural dysfunction  Visit Diagnosis: Muscle weakness (generalized)  Chronic bilateral low back pain with left-sided sciatica  Difficulty in walking, not elsewhere classified     Problem List Patient Active Problem List   Diagnosis Date Noted   Degenerative spondylolisthesis 03/07/2020   S/P lumbar fusion 03/06/2020   HNP (herniated nucleus pulposus), cervical 03/22/2018   S/P laparoscopic cholecystectomy 08/15/2017   Lumbar stenosis with neurogenic claudication 10/05/2015    10/07/2015, PT, DPT 01/14/21 1:53 PM   [facility] Outpatient Rehabilitation [facility] [address] [place], [place], [place] Phone: [phone]   Fax:  [phone]  Name: Tamyra Fojtik Bari MRN: Renee Pain Date of Birth: 1945-11-13

## 2021-01-14 NOTE — Patient Instructions (Signed)
  [email or web address] 

## 2021-01-18 ENCOUNTER — Ambulatory Visit: Payer: Medicare Other

## 2021-01-18 ENCOUNTER — Other Ambulatory Visit: Payer: Self-pay

## 2021-01-18 DIAGNOSIS — M6281 Muscle weakness (generalized): Secondary | ICD-10-CM

## 2021-01-18 DIAGNOSIS — R262 Difficulty in walking, not elsewhere classified: Secondary | ICD-10-CM

## 2021-01-18 DIAGNOSIS — M5442 Lumbago with sciatica, left side: Secondary | ICD-10-CM | POA: Diagnosis not present

## 2021-01-18 DIAGNOSIS — G8929 Other chronic pain: Secondary | ICD-10-CM | POA: Diagnosis not present

## 2021-01-18 NOTE — Patient Instructions (Signed)
  [email or web address] 

## 2021-01-18 NOTE — Therapy (Signed)
[facility]patient [facility] [address] [place], [place], [address] Phone: [phone]   Fax:  [phone]  Physical Therapy Treatment  Patient Details  Name: Brittany [name] MRN: [id number] Date of Birth: 19-Nov-1945 Referring Provider (PT): Meyran, Tiana Loft, NP   Encounter Date: 01/18/2021   PT End of Session - 01/18/21 1529     Visit Number 8    Number of Visits 13    Date for PT Re-Evaluation 02/04/21    Authorization Type UHC - Medicare    Progress Note Due on Visit 10    PT Start Time 1445    PT Stop Time 1530    PT Time Calculation (min) 45 min    Activity Tolerance Patient tolerated treatment well    Behavior During Therapy WFL for tasks assessed/performed             Past Medical History:  Diagnosis Date   Anemia    h/o   Anxiety    Arthritis    Asthma    as child  no problem since   Degenerative spondylolisthesis    L4-5, grade 1   Depression    Headache    hx migraines, tension headaches   HLD (hyperlipidemia)    Hypercholesteremia    Left lumbar radiculopathy    Lumbago    Lumbar degenerative disc disease    Lumbar spondylosis    Lumbar stenosis with neurogenic claudication    L4-5   OCD (obsessive compulsive disorder)    Osteoporosis    Post-nasal drip    Slow transit constipation    Spinal stenosis    Vitamin D deficiency    Weakness of both lower extremities     Past Surgical History:  Procedure Laterality Date   adnoids     ANTERIOR CERVICAL DECOMP/DISCECTOMY FUSION N/A 03/22/2018   Procedure: ANTERIOR CERVICAL DECOMPRESSION/DISCECTOMY FUSION CERVICAL FOUR - CERVICAL FIVE, CERVICAL FIVE - CERVICAL SIX, CERVICAL SIX- CERVICAL SEVEN;  Surgeon: Shirlean Kelly, MD;  [facility]: [facility] OR;  Service: Neurosurgery;  Laterality: N/A;  ANTERIOR CERVICAL DECOMPRESSION/DISCECTOMY FUSION CERVICAL FOUR - CERVICAL FIVE, CERVICAL FIVE - CERVICAL SIX, CERVICAL SIX- CERVICAL SEVEN   BACK SURGERY  2017    CHOLECYSTECTOMY N/A 08/15/2017   Procedure: LAPAROSCOPIC CHOLECYSTECTOMY;  Surgeon: Abigail Miyamoto, MD;  Location: [facility] OR;  Service: General;  Laterality: N/A;   EYE SURGERY     bil cataract   TONSILLECTOMY     t+a    There were no vitals filed for this visit.   Subjective Assessment - 01/18/21 1452     Subjective Pt reports feeling very well this morning, stating she barely has any pain. She rates her pain at 0.5/10 today. The pt adds that she has been regularly adherent to her HEP, performing half of her exercises on one day, then alternating to perform the remainder of her exercises the next day. She reports that she thinks exercises and traction have both been helping.    Currently in Pain? Yes    Pain Score --   0.5/10   Pain Location Back    Pain Orientation Left;Lower    Pain Descriptors / Indicators Aching    Pain Type Chronic pain                               [facility] Adult PT Treatment/Exercise - 01/18/21 0001       Lumbar Exercises: Supine   Other Supine Lumbar  Exercises Bicycle kicks 2x15    Other Supine Lumbar Exercises lumbar rotation 2x10 BIL      Lumbar Exercises: Sidelying   Other Sidelying Lumbar Exercises Side planks 2x30sec BIL      Lumbar Exercises: Quadruped   Madcat/Old Horse 20 reps      Modalities   Modalities Traction      Traction   Type of Traction Lumbar    Min (lbs) 50    Max (lbs) 65    Hold Time 60sec    Rest Time 20sec    Time 14 min                    PT Education - 01/18/21 1528     Education Details Pt educated on proper form when performing newly added exercises.    Person(s) Educated Patient    Methods Explanation;Handout;Demonstration    Comprehension Verbalized understanding;Returned demonstration              PT Short Term Goals - 01/14/21 1350       PT SHORT TERM GOAL #1   Title be independent in initial HEP    Time 3    Period Weeks    Status Achieved    Target Date 01/14/21       PT SHORT TERM GOAL #2   Title Pt will be able to tolerate 5x STS in <13 sec    Time 3    Period Weeks    Status On-going    Target Date 01/14/21      PT SHORT TERM GOAL #3   Title Pt will report decrease in back pain by 25% with standing activities    Time 3    Period Weeks    Status On-going    Target Date 01/14/21      PT SHORT TERM GOAL #4   Title Pt will be able to amb 10-15 min twice a day to increase her activity level    Time 3    Period Weeks    Status On-going    Target Date 01/14/21               PT Long Term Goals - 12/24/20 1540       PT LONG TERM GOAL #1   Title be independent in advanced HEP    Time 6    Period Weeks    Status New    Target Date 02/04/21      PT LONG TERM GOAL #2   Title Pt will be able to amb a full block back/forth per her personal walking goals    Time 6    Period Weeks    Status New    Target Date 02/04/21      PT LONG TERM GOAL #3   Title Pt would like to be able to hike an easy trail ([place])    Time 6    Period Weeks    Status New    Target Date 02/04/21      PT LONG TERM GOAL #4   Title Pt will have improved FOTO score to 52    Time 6    Period Weeks    Status New    Target Date 02/04/21                   Plan - 01/18/21 1529     Clinical Impression Statement The pt responded well to all treatment today, again demonstrating proper form  with newly added mobility exercises with no increase in concordant pain. She reports minor increase in R shoulder pain when performing R side planks; the pt was instructed to use a pillow for cushioning at her shoulder when performing at home. She reports improved symptoms following lumbar traction and leaves clinic with 0/10 pain. She will continue to benefit from skilled PT to address her primary impairments and to help her return to her prior level of function without limitation.    Personal Factors and Comorbidities Age;Fitness;Time since onset of  injury/illness/exacerbation;Comorbidity 1    Comorbidities Anemia, anxiety, arthritis, asthma, depression, HLD, hypercholesterolemia, osteoporosis    Examination-Participation Restrictions Cleaning;Community Activity;Shop;Yard Work    Conservation officer, historic buildings Evolving/Moderate complexity    Clinical Decision Making Moderate    Rehab Potential Good    PT Frequency 2x / week    PT Duration 6 weeks    PT Treatment/Interventions ADLs/Self Care Home Management;Cryotherapy;Electrical Stimulation;Iontophoresis 4mg /ml Dexamethasone;Moist Heat;Ultrasound;Gait training;Stair training;Functional mobility training;Therapeutic activities;Therapeutic exercise;Balance training;Neuromuscular re-education;Patient/family education;Manual techniques;Passive range of motion;Dry needling;Taping    PT Next Visit Plan Continue hip/ core strengthening. closed chain hip exercises/ balance exercises.  mechanical lumbar traction PRN.    PT Home Exercise Plan Access Code: [email or web address]    Consulted and Agree with Plan of Care Patient             Patient will benefit from skilled therapeutic intervention in order to improve the following deficits and impairments:  Abnormal gait, Decreased range of motion, Difficulty walking, Increased fascial restricitons, Decreased endurance, Increased muscle spasms, Decreased activity tolerance, Pain, Impaired perceived functional ability, Improper body mechanics, Decreased mobility, Decreased strength, Impaired sensation, Postural dysfunction  Visit Diagnosis: Muscle weakness (generalized)  Chronic bilateral low back pain with left-sided sciatica  Difficulty in walking, not elsewhere classified     Problem List Patient Active Problem List   Diagnosis Date Noted   Degenerative spondylolisthesis 03/07/2020   S/P lumbar fusion 03/06/2020   HNP (herniated nucleus pulposus), cervical 03/22/2018   S/P laparoscopic cholecystectomy 08/15/2017   Lumbar stenosis with  neurogenic claudication 10/05/2015    10/07/2015, PT, DPT 01/18/21 3:31 PM   [facility] [address] [place], [place], [place] Phone: [phone]   Fax:  [phone]  Name: Brittany [name] MRN: Renee Pain Date of Birth: [birth year]

## 2021-01-21 ENCOUNTER — Ambulatory Visit: Payer: Medicare Other

## 2021-01-21 ENCOUNTER — Other Ambulatory Visit: Payer: Self-pay

## 2021-01-21 DIAGNOSIS — M6281 Muscle weakness (generalized): Secondary | ICD-10-CM | POA: Diagnosis not present

## 2021-01-21 DIAGNOSIS — R262 Difficulty in walking, not elsewhere classified: Secondary | ICD-10-CM | POA: Diagnosis not present

## 2021-01-21 DIAGNOSIS — G8929 Other chronic pain: Secondary | ICD-10-CM | POA: Diagnosis not present

## 2021-01-21 DIAGNOSIS — M5442 Lumbago with sciatica, left side: Secondary | ICD-10-CM | POA: Diagnosis not present

## 2021-01-21 NOTE — Therapy (Signed)
[facility] [address] [place], [place], [address] Phone: [phone]   Fax:  [phone]  Physical Therapy Treatment  Patient Details  Name: Brittany [name] MRN: [id number] Date of Birth: 1945/11/29 Referring Provider (PT): Meyran, Tiana Loft, NP   Encounter Date: 01/21/2021   PT End of Session - 01/21/21 1513     Visit Number 9    Number of Visits 13    Date for PT Re-Evaluation 02/04/21    Authorization Type UHC - Medicare    Progress Note Due on Visit 10    PT Start Time 1450    PT Stop Time 1530    PT Time Calculation (min) 40 min    Activity Tolerance Patient tolerated treatment well    Behavior During Therapy WFL for tasks assessed/performed             Past Medical History:  Diagnosis Date   Anemia    h/o   Anxiety    Arthritis    Asthma    as child  no problem since   Degenerative spondylolisthesis    L4-5, grade 1   Depression    Headache    hx migraines, tension headaches   HLD (hyperlipidemia)    Hypercholesteremia    Left lumbar radiculopathy    Lumbago    Lumbar degenerative disc disease    Lumbar spondylosis    Lumbar stenosis with neurogenic claudication    L4-5   OCD (obsessive compulsive disorder)    Osteoporosis    Post-nasal drip    Slow transit constipation    Spinal stenosis    Vitamin D deficiency    Weakness of both lower extremities     Past Surgical History:  Procedure Laterality Date   adnoids     ANTERIOR CERVICAL DECOMP/DISCECTOMY FUSION N/A 03/22/2018   Procedure: ANTERIOR CERVICAL DECOMPRESSION/DISCECTOMY FUSION CERVICAL FOUR - CERVICAL FIVE, CERVICAL FIVE - CERVICAL SIX, CERVICAL SIX- CERVICAL SEVEN;  Surgeon: Shirlean Kelly, MD;  Location: [facility] OR;  Service: Neurosurgery;  Laterality: N/A;  ANTERIOR CERVICAL DECOMPRESSION/DISCECTOMY FUSION CERVICAL FOUR - CERVICAL FIVE, CERVICAL FIVE - CERVICAL SIX, CERVICAL SIX- CERVICAL SEVEN   BACK SURGERY  2017    CHOLECYSTECTOMY N/A 08/15/2017   Procedure: LAPAROSCOPIC CHOLECYSTECTOMY;  Surgeon: Abigail Miyamoto, MD;  Location: [facility] OR;  Service: General;  Laterality: N/A;   EYE SURGERY     bil cataract   TONSILLECTOMY     t+a    There were no vitals filed for this visit.   Subjective Assessment - 01/21/21 1454     Subjective Pt reports that she was performing her HEP this morning and while doing side planks, she thinks she strained a muscle in her R lumbar region. She reports that the pain is a 3/10, but otherwise she has been doing much better with PT, stating that she feels stronger when walking around her block.    Currently in Pain? Yes    Pain Score 3     Pain Location Back    Pain Orientation Left;Lower    Pain Descriptors / Indicators Aching    Pain Type Chronic pain                               [facility] Adult PT Treatment/Exercise - 01/21/21 0001       Lumbar Exercises: Standing   Other Standing Lumbar Exercises Side bend with GTB resistance 2x10 BIL  Lumbar Exercises: Supine   Other Supine Lumbar Exercises lumbar rotation 2x10 BIL      Modalities   Modalities Traction      Traction   Type of Traction Lumbar    Min (lbs) 50    Max (lbs) 65    Hold Time 60sec    Rest Time 20sec    Time 14 min      Manual Therapy   Manual Therapy Soft tissue mobilization    Soft tissue mobilization Efflourage to R QL and lumbar paraspinals                    PT Education - 01/21/21 1513     Education Details Pt educated on proper form when performing newly added exercises. Also instructed to pause performing the side planks in her HEP for now and to replace them with banded side bends.    Person(s) Educated Patient    Methods Explanation;Demonstration;Handout    Comprehension Verbalized understanding;Returned demonstration              PT Short Term Goals - 01/14/21 1350       PT SHORT TERM GOAL #1   Title be independent in initial HEP     Time 3    Period Weeks    Status Achieved    Target Date 01/14/21      PT SHORT TERM GOAL #2   Title Pt will be able to tolerate 5x STS in <13 sec    Time 3    Period Weeks    Status On-going    Target Date 01/14/21      PT SHORT TERM GOAL #3   Title Pt will report decrease in back pain by 25% with standing activities    Time 3    Period Weeks    Status On-going    Target Date 01/14/21      PT SHORT TERM GOAL #4   Title Pt will be able to amb 10-15 min twice a day to increase her activity level    Time 3    Period Weeks    Status On-going    Target Date 01/14/21               PT Long Term Goals - 12/24/20 1540       PT LONG TERM GOAL #1   Title be independent in advanced HEP    Time 6    Period Weeks    Status New    Target Date 02/04/21      PT LONG TERM GOAL #2   Title Pt will be able to amb a full block back/forth per her personal walking goals    Time 6    Period Weeks    Status New    Target Date 02/04/21      PT LONG TERM GOAL #3   Title Pt would like to be able to hike an easy trail ([place]e. [address])    Time 6    Period Weeks    Status New    Target Date 02/04/21      PT LONG TERM GOAL #4   Title Pt will have improved FOTO score to 52    Time 6    Period Weeks    Status New    Target Date 02/04/21                   Plan - 01/21/21 1531     Clinical  Impression Statement The pt responded well to all treatment today, again demonstrating proper form with newly added mobility exercises with no increase in concordant pain. She comes in with increased R lumbar pain following an acute strain when performing side planks this morning. The pt was instructed to discontinue side planks for now and replace them with standing resisted side bends with a theraband. She reports improved symptoms following lumbar traction and manual therapy and leaves clinic with 1/10 pain. She will continue to benefit from skilled PT to address her primary  impairments and to help her return to her prior level of function without limitation.    Personal Factors and Comorbidities Age;Fitness;Time since onset of injury/illness/exacerbation;Comorbidity 1    Comorbidities Anemia, anxiety, arthritis, asthma, depression, HLD, hypercholesterolemia, osteoporosis    Examination-Activity Limitations Locomotion Level;Squat;Stand;Lift;Bend    Examination-Participation Restrictions Cleaning;Community Activity;Shop;Yard Work    Conservation officer, historic buildings Evolving/Moderate complexity    Clinical Decision Making Moderate    Rehab Potential Good    PT Frequency 2x / week    PT Duration 6 weeks    PT Treatment/Interventions ADLs/Self Care Home Management;Cryotherapy;Electrical Stimulation;Iontophoresis 4mg /ml Dexamethasone;Moist Heat;Ultrasound;Gait training;Stair training;Functional mobility training;Therapeutic activities;Therapeutic exercise;Balance training;Neuromuscular re-education;Patient/family education;Manual techniques;Passive range of motion;Dry needling;Taping    PT Next Visit Plan Continue hip/ core strengthening. closed chain hip exercises/ balance exercises.  mechanical lumbar traction PRN, Progress note next visit*    PT Home Exercise Plan Access Code: [email or web address]    Consulted and Agree with Plan of Care Patient             Patient will benefit from skilled therapeutic intervention in order to improve the following deficits and impairments:  Abnormal gait, Decreased range of motion, Difficulty walking, Increased fascial restricitons, Decreased endurance, Increased muscle spasms, Decreased activity tolerance, Pain, Impaired perceived functional ability, Improper body mechanics, Decreased mobility, Decreased strength, Impaired sensation, Postural dysfunction  Visit Diagnosis: Muscle weakness (generalized)  Chronic bilateral low back pain with left-sided sciatica  Difficulty in walking, not elsewhere classified     Problem  List Patient Active Problem List   Diagnosis Date Noted   Degenerative spondylolisthesis 03/07/2020   S/P lumbar fusion 03/06/2020   HNP (herniated nucleus pulposus), cervical 03/22/2018   S/P laparoscopic cholecystectomy 08/15/2017   Lumbar stenosis with neurogenic claudication 10/05/2015    10/07/2015, PT, DPT 01/21/21 3:35 PM   [facility] [address] [place], [place], [place] Phone: [phone]   Fax:  [phone]  Name: Brittany [name] MRN: Renee Pain Date of Birth: Feb 18, 1946

## 2021-01-21 NOTE — Patient Instructions (Signed)
  [email or web address] 

## 2021-01-25 ENCOUNTER — Other Ambulatory Visit: Payer: Self-pay

## 2021-01-25 ENCOUNTER — Ambulatory Visit: Payer: Medicare Other

## 2021-01-25 DIAGNOSIS — M6281 Muscle weakness (generalized): Secondary | ICD-10-CM | POA: Diagnosis not present

## 2021-01-25 DIAGNOSIS — R262 Difficulty in walking, not elsewhere classified: Secondary | ICD-10-CM

## 2021-01-25 DIAGNOSIS — G8929 Other chronic pain: Secondary | ICD-10-CM

## 2021-01-25 DIAGNOSIS — M5442 Lumbago with sciatica, left side: Secondary | ICD-10-CM | POA: Diagnosis not present

## 2021-01-25 NOTE — Therapy (Signed)
[facility] Outpatient Rehabilitation [facility] [address] [place], [place], [address] Phone: [phone]   Fax:  [phone]  Physical Therapy Treatment/ Progress Note  Patient Details  Name: Brittany [name] MRN: [id number] Date of Birth: 09-20-1945 Referring Provider (PT): Meyran, Tiana Loft, NP   Encounter Date: 01/25/2021   PT End of Session - 01/25/21 1525     Visit Number 10    Number of Visits 13    Date for PT Re-Evaluation 02/04/21    Authorization Type UHC - Medicare    PT Start Time 1445    PT Stop Time 1530    PT Time Calculation (min) 45 min    Activity Tolerance Patient tolerated treatment well    Behavior During Therapy WFL for tasks assessed/performed             Past Medical History:  Diagnosis Date   Anemia    h/o   Anxiety    Arthritis    Asthma    as child  no problem since   Degenerative spondylolisthesis    L4-5, grade 1   Depression    Headache    hx migraines, tension headaches   HLD (hyperlipidemia)    Hypercholesteremia    Left lumbar radiculopathy    Lumbago    Lumbar degenerative disc disease    Lumbar spondylosis    Lumbar stenosis with neurogenic claudication    L4-5   OCD (obsessive compulsive disorder)    Osteoporosis    Post-nasal drip    Slow transit constipation    Spinal stenosis    Vitamin D deficiency    Weakness of both lower extremities     Past Surgical History:  Procedure Laterality Date   adnoids     ANTERIOR CERVICAL DECOMP/DISCECTOMY FUSION N/A 03/22/2018   Procedure: ANTERIOR CERVICAL DECOMPRESSION/DISCECTOMY FUSION CERVICAL FOUR - CERVICAL FIVE, CERVICAL FIVE - CERVICAL SIX, CERVICAL SIX- CERVICAL SEVEN;  Surgeon: Shirlean Kelly, MD;  Location: [facility] OR;  Service: Neurosurgery;  Laterality: N/A;  ANTERIOR CERVICAL DECOMPRESSION/DISCECTOMY FUSION CERVICAL FOUR - CERVICAL FIVE, CERVICAL FIVE - CERVICAL SIX, CERVICAL SIX- CERVICAL SEVEN   BACK SURGERY  2017   CHOLECYSTECTOMY N/A  08/15/2017   Procedure: LAPAROSCOPIC CHOLECYSTECTOMY;  Surgeon: Abigail Miyamoto, MD;  Location: [facility] OR;  Service: General;  Laterality: N/A;   EYE SURGERY     bil cataract   TONSILLECTOMY     t+a    There were no vitals filed for this visit.   Subjective Assessment - 01/25/21 1448     Subjective Pt reports doing well today, stating that she has been able to perform her HEP without any pain. She states she was able o stand for 15 minutes while doing dishes, but this led to increased low back pain.    How long can you stand comfortably? 15 minutes    How long can you walk comfortably? 20 minutes    Currently in Pain? Yes    Pain Score 1     Pain Location Back    Pain Orientation Left;Lower    Pain Descriptors / Indicators Aching    Pain Type Chronic pain                [facility] PT Assessment - 01/25/21 0001       Observation/Other Assessments   Focus on Therapeutic Outcomes (FOTO)  50      AROM   Lumbar Flexion 50%   nervous about bending further   Lumbar Extension 50%  Lumbar - Right Side Bend 2" above knee    Lumbar - Left Side Bend 2" above knee    Lumbar - Right Rotation 50%    Lumbar - Left Rotation 50%      Strength   Right Hip Flexion 4+/5    Right Hip Extension 4/5    Right Hip External Rotation  5/5    Right Hip Internal Rotation 5/5    Right Hip ABduction 4+/5    Left Hip Flexion 4+/5    Left Hip Extension 4+/5    Left Hip External Rotation 5/5    Left Hip Internal Rotation 5/5    Left Hip ABduction 4/5    Right Knee Flexion 5/5    Right Knee Extension 5/5    Left Knee Flexion 5/5    Left Knee Extension 5/5      Palpation   Palpation comment TTP to R paraspinals/ QL "nerve pain"      Transfers   Five time sit to stand comments  17sec                           [facility] Adult PT Treatment/Exercise - 01/25/21 0001       Lumbar Exercises: Standing   Other Standing Lumbar Exercises Pallof press walkout with concentric trunk rotation  at end of walkout 2x6 BIL 7# at cable column      Lumbar Exercises: Supine   Other Supine Lumbar Exercises Open books 2x10 BIL      Manual Therapy   Manual Therapy Soft tissue mobilization    Soft tissue mobilization Efflourage to R QL and lumbar paraspinals                    PT Education - 01/25/21 1525     Education Details Pt educated on proper form when performing newly added exercises    Person(s) Educated Patient    Methods Explanation;Demonstration    Comprehension Verbalized understanding;Returned demonstration              PT Short Term Goals - 01/25/21 1528       PT SHORT TERM GOAL #1   Title be independent in initial HEP    Time 3    Period Weeks    Status Achieved    Target Date 01/14/21      PT SHORT TERM GOAL #2   Title Pt will be able to tolerate 5x STS in <13 sec    Baseline 19sec at eval, 17 sec on 7/18    Time 3    Period Weeks    Status On-going    Target Date 01/14/21      PT SHORT TERM GOAL #3   Title Pt will report decrease in back pain by 25% with standing activities    Baseline Pt reports 25% improvement in back pain with standing exercises    Time 3    Period Weeks    Status Achieved    Target Date 01/14/21      PT SHORT TERM GOAL #4   Title Pt will be able to amb 10-15 min twice a day to increase her activity level    Baseline Pt reports walking a total of 20-30 minutes per day.    Time 3    Period Weeks    Status Achieved    Target Date 01/14/21               PT  Long Term Goals - 01/25/21 1531       PT LONG TERM GOAL #1   Title be independent in advanced HEP    Time 6    Period Weeks    Status Achieved      PT LONG TERM GOAL #2   Title Pt will be able to amb a full block back/forth per her personal walking goals    Time 6    Period Weeks    Status Achieved      PT LONG TERM GOAL #3   Title Pt would like to be able to hike an easy trail ([place]e. [address])    Time 6    Period Weeks    Status  On-going      PT LONG TERM GOAL #4   Title Pt will have improved FOTO score to 52    Baseline 50% on 7/18    Time 6    Period Weeks    Status On-going                   Plan - 01/25/21 1526     Clinical Impression Statement The pt responded well to all treatment today, again demonstrating proper form with newly added exercises with no increase in concordant pain. Upon reassessment, the pt demonstrates improvements in hip strength, lumbar mobility, FOTO score, and 5X STS. She is on track to achieving her functional goals of PT. She will continue to benefit from skilled PT to address her primary impairments and to help her return to her prior level of function without limitation.    Personal Factors and Comorbidities Age;Fitness;Time since onset of injury/illness/exacerbation;Comorbidity 1    Comorbidities Anemia, anxiety, arthritis, asthma, depression, HLD, hypercholesterolemia, osteoporosis    Examination-Activity Limitations Locomotion Level;Squat;Stand;Lift;Bend    Examination-Participation Restrictions Cleaning;Community Activity;Shop;Yard Work    Conservation officer, historic buildings Evolving/Moderate complexity    Clinical Decision Making Moderate    Rehab Potential Good    PT Frequency 2x / week    PT Duration 6 weeks    PT Treatment/Interventions ADLs/Self Care Home Management;Cryotherapy;Electrical Stimulation;Iontophoresis 4mg /ml Dexamethasone;Moist Heat;Ultrasound;Gait training;Stair training;Functional mobility training;Therapeutic activities;Therapeutic exercise;Balance training;Neuromuscular re-education;Patient/family education;Manual techniques;Passive range of motion;Dry needling;Taping    PT Next Visit Plan Continue hip/ core strengthening. closed chain hip exercises/ balance exercises.  mechanical lumbar traction PRN    PT Home Exercise Plan Access Code: [email or web address]    Consulted and Agree with Plan of Care Patient             Patient will benefit from skilled  therapeutic intervention in order to improve the following deficits and impairments:  Abnormal gait, Decreased range of motion, Difficulty walking, Increased fascial restricitons, Decreased endurance, Increased muscle spasms, Decreased activity tolerance, Pain, Impaired perceived functional ability, Improper body mechanics, Decreased mobility, Decreased strength, Impaired sensation, Postural dysfunction  Visit Diagnosis: Muscle weakness (generalized)  Chronic bilateral low back pain with left-sided sciatica  Difficulty in walking, not elsewhere classified     Problem List Patient Active Problem List   Diagnosis Date Noted   Degenerative spondylolisthesis 03/07/2020   S/P lumbar fusion 03/06/2020   HNP (herniated nucleus pulposus), cervical 03/22/2018   S/P laparoscopic cholecystectomy 08/15/2017   Lumbar stenosis with neurogenic claudication 10/05/2015    10/07/2015, PT, DPT 01/25/21 3:33 PM   [facility] [address] [place], [place], [place] Phone: [phone]   Fax:  [phone]  Name: Camdyn Laden Gupta MRN: Renee Pain Date of Birth: 1946/01/29

## 2021-01-25 NOTE — Patient Instructions (Signed)
Reviewed pt's HEP with her; added walkout with concentric contraction progression to her Pallof press home exercise.

## 2021-01-28 ENCOUNTER — Other Ambulatory Visit: Payer: Self-pay

## 2021-01-28 ENCOUNTER — Ambulatory Visit: Payer: Medicare Other

## 2021-01-28 DIAGNOSIS — G8929 Other chronic pain: Secondary | ICD-10-CM | POA: Diagnosis not present

## 2021-01-28 DIAGNOSIS — M5442 Lumbago with sciatica, left side: Secondary | ICD-10-CM | POA: Diagnosis not present

## 2021-01-28 DIAGNOSIS — R262 Difficulty in walking, not elsewhere classified: Secondary | ICD-10-CM | POA: Diagnosis not present

## 2021-01-28 DIAGNOSIS — M6281 Muscle weakness (generalized): Secondary | ICD-10-CM

## 2021-01-28 NOTE — Therapy (Signed)
[facility] [address] [place], [place], [address] Phone: [phone]   Fax:  [phone]  Physical Therapy Treatment  Patient Details  Name: Brittany [name] MRN: [id number] Date of Birth: January 12, 1946 Referring Provider (PT): Meyran, Tiana Loft, NP   Encounter Date: 01/28/2021   PT End of Session - 01/28/21 1506     Visit Number 11    Number of Visits 13    Date for PT Re-Evaluation 02/04/21    Authorization Type UHC - Medicare    PT Start Time 1445    PT Stop Time 1530    PT Time Calculation (min) 45 min    Activity Tolerance Patient tolerated treatment well    Behavior During Therapy WFL for tasks assessed/performed             Past Medical History:  Diagnosis Date   Anemia    h/o   Anxiety    Arthritis    Asthma    as child  no problem since   Degenerative spondylolisthesis    L4-5, grade 1   Depression    Headache    hx migraines, tension headaches   HLD (hyperlipidemia)    Hypercholesteremia    Left lumbar radiculopathy    Lumbago    Lumbar degenerative disc disease    Lumbar spondylosis    Lumbar stenosis with neurogenic claudication    L4-5   OCD (obsessive compulsive disorder)    Osteoporosis    Post-nasal drip    Slow transit constipation    Spinal stenosis    Vitamin D deficiency    Weakness of both lower extremities     Past Surgical History:  Procedure Laterality Date   adnoids     ANTERIOR CERVICAL DECOMP/DISCECTOMY FUSION N/A 03/22/2018   Procedure: ANTERIOR CERVICAL DECOMPRESSION/DISCECTOMY FUSION CERVICAL FOUR - CERVICAL FIVE, CERVICAL FIVE - CERVICAL SIX, CERVICAL SIX- CERVICAL SEVEN;  Surgeon: Shirlean Kelly, MD;  Location: [facility];  Service: Neurosurgery;  Laterality: N/A;  ANTERIOR CERVICAL DECOMPRESSION/DISCECTOMY FUSION CERVICAL FOUR - CERVICAL FIVE, CERVICAL FIVE - CERVICAL SIX, CERVICAL SIX- CERVICAL SEVEN   BACK SURGERY  2017   CHOLECYSTECTOMY N/A 08/15/2017    Procedure: LAPAROSCOPIC CHOLECYSTECTOMY;  Surgeon: Abigail Miyamoto, MD;  Location: [facility] OR;  Service: General;  Laterality: N/A;   EYE SURGERY     bil cataract   TONSILLECTOMY     t+a    There were no vitals filed for this visit.   Subjective Assessment - 01/28/21 1451     Subjective Pt reports doing well today, stating that she has been able to perform her HEP without any pain. She reports only 1/10 L LBP. When given the option to undergo mechanical lumbar traction today vs. therapeutic exercise, the pt opted to do one more treatment of traction.    Currently in Pain? Yes    Pain Score 1     Pain Location Back    Pain Orientation Left;Lower    Pain Descriptors / Indicators Aching    Pain Type Chronic pain                               [facility] Adult PT Treatment/Exercise - 01/28/21 0001       Lumbar Exercises: Standing   Other Standing Lumbar Exercises Pallof press walkout with concentric trunk rotation at end of walkout 2x6 BIL 7# at cable column      Lumbar Exercises: Prone  Other Prone Lumbar Exercises Swimmers 2x10      Lumbar Exercises: Quadruped   Plank 3x to exhaustion      Modalities   Modalities Traction      Traction   Type of Traction Lumbar    Min (lbs) 50    Max (lbs) 65    Hold Time 60sec    Rest Time 20sec    Time 14 min                    PT Education - 01/28/21 1505     Education Details Pt informed of the necessity to ween away from traction and to emphasize additional focus on strengthening/ AROM. Slo instructed on form when performing exercises.    Person(s) Educated Patient    Methods Explanation;Demonstration;Handout    Comprehension Verbalized understanding;Returned demonstration              PT Short Term Goals - 01/25/21 1528       PT SHORT TERM GOAL #1   Title be independent in initial HEP    Time 3    Period Weeks    Status Achieved    Target Date 01/14/21      PT SHORT TERM GOAL #2   Title  Pt will be able to tolerate 5x STS in <13 sec    Baseline 19sec at eval, 17 sec on 7/18    Time 3    Period Weeks    Status On-going    Target Date 01/14/21      PT SHORT TERM GOAL #3   Title Pt will report decrease in back pain by 25% with standing activities    Baseline Pt reports 25% improvement in back pain with standing exercises    Time 3    Period Weeks    Status Achieved    Target Date 01/14/21      PT SHORT TERM GOAL #4   Title Pt will be able to amb 10-15 min twice a day to increase her activity level    Baseline Pt reports walking a total of 20-30 minutes per day.    Time 3    Period Weeks    Status Achieved    Target Date 01/14/21               PT Long Term Goals - 01/25/21 1531       PT LONG TERM GOAL #1   Title be independent in advanced HEP    Time 6    Period Weeks    Status Achieved      PT LONG TERM GOAL #2   Title Pt will be able to amb a full block back/forth per her personal walking goals    Time 6    Period Weeks    Status Achieved      PT LONG TERM GOAL #3   Title Pt would like to be able to hike an easy trail ([place]e. [address])    Time 6    Period Weeks    Status On-going      PT LONG TERM GOAL #4   Title Pt will have improved FOTO score to 52    Baseline 50% on 7/18    Time 6    Period Weeks    Status On-going                   Plan - 01/28/21 1507     Clinical Impression Statement The pt  responded well to all treatment today, again demonstrating proper form with newly added exercises with no increase in concordant pain. She also continues to report a positive response to mechanical lumbar traction. She will continue to benefit from skilled PT to address her primary impairments and to help her return to her prior level of function without limitation.    Personal Factors and Comorbidities Age;Fitness;Time since onset of injury/illness/exacerbation;Comorbidity 1    Comorbidities Anemia, anxiety, arthritis, asthma,  depression, HLD, hypercholesterolemia, osteoporosis    Examination-Activity Limitations Locomotion Level;Squat;Stand;Lift;Bend    Examination-Participation Restrictions Cleaning;Community Activity;Shop;Yard Work    Conservation officer, historic buildings Evolving/Moderate complexity    Clinical Decision Making Moderate    Rehab Potential Good    PT Frequency 2x / week    PT Duration 6 weeks    PT Treatment/Interventions ADLs/Self Care Home Management;Cryotherapy;Electrical Stimulation;Iontophoresis 4mg /ml Dexamethasone;Moist Heat;Ultrasound;Gait training;Stair training;Functional mobility training;Therapeutic activities;Therapeutic exercise;Balance training;Neuromuscular re-education;Patient/family education;Manual techniques;Passive range of motion;Dry needling;Taping    PT Next Visit Plan Continue hip/ core strengthening. closed chain hip exercises/ balance exercises.  Ween away from mechanical lumbar traction    PT Home Exercise Plan Access Code: [email or web address]    Consulted and Agree with Plan of Care Patient             Patient will benefit from skilled therapeutic intervention in order to improve the following deficits and impairments:  Abnormal gait, Decreased range of motion, Difficulty walking, Increased fascial restricitons, Decreased endurance, Increased muscle spasms, Decreased activity tolerance, Pain, Impaired perceived functional ability, Improper body mechanics, Decreased mobility, Decreased strength, Impaired sensation, Postural dysfunction  Visit Diagnosis: Muscle weakness (generalized)  Chronic bilateral low back pain with left-sided sciatica  Difficulty in walking, not elsewhere classified     Problem List Patient Active Problem List   Diagnosis Date Noted   Degenerative spondylolisthesis 03/07/2020   S/P lumbar fusion 03/06/2020   HNP (herniated nucleus pulposus), cervical 03/22/2018   S/P laparoscopic cholecystectomy 08/15/2017   Lumbar stenosis with neurogenic  claudication 10/05/2015    10/07/2015, PT, DPT 01/28/21 3:21 PM   [facility] [address] [place], [place], [place] Phone: [phone]   Fax:  [phone]  Name: Brittany [name] MRN: Renee Pain Date of Birth: Jul 28, 1945

## 2021-01-28 NOTE — Patient Instructions (Signed)
  [email or web address] 

## 2021-02-01 ENCOUNTER — Ambulatory Visit: Payer: Medicare Other

## 2021-02-01 ENCOUNTER — Other Ambulatory Visit: Payer: Self-pay

## 2021-02-01 DIAGNOSIS — M6281 Muscle weakness (generalized): Secondary | ICD-10-CM | POA: Diagnosis not present

## 2021-02-01 DIAGNOSIS — R262 Difficulty in walking, not elsewhere classified: Secondary | ICD-10-CM | POA: Diagnosis not present

## 2021-02-01 DIAGNOSIS — M5442 Lumbago with sciatica, left side: Secondary | ICD-10-CM | POA: Diagnosis not present

## 2021-02-01 DIAGNOSIS — G8929 Other chronic pain: Secondary | ICD-10-CM | POA: Diagnosis not present

## 2021-02-01 NOTE — Patient Instructions (Signed)
HEP reviewed with pt. No exercises added today due to pt having a robust HEP in place.

## 2021-02-01 NOTE — Therapy (Signed)
[facility] [place], [place], [address] Phone: [phone]   Fax:  [phone]  Physical Therapy Treatment  Patient Details  Name: Brittany [name] MRN: [id number] Date of Birth: Dec 09, 1945 Referring Provider (PT): Meyran, Ocie Cornfield, NP   Encounter Date: 02/01/2021   PT End of Session - 02/01/21 1509     Visit Number 12    Number of Visits 13    Date for PT Re-Evaluation 02/04/21    Authorization Type UHC - Medicare    Progress Note Due on Visit 10    PT Start Time 6861    PT Stop Time 1530    PT Time Calculation (min) 45 min    Activity Tolerance Patient tolerated treatment well    Behavior During Therapy WFL for tasks assessed/performed             Past Medical History:  Diagnosis Date   Anemia    h/o   Anxiety    Arthritis    Asthma    as child  no problem since   Degenerative spondylolisthesis    L4-5, grade 1   Depression    Headache    hx migraines, tension headaches   HLD (hyperlipidemia)    Hypercholesteremia    Left lumbar radiculopathy    Lumbago    Lumbar degenerative disc disease    Lumbar spondylosis    Lumbar stenosis with neurogenic claudication    L4-5   OCD (obsessive compulsive disorder)    Osteoporosis    Post-nasal drip    Slow transit constipation    Spinal stenosis    Vitamin D deficiency    Weakness of both lower extremities     Past Surgical History:  Procedure Laterality Date   adnoids     ANTERIOR CERVICAL DECOMP/DISCECTOMY FUSION N/A 03/22/2018   Procedure: ANTERIOR CERVICAL DECOMPRESSION/DISCECTOMY FUSION CERVICAL FOUR - CERVICAL FIVE, CERVICAL FIVE - CERVICAL SIX, CERVICAL SIX- CERVICAL SEVEN;  Surgeon: Jovita Gamma, MD;  Location: [facility];  Service: Neurosurgery;  Laterality: N/A;  ANTERIOR CERVICAL DECOMPRESSION/DISCECTOMY FUSION CERVICAL FOUR - CERVICAL FIVE, CERVICAL FIVE - CERVICAL SIX, CERVICAL SIX- CERVICAL SEVEN   BACK SURGERY  2017    CHOLECYSTECTOMY N/A 08/15/2017   Procedure: LAPAROSCOPIC CHOLECYSTECTOMY;  Surgeon: Coralie Keens, MD;  Location: [facility];  Service: General;  Laterality: N/A;   EYE SURGERY     bil cataract   TONSILLECTOMY     t+a    There were no vitals filed for this visit.   Subjective Assessment - 02/01/21 1452     Subjective Pt reports feeling well today with pain between 0-1/10. She reports continued adherence to her HEP, as well as walking a block daily. She adds that she is very comfortable walking that distance at this time.    Pain Score --   0-1/10   Pain Location Back    Pain Orientation Left;Lower                               [facility] Adult PT Treatment/Exercise - 02/01/21 0001       Lumbar Exercises: Standing   Other Standing Lumbar Exercises Pallof press walkout with concentric trunk rotation at end of walkout 2x8 BIL 3# at cable column    Other Standing Lumbar Exercises Resisted side bend at cable column 2x10 BIL 10#      Lumbar Exercises: Quadruped   Plank 3x to exhaustion  Knee plank     Knee/Hip Exercises: Standing   Other Standing Knee Exercises Mini-squat side steps in // bars 3x2 laps      Modalities   Modalities Moist Heat      Moist Heat Therapy   Number Minutes Moist Heat 10 Minutes    Moist Heat Location Lumbar Spine                    PT Education - 02/01/21 1509     Education Details Instructed on proper form when perofrming exercises.    Person(s) Educated Patient    Methods Explanation;Demonstration;Verbal cues    Comprehension Verbal cues required;Returned demonstration;Verbalized understanding              PT Short Term Goals - 01/25/21 1528       PT SHORT TERM GOAL #1   Title be independent in initial HEP    Time 3    Period Weeks    Status Achieved    Target Date 01/14/21      PT SHORT TERM GOAL #2   Title Pt will be able to tolerate 5x STS in <13 sec    Baseline 19sec at eval, 17 sec on 7/18    Time 3     Period Weeks    Status On-going    Target Date 01/14/21      PT SHORT TERM GOAL #3   Title Pt will report decrease in back pain by 25% with standing activities    Baseline Pt reports 25% improvement in back pain with standing exercises    Time 3    Period Weeks    Status Achieved    Target Date 01/14/21      PT SHORT TERM GOAL #4   Title Pt will be able to amb 10-15 min twice a day to increase her activity level    Baseline Pt reports walking a total of 20-30 minutes per day.    Time 3    Period Weeks    Status Achieved    Target Date 01/14/21               PT Long Term Goals - 01/25/21 1531       PT LONG TERM GOAL #1   Title be independent in advanced HEP    Time 6    Period Weeks    Status Achieved      PT LONG TERM GOAL #2   Title Pt will be able to amb a full block back/forth per her personal walking goals    Time 6    Period Weeks    Status Achieved      PT LONG TERM GOAL #3   Title Pt would like to be able to hike an easy trail ([place]e. [address])    Time 6    Period Weeks    Status On-going      PT LONG TERM GOAL #4   Title Pt will have improved FOTO score to 52    Baseline 50% on 7/18    Time 6    Period Weeks    Status On-going                   Plan - 02/01/21 1510     Clinical Impression Statement The pt responded well to all treatment today, again demonstrating proper form with all exercises with no increase in concordant pain. However, she required regular verbal cuing when performing side bends  to prevent trunk flexion with the movement. Following moist heat, she leaves clinic with 0/10 pain.  She will continue to benefit from skilled PT to address her primary impairments and to help her return to her prior level of function without limitation.    Personal Factors and Comorbidities Age;Fitness;Time since onset of injury/illness/exacerbation;Comorbidity 1    Comorbidities Anemia, anxiety, arthritis, asthma, depression, HLD,  hypercholesterolemia, osteoporosis    Examination-Activity Limitations Locomotion Level;Squat;Stand;Lift;Bend    Examination-Participation Restrictions Cleaning;Community Activity;Shop;Yard Work    Merchant navy officer Evolving/Moderate complexity    Clinical Decision Making Moderate    Rehab Potential Good    PT Frequency 2x / week    PT Duration 6 weeks    PT Treatment/Interventions ADLs/Self Care Home Management;Cryotherapy;Electrical Stimulation;Iontophoresis 81m/ml Dexamethasone;Moist Heat;Ultrasound;Gait training;Stair training;Functional mobility training;Therapeutic activities;Therapeutic exercise;Balance training;Neuromuscular re-education;Patient/family education;Manual techniques;Passive range of motion;Dry needling;Taping    PT Next Visit Plan Consider D/C if goals met    PT Home Exercise Plan Access Code: [email or web address]    [email or web address] Agree with Plan of Care Patient             Patient will benefit from skilled therapeutic intervention in order to improve the following deficits and impairments:  Abnormal gait, Decreased range of motion, Difficulty walking, Increased fascial restricitons, Decreased endurance, Increased muscle spasms, Decreased activity tolerance, Pain, Impaired perceived functional ability, Improper body mechanics, Decreased mobility, Decreased strength, Impaired sensation, Postural dysfunction  Visit Diagnosis: Muscle weakness (generalized)  Chronic bilateral low back pain with left-sided sciatica  Difficulty in walking, not elsewhere classified     Problem List Patient Active Problem List   Diagnosis Date Noted   Degenerative spondylolisthesis 03/07/2020   S/P lumbar fusion 03/06/2020   HNP (herniated nucleus pulposus), cervical 03/22/2018   S/P laparoscopic cholecystectomy 08/15/2017   Lumbar stenosis with neurogenic claudication 10/05/2015    YVanessa Osprey PT, DPT 02/01/21 3:21 PM   [facility]19[address] Prospect StreetGHuttons[place] [address]Phone: [phone]  Fax:  [phone] Name: BCaresse SedivyCouncil MRN: [id number] of Birth: 4August 15, 1947

## 2021-02-04 DIAGNOSIS — Z981 Arthrodesis status: Secondary | ICD-10-CM | POA: Diagnosis not present

## 2021-02-05 ENCOUNTER — Ambulatory Visit: Payer: Medicare Other

## 2021-02-05 ENCOUNTER — Other Ambulatory Visit: Payer: Self-pay

## 2021-02-05 DIAGNOSIS — G8929 Other chronic pain: Secondary | ICD-10-CM | POA: Diagnosis not present

## 2021-02-05 DIAGNOSIS — M6281 Muscle weakness (generalized): Secondary | ICD-10-CM | POA: Diagnosis not present

## 2021-02-05 DIAGNOSIS — R262 Difficulty in walking, not elsewhere classified: Secondary | ICD-10-CM

## 2021-02-05 DIAGNOSIS — M5442 Lumbago with sciatica, left side: Secondary | ICD-10-CM | POA: Diagnosis not present

## 2021-02-05 NOTE — Therapy (Signed)
[place], [place], [address] Phone: [phone]   Fax:  [phone]  Physical Therapy Treatment/ Re-certification/ Discharge Summary  Progress Note Reporting Period 02/05/2021 to 02/05/2021  See note below for Objective Data and Assessment of Progress/Goals.      Patient Details  Name: Brittany [name] MRN: [id number] Date of Birth: 1945-08-22 Referring Provider (PT): Meyran, Ocie Cornfield, NP   Encounter Date: 02/05/2021   PT End of Session - 02/05/21 1349     Visit Number 13    Number of Visits 13    Date for PT Re-Evaluation 02/04/21    Authorization Type UHC - Medicare    Progress Note Due on Visit 10    PT Start Time 1325    PT Stop Time 1410    PT Time Calculation (min) 45 min    Activity Tolerance Patient tolerated treatment well    Behavior During Therapy WFL for tasks assessed/performed             Past Medical History:  Diagnosis Date   Anemia    h/o   Anxiety    Arthritis    Asthma    as child  no problem since   Degenerative spondylolisthesis    L4-5, grade 1   Depression    Headache    hx migraines, tension headaches   HLD (hyperlipidemia)    Hypercholesteremia    Left lumbar radiculopathy    Lumbago    Lumbar degenerative disc disease    Lumbar spondylosis    Lumbar stenosis with neurogenic claudication    L4-5   OCD (obsessive compulsive disorder)    Osteoporosis    Post-nasal drip    Slow transit constipation    Spinal stenosis    Vitamin D deficiency    Weakness of both lower extremities     Past Surgical History:  Procedure Laterality Date   adnoids     ANTERIOR CERVICAL DECOMP/DISCECTOMY FUSION N/A 03/22/2018   Procedure: ANTERIOR CERVICAL DECOMPRESSION/DISCECTOMY FUSION CERVICAL FOUR - CERVICAL FIVE, CERVICAL FIVE - CERVICAL SIX, CERVICAL SIX- CERVICAL SEVEN;  Surgeon: Jovita Gamma, MD;  Location: [facility];  Service: Neurosurgery;  Laterality: N/A;   ANTERIOR CERVICAL DECOMPRESSION/DISCECTOMY FUSION CERVICAL FOUR - CERVICAL FIVE, CERVICAL FIVE - CERVICAL SIX, CERVICAL SIX- CERVICAL SEVEN   BACK SURGERY  2017   CHOLECYSTECTOMY N/A 08/15/2017   Procedure: LAPAROSCOPIC CHOLECYSTECTOMY;  Surgeon: Coralie Keens, MD;  Location: [facility];  Service: General;  Laterality: N/A;   EYE SURGERY     bil cataract   TONSILLECTOMY     t+a    There were no vitals filed for this visit.   Subjective Assessment - 02/05/21 1328     Subjective Pt reports 1-2/10 LBP today, which she attributes to having to bend over to cut her toenails a few days ago. She reports continued adherence to her HEP and walking. She adds that she feel ready to be discharged from PT at this time to independently manage her symptoms.    How long can you sit comfortably? Unlimited    How long can you stand comfortably? 15    How long can you walk comfortably? 1 hour    Currently in Pain? Yes    Pain Score 2     Pain Location Back    Pain Orientation Left;Lower    Pain Descriptors / Indicators Aching    Pain Type Chronic pain  [facility] PT Assessment - 02/05/21 0001       Observation/Other Assessments   Focus on Therapeutic Outcomes (FOTO)  51      AROM   Lumbar Flexion 70%    Lumbar Extension 80%    Lumbar - Right Side Bend 1" above knee    Lumbar - Left Side Bend 1" above knee    Lumbar - Right Rotation 70%    Lumbar - Left Rotation 70%      Strength   Right Hip Flexion 4+/5    Right Hip Extension 4+/5    Right Hip External Rotation  5/5    Right Hip Internal Rotation 5/5    Right Hip ABduction 4+/5    Left Hip Flexion 5/5    Left Hip Extension 4+/5    Left Hip External Rotation 5/5    Left Hip Internal Rotation 5/5    Left Hip ABduction 4+/5      Transfers   Five time sit to stand comments  17sec                           [facility] Adult PT Treatment/Exercise - 02/05/21 0001       Lumbar Exercises: Seated   Other Seated  Lumbar Exercises Sciatic nerve glides 2x20 on L      Lumbar Exercises: Supine   Other Supine Lumbar Exercises Trunk rotation 2x10 BIL      Lumbar Exercises: Prone   Other Prone Lumbar Exercises Prone press up 2x10      Modalities   Modalities Cryotherapy      Cryotherapy   Number Minutes Cryotherapy 10 Minutes    Cryotherapy Location Lumbar Spine    Type of Cryotherapy Ice pack      Manual Therapy   Manual Therapy Soft tissue mobilization    Soft tissue mobilization Efflourage to R QL and lumbar paraspinals                    PT Education - 02/05/21 1348     Education Details Instructed on proper form when performing her exercises.    Person(s) Educated Patient    Methods Explanation;Demonstration;Verbal cues    Comprehension Verbalized understanding;Returned demonstration;Verbal cues required              PT Short Term Goals - 02/05/21 1342       PT SHORT TERM GOAL #1   Title be independent in initial HEP    Time 3    Period Weeks    Status Achieved    Target Date 01/14/21      PT SHORT TERM GOAL #2   Title Pt will be able to tolerate 5x STS in <13 sec    Baseline 19sec at eval, 17 sec on 7/18, 17sec on 7/29    Time 3    Period Weeks    Status Partially Met    Target Date 01/14/21      PT SHORT TERM GOAL #3   Title Pt will report decrease in back pain by 25% with standing activities    Baseline Pt reports 25% improvement in back pain with standing exercises    Time 3    Period Weeks    Status Achieved    Target Date 01/14/21      PT SHORT TERM GOAL #4   Title Pt will be able to amb 10-15 min twice a day to increase her activity level  Baseline Pt reports walking a total of 20-30 minutes per day.    Time 3    Period Weeks    Status Achieved    Target Date 01/14/21               PT Long Term Goals - 02/05/21 1343       PT LONG TERM GOAL #1   Title be independent in advanced HEP    Time 6    Period Weeks    Status Achieved       PT LONG TERM GOAL #2   Title Pt will be able to amb a full block back/forth per her personal walking goals    Time 6    Period Weeks    Status Achieved      PT LONG TERM GOAL #3   Title Pt would like to be able to hike an easy trail ([place].e. [address])    Time 6    Period Weeks    Status Not Met      PT LONG TERM GOAL #4   Title Pt will have improved FOTO score to 52    Baseline 50% on 7/18    Time 6    Period Weeks    Status Partially Met                   Plan - 02/05/21 1350     Clinical Impression Statement Upon reassessment, the pt has made minor gains in lumbar AROM and hip strength. However, she has experienced an overal plateau in symptoms improvement, with no improvement in 5xSTS, a 1 point improvement in her FOTO score, and continued low level LBP and radicular sxs. Due to a plateau in symptoms improvement, the pt is discharged from PT at this time to independently manage her sxs with a robust HEP. She was advised to go to her PCP if her pain increases in the future.    Personal Factors and Comorbidities Age;Fitness;Time since onset of injury/illness/exacerbation;Comorbidity 1    Comorbidities Anemia, anxiety, arthritis, asthma, depression, HLD, hypercholesterolemia, osteoporosis    Examination-Activity Limitations Locomotion Level;Squat;Stand;Lift;Bend    Examination-Participation Restrictions Cleaning;Community Activity;Shop;Yard Work    Stability/Clinical Decision Making Evolving/Moderate complexity    Clinical Decision Making Moderate    Rehab Potential Good    PT Treatment/Interventions ADLs/Self Care Home Management;Cryotherapy;Electrical Stimulation;Iontophoresis 23m/ml Dexamethasone;Moist Heat;Ultrasound;Gait training;Stair training;Functional mobility training;Therapeutic activities;Therapeutic exercise;Balance training;Neuromuscular re-education;Patient/family education;Manual techniques;Passive range of motion;Dry needling;Taping    PT Next Visit  Plan Pt is D/C'd from PT    PT Home Exercise Plan Access Code: [email or web address]   Consulted and Agree with Plan of Care Patient             Patient will benefit from skilled therapeutic intervention in order to improve the following deficits and impairments:  Abnormal gait, Decreased range of motion, Difficulty walking, Increased fascial restricitons, Decreased endurance, Increased muscle spasms, Decreased activity tolerance, Pain, Impaired perceived functional ability, Improper body mechanics, Decreased mobility, Decreased strength, Impaired sensation, Postural dysfunction  Visit Diagnosis: Muscle weakness (generalized)  Chronic bilateral low back pain with left-sided sciatica  Difficulty in walking, not elsewhere classified     Problem List Patient Active Problem List   Diagnosis Date Noted   Degenerative spondylolisthesis 03/07/2020   S/P lumbar fusion 03/06/2020   HNP (herniated nucleus pulposus), cervical 03/22/2018   S/P laparoscopic cholecystectomy 08/15/2017   Lumbar stenosis with neurogenic claudication 10/05/2015     [facility]  [place], [place], [address] Phone: [phone]   Fax:  [phone]  Name: Brittany [name] MRN: [id number] Date of Birth: Dec 19, 1945  PHYSICAL THERAPY DISCHARGE SUMMARY  Visits from Start of Care: 13  Current functional level related to goals / functional outcomes: Pt has met most of her therapy goals, but has reached a plateau in progress. She is currently unable to hike trails and her 5xSTS is still not at or below age-related norm.   Remaining deficits: Limited lumbar AROM in all planes, balance deficits, LBP   Education / Equipment: Robust HEP   Patient agrees to discharge. Patient goals were partially met. Patient is being discharged due to maximized rehab potential.   Vanessa [place], PT, DPT 02/05/21 2:08 PM

## 2021-02-05 NOTE — Patient Instructions (Signed)
HEP reviewed with pt; discontinued some older exercises. Pt educated how to independently progress her exercises.

## 2021-02-13 DIAGNOSIS — K047 Periapical abscess without sinus: Secondary | ICD-10-CM | POA: Diagnosis not present

## 2021-03-04 DIAGNOSIS — R220 Localized swelling, mass and lump, head: Secondary | ICD-10-CM | POA: Diagnosis not present

## 2021-03-17 DIAGNOSIS — J029 Acute pharyngitis, unspecified: Secondary | ICD-10-CM | POA: Diagnosis not present

## 2021-03-17 DIAGNOSIS — S5012XA Contusion of left forearm, initial encounter: Secondary | ICD-10-CM | POA: Diagnosis not present

## 2021-04-23 DIAGNOSIS — M4800 Spinal stenosis, site unspecified: Secondary | ICD-10-CM | POA: Diagnosis not present

## 2021-04-23 DIAGNOSIS — G4700 Insomnia, unspecified: Secondary | ICD-10-CM | POA: Diagnosis not present

## 2021-08-10 DIAGNOSIS — B351 Tinea unguium: Secondary | ICD-10-CM | POA: Diagnosis not present

## 2021-08-10 DIAGNOSIS — M2012 Hallux valgus (acquired), left foot: Secondary | ICD-10-CM | POA: Diagnosis not present

## 2021-08-23 ENCOUNTER — Other Ambulatory Visit: Payer: Self-pay

## 2021-08-23 ENCOUNTER — Ambulatory Visit (INDEPENDENT_AMBULATORY_CARE_PROVIDER_SITE_OTHER): Payer: Medicare Other

## 2021-08-23 ENCOUNTER — Ambulatory Visit: Payer: Medicare Other | Admitting: Podiatry

## 2021-08-23 DIAGNOSIS — M2012 Hallux valgus (acquired), left foot: Secondary | ICD-10-CM | POA: Diagnosis not present

## 2021-08-23 DIAGNOSIS — L989 Disorder of the skin and subcutaneous tissue, unspecified: Secondary | ICD-10-CM | POA: Diagnosis not present

## 2021-08-23 DIAGNOSIS — M79675 Pain in left toe(s): Secondary | ICD-10-CM

## 2021-08-23 DIAGNOSIS — B351 Tinea unguium: Secondary | ICD-10-CM | POA: Diagnosis not present

## 2021-08-23 DIAGNOSIS — M79674 Pain in right toe(s): Secondary | ICD-10-CM

## 2021-08-23 NOTE — Progress Notes (Signed)
SUBJECTIVE Patient presents to office today complaining of elongated, thickened nails that cause pain while ambulating in shoes.  Patient is unable to trim their own nails.  Patient also developed symptomatic calluses to the weightbearing surfaces of the bilateral feet.  She would like to have them evaluated.  Finally the patient states that she has developed a bunion deformity for several years to the left foot.  The right foot is doing fine and does not have bunion deformity she says.  She is concerned and would like to have the bunion evaluated today.  Patient is here for further evaluation and treatment.  Past Medical History:  Diagnosis Date   Anemia    h/o   Anxiety    Arthritis    Asthma    as child  no problem since   Degenerative spondylolisthesis    L4-5, grade 1   Depression    Headache    hx migraines, tension headaches   HLD (hyperlipidemia)    Hypercholesteremia    Left lumbar radiculopathy    Lumbago    Lumbar degenerative disc disease    Lumbar spondylosis    Lumbar stenosis with neurogenic claudication    L4-5   OCD (obsessive compulsive disorder)    Osteoporosis    Post-nasal drip    Slow transit constipation    Spinal stenosis    Vitamin D deficiency    Weakness of both lower extremities     OBJECTIVE General Patient is awake, alert, and oriented x 3 and in no acute distress. Derm Skin is dry and supple bilateral. Negative open lesions or macerations. Remaining integument unremarkable. Nails are tender, long, thickened and dystrophic with subungual debris, consistent with onychomycosis, 1-5 bilateral. No signs of infection noted.  Hyperkeratotic preulcerative callus tissue also noted to the bilateral feet plantar aspect of the second MTP joints Vasc  DP and PT pedal pulses palpable bilaterally. Temperature gradient within normal limits.  Neuro Epicritic and protective threshold sensation grossly intact bilaterally.  Musculoskeletal Exam No symptomatic  pedal deformities noted bilateral. Muscular strength within normal limits. Radiographic exam LT foot normal osseous mineralization.  No fractures identified.  Increased intermetatarsal angle between the first and second metatarsals with increased hallux abductus angle consistent with a bunion deformity.  Medial prominence of the first metatarsal head.  ASSESSMENT 1.  Pain due to onychomycosis of toenails both 2.  Symptomatic calluses bilateral feet 3.  Hallux valgus left  PLAN OF CARE 1. Patient evaluated today.  X-rays reviewed 2. Instructed to maintain good pedal hygiene and foot care.  3. Mechanical debridement of nails 1-5 bilaterally performed using a nail nipper. Filed with dremel without incident.  4.  Excisional debridement of the hyperkeratotic callus tissue was performed using a 312 scalpel without incident or bleeding.   5.  In regards to the hallux valgus of the foot, we did discuss conservative and surgical management of the bunion deformity.  For now the bunion is very minimally symptomatic so we will continue to pursue conservative treatment.  Recommend wide fitting shoes that support the foot and do not constrict the toebox area  6.  Return to clinic in 3 mos.    Edrick Kins, DPM [facility]  Dr. Edrick Kins, DPM    [address]  [place], [place] [address]                Office [phone]  Fax [phone]

## 2021-09-22 DIAGNOSIS — M25552 Pain in left hip: Secondary | ICD-10-CM | POA: Diagnosis not present

## 2021-09-22 DIAGNOSIS — M4800 Spinal stenosis, site unspecified: Secondary | ICD-10-CM | POA: Diagnosis not present

## 2021-09-22 DIAGNOSIS — M150 Primary generalized (osteo)arthritis: Secondary | ICD-10-CM | POA: Diagnosis not present

## 2021-10-01 DIAGNOSIS — H40013 Open angle with borderline findings, low risk, bilateral: Secondary | ICD-10-CM | POA: Diagnosis not present

## 2021-10-01 DIAGNOSIS — H43813 Vitreous degeneration, bilateral: Secondary | ICD-10-CM | POA: Diagnosis not present

## 2021-10-01 DIAGNOSIS — H524 Presbyopia: Secondary | ICD-10-CM | POA: Diagnosis not present

## 2021-10-01 DIAGNOSIS — H26493 Other secondary cataract, bilateral: Secondary | ICD-10-CM | POA: Diagnosis not present

## 2021-10-07 DIAGNOSIS — M5416 Radiculopathy, lumbar region: Secondary | ICD-10-CM | POA: Diagnosis not present

## 2021-10-14 DIAGNOSIS — M25552 Pain in left hip: Secondary | ICD-10-CM | POA: Diagnosis not present

## 2021-10-21 ENCOUNTER — Ambulatory Visit: Payer: Medicare Other | Admitting: Physical Therapy

## 2021-10-26 NOTE — Therapy (Signed)
?OUTPATIENT PHYSICAL THERAPY THORACOLUMBAR EVALUATION ? ? ?Patient Name: Shenise Wolgamott Errington ?MRN: [id number] ?DOB:[birth year], 76 y.o., female ?Today's Date: 10/27/2021 ? ? PT End of Session - 10/27/21 1108   ? ? Visit Number 1   ? Number of Visits 9   ? Date for PT Re-Evaluation 12/22/21   ? Authorization Type UHC MCR   ? Authorization Time Period FOTO v6,v10, KX mod v15   ? Progress Note Due on Visit 10   ? PT Start Time 1047   ? PT Stop Time 1130   ? PT Time Calculation (min) 43 min   ? Activity Tolerance Patient tolerated treatment well   ? Behavior During Therapy [facility] for tasks assessed/performed   ? ?  ?  ? ?  ? ? ?Past Medical History:  ?Diagnosis Date  ? Anemia   ? h/o  ? Anxiety   ? Arthritis   ? Asthma   ? as child  no problem since  ? Degenerative spondylolisthesis   ? L4-5, grade 1  ? Depression   ? Headache   ? hx migraines, tension headaches  ? HLD (hyperlipidemia)   ? Hypercholesteremia   ? Left lumbar radiculopathy   ? Lumbago   ? Lumbar degenerative disc disease   ? Lumbar spondylosis   ? Lumbar stenosis with neurogenic claudication   ? L4-5  ? OCD (obsessive compulsive disorder)   ? Osteoporosis   ? Post-nasal drip   ? Slow transit constipation   ? Spinal stenosis   ? Vitamin D deficiency   ? Weakness of both lower extremities   ? ?Past Surgical History:  ?Procedure Laterality Date  ? adnoids    ? ANTERIOR CERVICAL DECOMP/DISCECTOMY FUSION N/A 03/22/2018  ? Procedure: ANTERIOR CERVICAL DECOMPRESSION/DISCECTOMY FUSION CERVICAL FOUR - CERVICAL FIVE, CERVICAL FIVE - CERVICAL SIX, CERVICAL SIX- CERVICAL SEVEN;  Surgeon: Jovita Gamma, MD;  Location: [facility];  Service: Neurosurgery;  Laterality: N/A;  ANTERIOR CERVICAL DECOMPRESSION/DISCECTOMY FUSION CERVICAL FOUR - CERVICAL FIVE, CERVICAL FIVE - CERVICAL SIX, CERVICAL SIX- CERVICAL SEVEN  ? BACK SURGERY  2017  ? CHOLECYSTECTOMY N/A 08/15/2017  ? Procedure: LAPAROSCOPIC CHOLECYSTECTOMY;  Surgeon: Coralie Keens, MD;  Location: [facility];  Service: General;   Laterality: N/A;  ? EYE SURGERY    ? bil cataract  ? TONSILLECTOMY    ? t+a  ? ?Patient Active Problem List  ? Diagnosis Date Noted  ? Degenerative spondylolisthesis 03/07/2020  ? S/P lumbar fusion 03/06/2020  ? HNP (herniated nucleus pulposus), cervical 03/22/2018  ? S/P laparoscopic cholecystectomy 08/15/2017  ? Lumbar stenosis with neurogenic claudication 10/05/2015  ? ? ?PCP: Mayra Neer, MD ? ?REFERRING PROVIDER: Eleonore Chiquito* ? ?REFERRING DIAG: M54.16 (ICD-10-CM) - Radiculopathy, lumbar region ? ?THERAPY DIAG:  ?Other low back pain ? ?Muscle weakness (generalized) ? ?ONSET DATE: several years ago ? ?SUBJECTIVE:                                                                                                                                                                                          ? ?  SUBJECTIVE STATEMENT: ?Pt reports primary c/o chronic LBP for years that seemed to be getting better after her surgery and rehab in 2021 and afterward. However, she reports increased Rt sided lumbar "stinging" that has been getting worse over the past few months after her most recent rehab. She reports that the pain is primarily on the Rt but translates to the middle and Lt side as well. She reports that the pain is worsened by cold weather. She also reports subacute Lt hip pain related to arthritis, which she has been seen for in the past. She reports that her primary concern is her low back at this time. Pt reports that she also has N/T in her BIL Rt>Lt LE that usually doesn't travel past the knees, but can travel to the toes. This is associated with LBP exacerbation. She adds that she has a pain management appointment in May. She reports that she was adherent to her HEP to her HEP after her last rehab course, although she stopped doing her exercises over the winter. However, she reports that she has kept up with a Tai Chi class since her PT. She also reports starting up a walking program over the past  couple of weeks. Pt also reports that she knows she off-loads her Lt LE when walking or standing due to Lt hip pain, which she thinks may be increasing her LBP. Current pain is 1/10. Worst pain is 3-4/10. Best pain is 1/10. Aggravating factors include cold weather, standing 15 minutes, walking 20 minutes, and various trunk movements. Easing factors include cold packs, hot packs, rest. ? ?PERTINENT HISTORY:  ?03/06/2020: L3-L4 posterior lumbar interbody fusion, hx of anxiety, depression, osteoporosis ? ?PAIN:  ?Are you having pain? Yes: NPRS scale: 1/10 ?Pain location: BIL low back ?Pain description: Achy, stinging ?Aggravating factors: cold weather, standing 15 minutes, walking 20 minutes, and various trunk movements ?Relieving factors: cold packs, hot packs, rest ? ? ?PRECAUTIONS: None ? ?WEIGHT BEARING RESTRICTIONS No ? ?FALLS:  ?Has patient fallen in last 6 months? No ? ?LIVING ENVIRONMENT: ?Lives with: lives alone ?Lives in: House/apartment ?Stairs: Yes: External: 3 steps; on right going up and on left going up ?Has following equipment at home: [place]ile - 2 wheeled, [place] - 4 wheeled, and shower chair ? ?OCCUPATION: Retired ? ?PLOF: Independent ? ?PATIENT GOALS Doing dishes, housework, yard work, Tai Chi, increase endurance, decrease back pain ? ? ?OBJECTIVE:  ? ?DIAGNOSTIC FINDINGS:  ?None current ? ?PATIENT SURVEYS:  ?FOTO 46%, projected 55% in 12 visits ? ?SCREENING FOR RED FLAGS: ?Bowel or bladder incontinence: No ?Cauda equina syndrome: No ? ? ?COGNITION: ? Overall cognitive status: Within functional limits for tasks assessed   ?  ?SENSATION: ?Not tested ? ?MUSCLE LENGTH: ?Hamstrings: Severe limitation BIL ?Thomas test: WNL BIL ? ?POSTURE:  ?Forward posture with decreased lumbar lordosis ? ?PALPATION: ?TTP to BIL lumbar paraspinals/ QL ? ?PASSIVE ACCESSORIES: ?Hypomobile and painful L2-L4 ? ?LUMBAR ROM:  ? ?Active  AROM  ?10/27/2021  ?Flexion 50% pain  ?Extension 50% pain  ?Right lateral flexion 75%  ?Left  lateral flexion 75% pain  ?Right rotation 75% pain  ?Left rotation 75%  ? (Blank rows = not tested) ? ?LE ROM: ? ?Active  Right ?10/27/2021 Left ?10/27/2021  ?Hip flexion    ?Hip extension    ?Hip abduction    ?Hip adduction    ?Hip internal rotation    ?Hip external rotation    ? (Blank rows = not tested) ? ?LE MMT: ? ?MMT Right ?  10/27/2021 Left ?10/27/2021  ?Hip flexion 5/5 4/5  ?Hip extension 3/5 3/5  ?Hip abduction 3+/5 4/5  ? (Blank rows = not tested) ? ?LUMBAR SPECIAL TESTS:  ?PLE: (+) for severe LBP ?SLR: (+) on Rt ? ?FUNCTIONAL TESTS:  ?Plank: 2 seconds ?Squat: 75% depth ?5xSTS: 24 seconds ?Tandem stance: unable to perform ?Staggered stance: 30 seconds with minor lateral LOB without step error ? ? ? ?TODAY'S TREATMENT  ?Demonstrated and issued HEP ? ? ?PATIENT EDUCATION:  ?Education details: Pt educated on POC, prognosis, FOTO, and HEP ?Person educated: Patient ?Education method: Explanation, Demonstration, and Handouts ?Education comprehension: verbalized understanding and returned demonstration ? ? ?HOME EXERCISE PROGRAM: ?Access Code: [email or web address] ?URL: [email or web address] ?Date: 10/27/2021 ?Prepared by: Vanessa [place] ? ?Exercises ?- Plank on Knees  - 1 x daily - 7 x weekly - 3 sets - to failure hold ?- Side Plank on Knees  - 1 x daily - 7 x weekly - 3 sets - to failure hold ?- Prone Hip Extension  - 1 x daily - 7 x weekly - 3 sets - 10 reps - 3-sec hold ?- Seated Hamstring Stretch  - 1 x daily - 7 x weekly - 2-min hold ? ?ASSESSMENT: ? ?CLINICAL IMPRESSION: ?Patient is a 76 y.o. F who was seen today for physical therapy evaluation and treatment for chronic LBP.  Upon assessment, pt's primary impairments include limited and painful lumbar flexion, extension, Rt rotation, and Lt side bend, weak global hip MMT, poor tandem balance, poor 5xSTS, weak functional core strength, TTP to BIL lumbar paraspinals/ QL, hypomobile and painful L2-L4 passive accessory mobility, and tight BIL hamstrings.  Ruling up lumbar radiculopathy due to positive SLR, pt report of N/T in Rt>Lt LE. Also ruling up core weakness as a contributing factor due to poor plank measures. Ruling down spinal stenosis due to LBP being g

## 2021-10-27 ENCOUNTER — Other Ambulatory Visit: Payer: Self-pay

## 2021-10-27 ENCOUNTER — Ambulatory Visit: Payer: Medicare Other | Attending: Student

## 2021-10-27 DIAGNOSIS — M6281 Muscle weakness (generalized): Secondary | ICD-10-CM | POA: Diagnosis not present

## 2021-10-27 DIAGNOSIS — M5459 Other low back pain: Secondary | ICD-10-CM | POA: Diagnosis not present

## 2021-11-03 ENCOUNTER — Ambulatory Visit: Payer: Medicare Other

## 2021-11-03 DIAGNOSIS — M6281 Muscle weakness (generalized): Secondary | ICD-10-CM

## 2021-11-03 DIAGNOSIS — M5459 Other low back pain: Secondary | ICD-10-CM

## 2021-11-03 NOTE — Therapy (Signed)
?OUTPATIENT PHYSICAL THERAPY TREATMENT NOTE ? ? ?Patient Name: Brittany [name] ?MRN: [id number] ?DOB:1946-06-10, 76 y.o., female ?Today's Date: 11/03/2021 ? ?PCP: Mayra Neer, MD ?REFERRING PROVIDER: Mayra Neer, MD ? ?END OF SESSION:  ? PT End of Session - 11/03/21 1131   ? ? Visit Number 2   ? Number of Visits 9   ? Date for PT Re-Evaluation 12/22/21   ? Authorization Type UHC MCR   ? Authorization Time Period FOTO v6,v10, KX mod v15   ? Progress Note Due on Visit 10   ? PT Start Time [id number]   ? PT Stop Time 1210   ? PT Time Calculation (min) 39 min   ? Activity Tolerance Patient tolerated treatment well   ? Behavior During Therapy [facility] for tasks assessed/performed   ? ?  ?  ? ?  ? ? ?Past Medical History:  ?Diagnosis Date  ? Anemia   ? h/o  ? Anxiety   ? Arthritis   ? Asthma   ? as child  no problem since  ? Degenerative spondylolisthesis   ? L4-5, grade 1  ? Depression   ? Headache   ? hx migraines, tension headaches  ? HLD (hyperlipidemia)   ? Hypercholesteremia   ? Left lumbar radiculopathy   ? Lumbago   ? Lumbar degenerative disc disease   ? Lumbar spondylosis   ? Lumbar stenosis with neurogenic claudication   ? L4-5  ? OCD (obsessive compulsive disorder)   ? Osteoporosis   ? Post-nasal drip   ? Slow transit constipation   ? Spinal stenosis   ? Vitamin D deficiency   ? Weakness of both lower extremities   ? ?Past Surgical History:  ?Procedure Laterality Date  ? adnoids    ? ANTERIOR CERVICAL DECOMP/DISCECTOMY FUSION N/A 03/22/2018  ? Procedure: ANTERIOR CERVICAL DECOMPRESSION/DISCECTOMY FUSION CERVICAL FOUR - CERVICAL FIVE, CERVICAL FIVE - CERVICAL SIX, CERVICAL SIX- CERVICAL SEVEN;  Surgeon: Jovita Gamma, MD;  Location: [place];  Service: Neurosurgery;  Laterality: N/A;  ANTERIOR CERVICAL DECOMPRESSION/DISCECTOMY FUSION CERVICAL FOUR - CERVICAL FIVE, CERVICAL FIVE - CERVICAL SIX, CERVICAL SIX- CERVICAL SEVEN  ? BACK SURGERY  2017  ? CHOLECYSTECTOMY N/A 08/15/2017  ? Procedure: LAPAROSCOPIC  CHOLECYSTECTOMY;  Surgeon: Coralie Keens, MD;  Location: [place];  Service: General;  Laterality: N/A;  ? EYE SURGERY    ? bil cataract  ? TONSILLECTOMY    ? t+a  ? ?Patient Active Problem List  ? Diagnosis Date Noted  ? Degenerative spondylolisthesis 03/07/2020  ? S/P lumbar fusion 03/06/2020  ? HNP (herniated nucleus pulposus), cervical 03/22/2018  ? S/P laparoscopic cholecystectomy 08/15/2017  ? Lumbar stenosis with neurogenic claudication 10/05/2015  ? ? ?REFERRING DIAG:  ?M54.16 (ICD-10-CM) - Radiculopathy, lumbar region ? ?THERAPY DIAG:  ?Other low back pain ? ?Muscle weakness (generalized) ? ?PERTINENT HISTORY:  ?03/06/2020: L3-L4 posterior lumbar interbody fusion, hx of anxiety, depression, osteoporosis ? ?PRECAUTIONS:  ?None ? ?SUBJECTIVE:  ?Pt presents to PT with reports of lower back discomfort. She notes that the initial HEP was very difficulty and the side planks increased pain. Pt is ready to begin PT at this time.  ? ?Pain: ?Are you having pain?  ?Yes: NPRS scale: 2/10 ?Pain location: BIL low back ?Pain description: Achy, stinging ?Aggravating factors: cold weather, standing 15 minutes, walking 20 minutes, and various trunk movements ?Relieving factors: cold packs, hot packs, rest ? ? ?OBJECTIVE: (objective measures completed at initial evaluation unless otherwise dated) ? ?DIAGNOSTIC FINDINGS:  ?None current ?  ?  PATIENT SURVEYS:  ?FOTO 46%, projected 55% in 12 visits ?  ?SCREENING FOR RED FLAGS: ?Bowel or bladder incontinence: No ?Cauda equina syndrome: No ?  ?  ?COGNITION: ?          Overall cognitive status: Within functional limits for tasks assessed               ?           ?SENSATION: ?Not tested ?  ?MUSCLE LENGTH: ?Hamstrings: Severe limitation BIL ?Thomas test: WNL BIL ?  ?POSTURE:  ?Forward posture with decreased lumbar lordosis ?  ?PALPATION: ?TTP to BIL lumbar paraspinals/ QL ?  ?PASSIVE ACCESSORIES: ?Hypomobile and painful L2-L4 ?  ?LUMBAR ROM:  ?  ?Active  AROM  ?10/27/2021  ?Flexion 50%  pain  ?Extension 50% pain  ?Right lateral flexion 75%  ?Left lateral flexion 75% pain  ?Right rotation 75% pain  ?Left rotation 75%  ? (Blank rows = not tested) ?  ?LE ROM: ?  ?Active  Right ?10/27/2021 Left ?10/27/2021  ?Hip flexion      ?Hip extension      ?Hip abduction      ?Hip adduction      ?Hip internal rotation      ?Hip external rotation      ? (Blank rows = not tested) ?  ?LE MMT: ?  ?MMT Right ?10/27/2021 Left ?10/27/2021  ?Hip flexion 5/5 4/5  ?Hip extension 3/5 3/5  ?Hip abduction 3+/5 4/5  ? (Blank rows = not tested) ?  ?LUMBAR SPECIAL TESTS:  ?PLE: (+) for severe LBP ?SLR: (+) on Rt ?  ?FUNCTIONAL TESTS:  ?Plank: 2 seconds ?Squat: 75% depth ?5xSTS: 24 seconds ?Tandem stance: unable to perform ?Staggered stance: 30 seconds with minor lateral LOB without step error ?  ?  ?TODAY'S TREATMENT  ?[facility] Adult PT Treatment:                                                DATE: 11/03/2021 ?Therapeutic Exercise: ?NuStep lvl 5 UE/LE x 5 min while taking subjective ?STS 2x10 - no UE support ?Bridge 2x10  ?Supine SLR 3x10 each ?Supine 90/90 alt taps 2x10  ?90/90 table top hold 3x20" ?Standing hip abd/ext 2x10 17.5# ?  ?PATIENT EDUCATION:  ?Education details: trigger point release and HEP ?Person educated: Patient ?Education method: Explanation, Demonstration, and Handouts ?Education comprehension: verbalized understanding and returned demonstration ?  ?  ?HOME EXERCISE PROGRAM: ?Access Code: [email or web address] ?URL: [email or web address] ?Date: 11/03/2021 ?Prepared by: Octavio Manns ? ?Exercises ?- Supine Bridge  - 1 x daily - 7 x weekly - 2 sets - 10 reps ?- Supine 90/90 Abdominal Bracing  - 1 x daily - 7 x weekly - 3 reps - 20 sec hold ?- Supine 90/90 Alternating Heel Touches with Posterior Pelvic Tilt  - 1 x daily - 7 x weekly - 2 sets - 10 reps ?- Seated Hamstring Stretch  - 1 x daily - 7 x weekly - 2-min hold ?- Standing Hip Abduction with Counter Support  - 1 x daily - 7 x weekly - 3 sets - 10 reps ?- Standing  Hip Extension with Counter Support  - 1 x daily - 7 x weekly - 3 sets - 10 reps ?  ?ASSESSMENT: ?  ?CLINICAL IMPRESSION: ?Pt was able to complete all prescribed exercises with no adverse effect. Therapy today  focused on improving core and proximal hip strength in order to decrease pain and improve mobility. HEP updated for core and hip strengthening. Pt will continue to be seen and progressed as able.  ?  ?  ?OBJECTIVE IMPAIRMENTS Abnormal gait, decreased activity tolerance, decreased balance, decreased endurance, decreased mobility, difficulty walking, decreased ROM, decreased strength, hypomobility, impaired flexibility, impaired sensation, improper body mechanics, postural dysfunction, and pain.  ?  ?ACTIVITY LIMITATIONS cleaning, community activity, driving, laundry, yard work, and shopping.  ?  ?PERSONAL FACTORS Age, Time since onset of injury/illness/exacerbation, and 3+ comorbidities: See medical hx  are also affecting patient's functional outcome.  ?  ?  ?GOALS: ?Goals reviewed with patient? Yes ?  ?SHORT TERM GOALS: Target date: 11/24/2021 ?  ?Pt will report understanding and adherence to her HEP in order to promote independence in the management of her primary impairments. ?Baseline: HEP provided at eval ?Goal status: INITIAL ?  ?  ?LONG TERM GOALS: Target date: 12/22/2021 ?  ?Pt will achieve a FOTO score of 55% in order to demonstrate improved functional ability in regard to her back problems. ?Baseline: 46% ?Goal status: INITIAL ?  ?2.  Pt will achieve WNL lumbar ROM in all planes with 0-2/10 pain in order to get dressed with less limitation.  ?Baseline: See ROM chart, 3-4/10 pain ?Goal status: INITIAL ?  ?3.  Pt will achieve BIL global hip strength of 4+/5 or greater in order to return to simple hikes with less limitation. ?Baseline: See MMT chart ?Goal status: INITIAL ?  ?4.  Pt will achieve a 5xSTS of 14 seconds or less in order to demonstrate improved functional mobility needed for safe community  ambulation/ transfers. ?Baseline: 24 seconds ?Goal status: INITIAL ?  ?  ?PLAN: ?PT FREQUENCY: 1x/week ?  ?PT DURATION: 8 weeks ?  ?PLANNED INTERVENTIONS: Therapeutic exercises, Therapeutic activity, Neuromuscular re-ed

## 2021-11-10 ENCOUNTER — Ambulatory Visit: Payer: Medicare Other

## 2021-11-13 NOTE — Therapy (Signed)
?OUTPATIENT PHYSICAL THERAPY TREATMENT NOTE ? ? ?Patient Name: Brittany [name] ?MRN: [id number] ?DOB:May 02, 1946, 76 y.o., female ?Today's Date: 11/16/2021 ? ?PCP: Mayra Neer, MD ?REFERRING PROVIDER: Eleonore Chiquito* ? ?END OF SESSION:  ? PT End of Session - 11/16/21 1133   ? ? Visit Number 3   ? Number of Visits 9   ? Date for PT Re-Evaluation 12/22/21   ? Authorization Type UHC MCR   ? Authorization Time Period FOTO v6,v10, KX mod v15   ? Progress Note Due on Visit 10   ? PT Start Time 1133   ? PT Stop Time 1213   ? PT Time Calculation (min) 40 min   ? Activity Tolerance Patient tolerated treatment well   ? Behavior During Therapy [facility] for tasks assessed/performed   ? ?  ?  ? ?  ? ? ? ?Past Medical History:  ?Diagnosis Date  ? Anemia   ? h/o  ? Anxiety   ? Arthritis   ? Asthma   ? as child  no problem since  ? Degenerative spondylolisthesis   ? L4-5, grade 1  ? Depression   ? Headache   ? hx migraines, tension headaches  ? HLD (hyperlipidemia)   ? Hypercholesteremia   ? Left lumbar radiculopathy   ? Lumbago   ? Lumbar degenerative disc disease   ? Lumbar spondylosis   ? Lumbar stenosis with neurogenic claudication   ? L4-5  ? OCD (obsessive compulsive disorder)   ? Osteoporosis   ? Post-nasal drip   ? Slow transit constipation   ? Spinal stenosis   ? Vitamin D deficiency   ? Weakness of both lower extremities   ? ?Past Surgical History:  ?Procedure Laterality Date  ? adnoids    ? ANTERIOR CERVICAL DECOMP/DISCECTOMY FUSION N/A 03/22/2018  ? Procedure: ANTERIOR CERVICAL DECOMPRESSION/DISCECTOMY FUSION CERVICAL FOUR - CERVICAL FIVE, CERVICAL FIVE - CERVICAL SIX, CERVICAL SIX- CERVICAL SEVEN;  Surgeon: Jovita Gamma, MD;  Location: [place];  Service: Neurosurgery;  Laterality: N/A;  ANTERIOR CERVICAL DECOMPRESSION/DISCECTOMY FUSION CERVICAL FOUR - CERVICAL FIVE, CERVICAL FIVE - CERVICAL SIX, CERVICAL SIX- CERVICAL SEVEN  ? BACK SURGERY  2017  ? CHOLECYSTECTOMY N/A 08/15/2017  ? Procedure: LAPAROSCOPIC  CHOLECYSTECTOMY;  Surgeon: Coralie Keens, MD;  Location: [place];  Service: General;  Laterality: N/A;  ? EYE SURGERY    ? bil cataract  ? TONSILLECTOMY    ? t+a  ? ?Patient Active Problem List  ? Diagnosis Date Noted  ? Degenerative spondylolisthesis 03/07/2020  ? S/P lumbar fusion 03/06/2020  ? HNP (herniated nucleus pulposus), cervical 03/22/2018  ? S/P laparoscopic cholecystectomy 08/15/2017  ? Lumbar stenosis with neurogenic claudication 10/05/2015  ? ? ?REFERRING DIAG:  ?M54.16 (ICD-10-CM) - Radiculopathy, lumbar region ? ?THERAPY DIAG:  ?Other low back pain ? ?Muscle weakness (generalized) ? ?PERTINENT HISTORY:  ?03/06/2020: L3-L4 posterior lumbar interbody fusion, hx of anxiety, depression, osteoporosis ? ?PRECAUTIONS:  ?None ? ?SUBJECTIVE:  ?Pt reports that overall, she feels like she is feeling better, rating her pain as 0-1/10. She reports that she likes all her exercises that she is doing at home. ? ?Pain: ?Are you having pain?  ?Yes: NPRS scale: 0-1/10 ?Pain location: BIL low back ?Pain description: Achy, stinging ?Aggravating factors: cold weather, standing 15 minutes, walking 20 minutes, and various trunk movements ?Relieving factors: cold packs, hot packs, rest ? ? ?OBJECTIVE: (objective measures completed at initial evaluation unless otherwise dated) ? ?DIAGNOSTIC FINDINGS:  ?None current ?  ?PATIENT SURVEYS:  ?FOTO  46%, projected 55% in 12 visits ?  ?SCREENING FOR RED FLAGS: ?Bowel or bladder incontinence: No ?Cauda equina syndrome: No ?  ?  ?COGNITION: ?          Overall cognitive status: Within functional limits for tasks assessed               ?           ?SENSATION: ?Not tested ?  ?MUSCLE LENGTH: ?Hamstrings: Severe limitation BIL ?Thomas test: WNL BIL ?  ?POSTURE:  ?Forward posture with decreased lumbar lordosis ?  ?PALPATION: ?TTP to BIL lumbar paraspinals/ QL ?  ?PASSIVE ACCESSORIES: ?Hypomobile and painful L2-L4 ?  ?LUMBAR ROM:  ?  ?Active  AROM  ?10/27/2021  ?Flexion 50% pain  ?Extension  50% pain  ?Right lateral flexion 75%  ?Left lateral flexion 75% pain  ?Right rotation 75% pain  ?Left rotation 75%  ? (Blank rows = not tested) ?  ?LE ROM: ?  ?Active  Right ?10/27/2021 Left ?10/27/2021  ?Hip flexion      ?Hip extension      ?Hip abduction      ?Hip adduction      ?Hip internal rotation      ?Hip external rotation      ? (Blank rows = not tested) ?  ?LE MMT: ?  ?MMT Right ?10/27/2021 Left ?10/27/2021  ?Hip flexion 5/5 4/5  ?Hip extension 3/5 3/5  ?Hip abduction 3+/5 4/5  ? (Blank rows = not tested) ?  ?LUMBAR SPECIAL TESTS:  ?PLE: (+) for severe LBP ?SLR: (+) on Rt ?  ?FUNCTIONAL TESTS:  ?Plank: 2 seconds ?Squat: 75% depth ?5xSTS: 24 seconds ?Tandem stance: unable to perform ?Staggered stance: 30 seconds with minor lateral LOB without step error ?  ?  ?TODAY'S TREATMENT  ? ?[facility] Adult PT Treatment:                                                DATE: 11/16/2021 ?Therapeutic Exercise: ?Standing hip extension with 7# cable 2x10 BIL ?Standing hamstring curl with 7# cable 2x10 BIL ?Standing hip abduction with 7# cable 2x10 BIL ?Standing abdominal press-down with 20# cable 2x10 ?Standing Pallof press with 7# cable 2x10 with 5-sec hold BIL ?Standing open book stretch 2x10 BIL ?Standing IT band stretch x27min BIL ?Manual Therapy: ?N/A ?Neuromuscular re-ed: ?N/A ?Therapeutic Activity: ?N/A ?Modalities: ?N/A ?Self Care: ?N/A ? ? ?[facility] Adult PT Treatment:                                                DATE: 11/03/2021 ?Therapeutic Exercise: ?NuStep lvl 5 UE/LE x 5 min while taking subjective ?STS 2x10 - no UE support ?Bridge 2x10  ?Supine SLR 3x10 each ?Supine 90/90 alt taps 2x10  ?90/90 table top hold 3x20" ?Standing hip abd/ext 2x10 17.5# ?  ?PATIENT EDUCATION:  ?Education details: trigger point release and HEP ?Person educated: Patient ?Education method: Explanation, Demonstration, and Handouts ?Education comprehension: verbalized understanding and returned demonstration ?  ?  ?HOME EXERCISE PROGRAM: ?Access Code:  [email or web address] ?URL: [email or web address] ?Date: 11/03/2021 ?Prepared by: Octavio Manns ? ?Exercises ?- Supine Bridge  - 1 x daily - 7 x weekly - 2 sets - 10 reps ?- Supine 90/90 Abdominal Bracing  -  1 x daily - 7 x weekly - 3 reps - 20 sec hold ?- Supine 90/90 Alternating Heel Touches with Posterior Pelvic Tilt  - 1 x daily - 7 x weekly - 2 sets - 10 reps ?- Seated Hamstring Stretch  - 1 x daily - 7 x weekly - 2-min hold ?- Standing Hip Abduction with Counter Support  - 1 x daily - 7 x weekly - 3 sets - 10 reps ?- Standing Hip Extension with Counter Support  - 1 x daily - 7 x weekly - 3 sets - 10 reps ?  ?ASSESSMENT: ?  ?CLINICAL IMPRESSION: ?Pt responded well to all interventions today, demonstrating good form with verbal cues and no increase in pain with progressed core and hip exercises. She will continue to benefit from skilled PT to address her primary impairments and return to her prior level of function with less limitation. ?  ?  ?OBJECTIVE IMPAIRMENTS Abnormal gait, decreased activity tolerance, decreased balance, decreased endurance, decreased mobility, difficulty walking, decreased ROM, decreased strength, hypomobility, impaired flexibility, impaired sensation, improper body mechanics, postural dysfunction, and pain.  ?  ?ACTIVITY LIMITATIONS cleaning, community activity, driving, laundry, yard work, and shopping.  ?  ?PERSONAL FACTORS Age, Time since onset of injury/illness/exacerbation, and 3+ comorbidities: See medical hx  are also affecting patient's functional outcome.  ?  ?  ?GOALS: ?Goals reviewed with patient? Yes ?  ?SHORT TERM GOALS: Target date: 11/24/2021 ?  ?Pt will report understanding and adherence to her HEP in order to promote independence in the management of her primary impairments. ?Baseline: HEP provided at eval ?Goal status: INITIAL ?  ?  ?LONG TERM GOALS: Target date: 12/22/2021 ?  ?Pt will achieve a FOTO score of 55% in order to demonstrate improved functional ability in  regard to her back problems. ?Baseline: 46% ?Goal status: INITIAL ?  ?2.  Pt will achieve WNL lumbar ROM in all planes with 0-2/10 pain in order to get dressed with less limitation.  ?Baseline: See ROM chart, 3-

## 2021-11-15 DIAGNOSIS — M150 Primary generalized (osteo)arthritis: Secondary | ICD-10-CM | POA: Diagnosis not present

## 2021-11-15 DIAGNOSIS — Z1231 Encounter for screening mammogram for malignant neoplasm of breast: Secondary | ICD-10-CM | POA: Diagnosis not present

## 2021-11-15 DIAGNOSIS — M810 Age-related osteoporosis without current pathological fracture: Secondary | ICD-10-CM | POA: Diagnosis not present

## 2021-11-15 DIAGNOSIS — M85852 Other specified disorders of bone density and structure, left thigh: Secondary | ICD-10-CM | POA: Diagnosis not present

## 2021-11-15 DIAGNOSIS — G4700 Insomnia, unspecified: Secondary | ICD-10-CM | POA: Diagnosis not present

## 2021-11-15 DIAGNOSIS — K5900 Constipation, unspecified: Secondary | ICD-10-CM | POA: Diagnosis not present

## 2021-11-15 DIAGNOSIS — E782 Mixed hyperlipidemia: Secondary | ICD-10-CM | POA: Diagnosis not present

## 2021-11-15 DIAGNOSIS — M4800 Spinal stenosis, site unspecified: Secondary | ICD-10-CM | POA: Diagnosis not present

## 2021-11-15 DIAGNOSIS — Z0000 Encounter for general adult medical examination without abnormal findings: Secondary | ICD-10-CM | POA: Diagnosis not present

## 2021-11-15 DIAGNOSIS — Z780 Asymptomatic menopausal state: Secondary | ICD-10-CM | POA: Diagnosis not present

## 2021-11-15 DIAGNOSIS — I700 Atherosclerosis of aorta: Secondary | ICD-10-CM | POA: Diagnosis not present

## 2021-11-16 ENCOUNTER — Ambulatory Visit: Payer: Medicare Other | Attending: Student

## 2021-11-16 DIAGNOSIS — M6281 Muscle weakness (generalized): Secondary | ICD-10-CM | POA: Insufficient documentation

## 2021-11-16 DIAGNOSIS — G8929 Other chronic pain: Secondary | ICD-10-CM | POA: Insufficient documentation

## 2021-11-16 DIAGNOSIS — R262 Difficulty in walking, not elsewhere classified: Secondary | ICD-10-CM | POA: Insufficient documentation

## 2021-11-16 DIAGNOSIS — M5459 Other low back pain: Secondary | ICD-10-CM | POA: Insufficient documentation

## 2021-11-16 DIAGNOSIS — M5442 Lumbago with sciatica, left side: Secondary | ICD-10-CM | POA: Diagnosis not present

## 2021-11-17 DIAGNOSIS — M5136 Other intervertebral disc degeneration, lumbar region: Secondary | ICD-10-CM | POA: Diagnosis not present

## 2021-11-17 DIAGNOSIS — M5416 Radiculopathy, lumbar region: Secondary | ICD-10-CM | POA: Diagnosis not present

## 2021-11-22 ENCOUNTER — Ambulatory Visit: Payer: Medicare Other | Admitting: Podiatry

## 2021-11-22 ENCOUNTER — Encounter: Payer: Self-pay | Admitting: Podiatry

## 2021-11-22 DIAGNOSIS — L989 Disorder of the skin and subcutaneous tissue, unspecified: Secondary | ICD-10-CM

## 2021-11-22 DIAGNOSIS — M79674 Pain in right toe(s): Secondary | ICD-10-CM | POA: Diagnosis not present

## 2021-11-22 DIAGNOSIS — M79675 Pain in left toe(s): Secondary | ICD-10-CM

## 2021-11-22 DIAGNOSIS — B351 Tinea unguium: Secondary | ICD-10-CM | POA: Diagnosis not present

## 2021-11-22 NOTE — Progress Notes (Signed)
This patient presents to the office with chief complaint of long thick painful nails.  Patient says the nails are painful walking and wearing shoes.  This patient is unable to self treat.  This patient is unable to trim her nails since she is unable to reach her nails. She also has callus pain both forefeet.   She presents to the office for preventative foot care services. ? ?General Appearance  Alert, conversant and in no acute stress. ? ?Vascular  Dorsalis pedis and posterior tibial  pulses are palpable  bilaterally.  Capillary return is within normal limits  bilaterally. Temperature is within normal limits  bilaterally. ? ?Neurologic  Senn-Weinstein monofilament wire test within normal limits  bilaterally. Muscle power within normal limits bilaterally. ? ?Nails Thick disfigured discolored nails with subungual debris  from hallux to fifth toes bilaterally. No evidence of bacterial infection or drainage bilaterally. ? ?Orthopedic  No limitations of motion  feet .  No crepitus or effusions noted.  No bony pathology or digital deformities noted.  HAV  left foot. ? ?Skin  normotropic skin with no porokeratosis noted bilaterally.  No signs of infections or ulcers noted.   Callus sub 2  B/L. ? ?Onychomycosis  Nails  B/L.  Pain in right toes  Pain in left toes ? ?Debridement of nails both feet followed trimming the nails with dremel tool.    RTC 3 months. ? ? ?Helane Gunther DPM   ?

## 2021-11-23 NOTE — Therapy (Signed)
?OUTPATIENT PHYSICAL THERAPY TREATMENT NOTE ? ? ?Patient Name: Brittany [name] ?MRN: [id number] ?DOB:[birth year], 76 y.o., female ?Today's Date: 11/24/2021 ? ?PCP: Mayra Neer, MD ?REFERRING PROVIDER: Eleonore Chiquito* ? ?END OF SESSION:  ? PT End of Session - 11/24/21 1143   ? ? Visit Number 4   ? Number of Visits 9   ? Date for PT Re-Evaluation 12/22/21   ? Authorization Type UHC MCR   ? Authorization Time Period FOTO v6,v10, KX mod v15   ? Progress Note Due on Visit 10   ? PT Start Time 1143   ? PT Stop Time 1221   ? PT Time Calculation (min) 38 min   ? Activity Tolerance Patient tolerated treatment well   ? Behavior During Therapy [facility] for tasks assessed/performed   ? ?  ?  ? ?  ? ? ? ? ?Past Medical History:  ?Diagnosis Date  ? Anemia   ? h/o  ? Anxiety   ? Arthritis   ? Asthma   ? as child  no problem since  ? Degenerative spondylolisthesis   ? L4-5, grade 1  ? Depression   ? Headache   ? hx migraines, tension headaches  ? HLD (hyperlipidemia)   ? Hypercholesteremia   ? Left lumbar radiculopathy   ? Lumbago   ? Lumbar degenerative disc disease   ? Lumbar spondylosis   ? Lumbar stenosis with neurogenic claudication   ? L4-5  ? OCD (obsessive compulsive disorder)   ? Osteoporosis   ? Post-nasal drip   ? Slow transit constipation   ? Spinal stenosis   ? Vitamin D deficiency   ? Weakness of both lower extremities   ? ?Past Surgical History:  ?Procedure Laterality Date  ? adnoids    ? ANTERIOR CERVICAL DECOMP/DISCECTOMY FUSION N/A 03/22/2018  ? Procedure: ANTERIOR CERVICAL DECOMPRESSION/DISCECTOMY FUSION CERVICAL FOUR - CERVICAL FIVE, CERVICAL FIVE - CERVICAL SIX, CERVICAL SIX- CERVICAL SEVEN;  Surgeon: Jovita Gamma, MD;  Location: [place];  Service: Neurosurgery;  Laterality: N/A;  ANTERIOR CERVICAL DECOMPRESSION/DISCECTOMY FUSION CERVICAL FOUR - CERVICAL FIVE, CERVICAL FIVE - CERVICAL SIX, CERVICAL SIX- CERVICAL SEVEN  ? BACK SURGERY  2017  ? CHOLECYSTECTOMY N/A 08/15/2017  ? Procedure: LAPAROSCOPIC  CHOLECYSTECTOMY;  Surgeon: Coralie Keens, MD;  Location: [place];  Service: General;  Laterality: N/A;  ? EYE SURGERY    ? bil cataract  ? TONSILLECTOMY    ? t+a  ? ?Patient Active Problem List  ? Diagnosis Date Noted  ? Degenerative spondylolisthesis 03/07/2020  ? S/P lumbar fusion 03/06/2020  ? HNP (herniated nucleus pulposus), cervical 03/22/2018  ? S/P laparoscopic cholecystectomy 08/15/2017  ? Lumbar stenosis with neurogenic claudication 10/05/2015  ? ? ?REFERRING DIAG:  ?M54.16 (ICD-10-CM) - Radiculopathy, lumbar region ? ?THERAPY DIAG:  ?Other low back pain ? ?Muscle weakness (generalized) ? ?PERTINENT HISTORY:  ?03/06/2020: L3-L4 posterior lumbar interbody fusion, hx of anxiety, depression, osteoporosis ? ?PRECAUTIONS:  ?None ? ?SUBJECTIVE:  ?Pt reports her back has been feeling better, adding that she has been able to mop her kitchen floor with no pain. She also reports that the weather has not been bothering her pain anymore. She reports doing her HEP regularly.  ? ?Pain: ?Are you having pain?  ?Yes: NPRS scale: 0-1/10 ?Pain location: BIL low back ?Pain description: Achy, stinging ?Aggravating factors: cold weather, standing 15 minutes, walking 20 minutes, and various trunk movements ?Relieving factors: cold packs, hot packs, rest ? ? ?OBJECTIVE: (objective measures completed at initial evaluation unless otherwise  dated) ? ?DIAGNOSTIC FINDINGS:  ?None current ?  ?PATIENT SURVEYS:  ?FOTO 46%, projected 55% in 12 visits ?  ?SCREENING FOR RED FLAGS: ?Bowel or bladder incontinence: No ?Cauda equina syndrome: No ?  ?  ?COGNITION: ?          Overall cognitive status: Within functional limits for tasks assessed               ?           ?SENSATION: ?Not tested ?  ?MUSCLE LENGTH: ?Hamstrings: Severe limitation BIL ?Thomas test: WNL BIL ?  ?POSTURE:  ?Forward posture with decreased lumbar lordosis ?  ?PALPATION: ?TTP to BIL lumbar paraspinals/ QL ?  ?PASSIVE ACCESSORIES: ?Hypomobile and painful L2-L4 ?  ?LUMBAR  ROM:  ?  ?Active  AROM  ?10/27/2021  ?Flexion 50% pain  ?Extension 50% pain  ?Right lateral flexion 75%  ?Left lateral flexion 75% pain  ?Right rotation 75% pain  ?Left rotation 75%  ? (Blank rows = not tested) ?  ?LE ROM: ?  ?Active  Right ?10/27/2021 Left ?10/27/2021  ?Hip flexion      ?Hip extension      ?Hip abduction      ?Hip adduction      ?Hip internal rotation      ?Hip external rotation      ? (Blank rows = not tested) ?  ?LE MMT: ?  ?MMT Right ?10/27/2021 Left ?10/27/2021  ?Hip flexion 5/5 4/5  ?Hip extension 3/5 3/5  ?Hip abduction 3+/5 4/5  ? (Blank rows = not tested) ?  ?LUMBAR SPECIAL TESTS:  ?PLE: (+) for severe LBP ?SLR: (+) on Rt ?  ?FUNCTIONAL TESTS:  ?Plank: 2 seconds ?Squat: 75% depth ?5xSTS: 24 seconds ?Tandem stance: unable to perform ?Staggered stance: 30 seconds with minor lateral LOB without step error ?  ?  ?TODAY'S TREATMENT  ? ?[facility] Adult PT Treatment:                                                DATE: 11/24/2021 ?Therapeutic Exercise: ?Sidelying lumbar open book x10 BIL ?Bridge isometric with marching 5x10 ?Squat to table with alternating side step x5 down and back length of table ?Seated [place] Twist at [employer] machine 3x10 BIL ?Hooklying reverse crunches with straight arms holding GTB 3x12 ?Manual Therapy: ?N/A ?Neuromuscular re-ed: ?N/A ?Therapeutic Activity: ?N/A ?Modalities: ?N/A ?Self Care: ?N/A ? ? ?[place] Adult PT Treatment:                                                DATE: 11/16/2021 ?Therapeutic Exercise: ?Standing hip extension with 7# cable 2x10 BIL ?Standing hamstring curl with 7# cable 2x10 BIL ?Standing hip abduction with 7# cable 2x10 BIL ?Standing abdominal press-down with 20# cable 2x10 ?Standing Pallof press with 7# cable 2x10 with 5-sec hold BIL ?Standing open book stretch 2x10 BIL ?Standing IT band stretch x4min BIL ?Manual Therapy: ?N/A ?Neuromuscular re-ed: ?N/A ?Therapeutic Activity: ?N/A ?Modalities: ?N/A ?Self Care: ?N/A ? ? ?[place] Adult PT Treatment:  DATE: 11/03/2021 ?Therapeutic Exercise: ?NuStep lvl 5 UE/LE x 5 min while taking subjective ?STS 2x10 - no UE support ?Bridge 2x10  ?Supine SLR 3x10 each ?Supine 90/90 alt taps 2x10  ?90/90 table top hold 3x20" ?Standing hip abd/ext 2x10 17.5# ?  ?PATIENT EDUCATION:  ?Education details: trigger point release and HEP ?Person educated: Patient ?Education method: Explanation, Demonstration, and Handouts ?Education comprehension: verbalized understanding and returned demonstration ?  ?  ?HOME EXERCISE PROGRAM: ?Access Code: [email or web address] ?URL: [email or web address] ?Date: 11/03/2021 ?Prepared by: Octavio Manns ? ?Exercises ?- Supine Bridge  - 1 x daily - 7 x weekly - 2 sets - 10 reps ?- Supine 90/90 Abdominal Bracing  - 1 x daily - 7 x weekly - 3 reps - 20 sec hold ?- Supine 90/90 Alternating Heel Touches with Posterior Pelvic Tilt  - 1 x daily - 7 x weekly - 2 sets - 10 reps ?- Seated Hamstring Stretch  - 1 x daily - 7 x weekly - 2-min hold ?- Standing Hip Abduction with Counter Support  - 1 x daily - 7 x weekly - 3 sets - 10 reps ?- Standing Hip Extension with Counter Support  - 1 x daily - 7 x weekly - 3 sets - 10 reps ?  ?ASSESSMENT: ?  ?CLINICAL IMPRESSION: ?Pt arrived 12 minutes late to her appointment, which led to a truncated session today. She continues to report improved pain symptoms since starting PT. She responded well to all interventions today with good form and without increase in pain. Pt will continue to benefit from skilled PT to address her primary impairments and return to her prior level of function with less limitation. ?  ?  ?OBJECTIVE IMPAIRMENTS Abnormal gait, decreased activity tolerance, decreased balance, decreased endurance, decreased mobility, difficulty walking, decreased ROM, decreased strength, hypomobility, impaired flexibility, impaired sensation, improper body mechanics, postural dysfunction, and pain.  ?  ?ACTIVITY LIMITATIONS cleaning, community  activity, driving, laundry, yard work, and shopping.  ?  ?PERSONAL FACTORS Age, Time since onset of injury/illness/exacerbation, and 3+ comorbidities: See medical hx  are also affecting patient's functional

## 2021-11-24 ENCOUNTER — Ambulatory Visit: Payer: Medicare Other

## 2021-11-24 DIAGNOSIS — M5459 Other low back pain: Secondary | ICD-10-CM | POA: Diagnosis not present

## 2021-11-24 DIAGNOSIS — M6281 Muscle weakness (generalized): Secondary | ICD-10-CM

## 2021-11-24 DIAGNOSIS — R262 Difficulty in walking, not elsewhere classified: Secondary | ICD-10-CM | POA: Diagnosis not present

## 2021-11-24 DIAGNOSIS — G8929 Other chronic pain: Secondary | ICD-10-CM | POA: Diagnosis not present

## 2021-11-24 DIAGNOSIS — M5442 Lumbago with sciatica, left side: Secondary | ICD-10-CM | POA: Diagnosis not present

## 2021-12-01 ENCOUNTER — Ambulatory Visit: Payer: Medicare Other

## 2021-12-01 DIAGNOSIS — M6281 Muscle weakness (generalized): Secondary | ICD-10-CM

## 2021-12-01 DIAGNOSIS — G8929 Other chronic pain: Secondary | ICD-10-CM

## 2021-12-01 DIAGNOSIS — R262 Difficulty in walking, not elsewhere classified: Secondary | ICD-10-CM

## 2021-12-01 DIAGNOSIS — M5459 Other low back pain: Secondary | ICD-10-CM | POA: Diagnosis not present

## 2021-12-01 DIAGNOSIS — M5442 Lumbago with sciatica, left side: Secondary | ICD-10-CM | POA: Diagnosis not present

## 2021-12-01 NOTE — Therapy (Signed)
OUTPATIENT PHYSICAL THERAPY TREATMENT NOTE   Patient Name: Brittany [name] MRN: [id number] DOB:[birth year], 76 y.o., female 10 Date: 12/01/2021  PCP: Mayra Neer, MD REFERRING PROVIDER: Mayra Neer, MD  END OF SESSION:   PT End of Session - 12/01/21 1310     Visit Number 5    Number of Visits 9    Date for PT Re-Evaluation 12/22/21    Authorization Type UHC MCR    Authorization Time Period FOTO v6,v10, KX mod v15    Progress Note Due on Visit 10    PT Start Time 1305    PT Stop Time 1345    PT Time Calculation (min) 40 min    Activity Tolerance Patient tolerated treatment well    Behavior During Therapy WFL for tasks assessed/performed                Past Medical History:  Diagnosis Date   Anemia    h/o   Anxiety    Arthritis    Asthma    as child  no problem since   Degenerative spondylolisthesis    L4-5, grade 1   Depression    Headache    hx migraines, tension headaches   HLD (hyperlipidemia)    Hypercholesteremia    Left lumbar radiculopathy    Lumbago    Lumbar degenerative disc disease    Lumbar spondylosis    Lumbar stenosis with neurogenic claudication    L4-5   OCD (obsessive compulsive disorder)    Osteoporosis    Post-nasal drip    Slow transit constipation    Spinal stenosis    Vitamin D deficiency    Weakness of both lower extremities    Past Surgical History:  Procedure Laterality Date   adnoids     ANTERIOR CERVICAL DECOMP/DISCECTOMY FUSION N/A 03/22/2018   Procedure: ANTERIOR CERVICAL DECOMPRESSION/DISCECTOMY FUSION CERVICAL FOUR - CERVICAL FIVE, CERVICAL FIVE - CERVICAL SIX, CERVICAL SIX- CERVICAL SEVEN;  Surgeon: Jovita Gamma, MD;  Location: [place];  Service: Neurosurgery;  Laterality: N/A;  ANTERIOR CERVICAL DECOMPRESSION/DISCECTOMY FUSION CERVICAL FOUR - CERVICAL FIVE, CERVICAL FIVE - CERVICAL SIX, CERVICAL SIX- CERVICAL SEVEN   BACK SURGERY  2017   CHOLECYSTECTOMY N/A 08/15/2017   Procedure: LAPAROSCOPIC  CHOLECYSTECTOMY;  Surgeon: Coralie Keens, MD;  Location: [place];  Service: General;  Laterality: N/A;   EYE SURGERY     bil cataract   TONSILLECTOMY     t+a   Patient Active Problem List   Diagnosis Date Noted   Degenerative spondylolisthesis 03/07/2020   S/P lumbar fusion 03/06/2020   HNP (herniated nucleus pulposus), cervical 03/22/2018   S/P laparoscopic cholecystectomy 08/15/2017   Lumbar stenosis with neurogenic claudication 10/05/2015    REFERRING DIAG:  M54.16 (ICD-10-CM) - Radiculopathy, lumbar region  THERAPY DIAG:  Other low back pain  Muscle weakness (generalized)  Chronic bilateral low back pain with left-sided sciatica  Difficulty in walking, not elsewhere classified  PERTINENT HISTORY:  03/06/2020: L3-L4 posterior lumbar interbody fusion, hx of anxiety, depression, osteoporosis  PRECAUTIONS:  None  SUBJECTIVE:  Pt reports continued improvement in her back pain, adding that she has been feeling stronger. She reports adherence to her HEP.  Pain: Are you having pain?  Yes: NPRS scale: 0-1/10 Pain location: BIL low back Pain description: Achy, stinging Aggravating factors: cold weather, standing 15 minutes, walking 20 minutes, and various trunk movements Relieving factors: cold packs, hot packs, rest   OBJECTIVE: (objective measures completed at initial evaluation unless otherwise dated)  DIAGNOSTIC FINDINGS:  None current   PATIENT SURVEYS:  FOTO 46%, projected 55% in 12 visits   SCREENING FOR RED FLAGS: Bowel or bladder incontinence: No Cauda equina syndrome: No     COGNITION:           Overall cognitive status: Within functional limits for tasks assessed                          SENSATION: Not tested   MUSCLE LENGTH: Hamstrings: Severe limitation BIL Thomas test: WNL BIL   POSTURE:  Forward posture with decreased lumbar lordosis   PALPATION: TTP to BIL lumbar paraspinals/ QL   PASSIVE ACCESSORIES: Hypomobile and painful L2-L4    LUMBAR ROM:    Active  AROM  10/27/2021  Flexion 50% pain  Extension 50% pain  Right lateral flexion 75%  Left lateral flexion 75% pain  Right rotation 75% pain  Left rotation 75%   (Blank rows = not tested)   LE ROM:   Active  Right 10/27/2021 Left 10/27/2021  Hip flexion      Hip extension      Hip abduction      Hip adduction      Hip internal rotation      Hip external rotation       (Blank rows = not tested)   LE MMT:   MMT Right 10/27/2021 Left 10/27/2021  Hip flexion 5/5 4/5  Hip extension 3/5 3/5  Hip abduction 3+/5 4/5   (Blank rows = not tested)   LUMBAR SPECIAL TESTS:  PLE: (+) for severe LBP SLR: (+) on Rt   FUNCTIONAL TESTS:  Plank: 2 seconds Squat: 75% depth 5xSTS: 24 seconds Tandem stance: unable to perform Staggered stance: 30 seconds with minor lateral LOB without step error     TODAY'S TREATMENT   [facility] Adult PT Treatment:                                                DATE: 12/01/2021 Therapeutic Exercise: NuStep x5 minutes while collecting subjective information Supine 90/90 abdominal isometric with handhold resistance 4x30 seconds LTR x20 DKTC x47min Side knee plank 2x30sec BIL Reverse crunches 3x10 Standing hip abduction with 3# cable to ankle at [employer] 2x10 BIL Standing hip extension with 3# cable to ankle at FreeMotion 2x10 BIL Manual Therapy: N/A Neuromuscular re-ed: N/A Therapeutic Activity: N/A Modalities: N/A Self Care: N/A   [facility] Adult PT Treatment:                                                DATE: 11/24/2021 Therapeutic Exercise: Sidelying lumbar open book x10 BIL Bridge isometric with marching 5x10 Squat to table with alternating side step x5 down and back length of table Seated [place] Twist at [employer] machine 3x10 BIL Hooklying reverse crunches with straight arms holding GTB 3x12 Manual Therapy: N/A Neuromuscular re-ed: N/A Therapeutic Activity: N/A Modalities: N/A Self Care: N/A   [facility] Adult PT  Treatment:  DATE: 11/16/2021 Therapeutic Exercise: Standing hip extension with 7# cable 2x10 BIL Standing hamstring curl with 7# cable 2x10 BIL Standing hip abduction with 7# cable 2x10 BIL Standing abdominal press-down with 20# cable 2x10 Standing Pallof press with 7# cable 2x10 with 5-sec hold BIL Standing open book stretch 2x10 BIL Standing IT band stretch x91min BIL Manual Therapy: N/A Neuromuscular re-ed: N/A Therapeutic Activity: N/A Modalities: N/A Self Care: N/A     PATIENT EDUCATION:  Education details: trigger point release and HEP Person educated: Patient Education method: Explanation, Demonstration, and Handouts Education comprehension: verbalized understanding and returned demonstration     HOME EXERCISE PROGRAM: Access Code: [email or web address] URL: [email or web address] Date: 11/03/2021 Prepared by: Octavio Manns  Exercises - Supine Bridge  - 1 x daily - 7 x weekly - 2 sets - 10 reps - Supine 90/90 Abdominal Bracing  - 1 x daily - 7 x weekly - 3 reps - 20 sec hold - Supine 90/90 Alternating Heel Touches with Posterior Pelvic Tilt  - 1 x daily - 7 x weekly - 2 sets - 10 reps - Seated Hamstring Stretch  - 1 x daily - 7 x weekly - 2-min hold - Standing Hip Abduction with Counter Support  - 1 x daily - 7 x weekly - 3 sets - 10 reps - Standing Hip Extension with Counter Support  - 1 x daily - 7 x weekly - 3 sets - 10 reps   ASSESSMENT:   CLINICAL IMPRESSION: Pt responded well to all interventions today, demonstrating improved form and no increase in pain with performed exercises. She will continue to benefit from skilled PT to address her primary impairments and return to her prior level of function with less limitation.      OBJECTIVE IMPAIRMENTS Abnormal gait, decreased activity tolerance, decreased balance, decreased endurance, decreased mobility, difficulty walking, decreased ROM, decreased strength,  hypomobility, impaired flexibility, impaired sensation, improper body mechanics, postural dysfunction, and pain.    ACTIVITY LIMITATIONS cleaning, community activity, driving, laundry, yard work, and shopping.    PERSONAL FACTORS Age, Time since onset of injury/illness/exacerbation, and 3+ comorbidities: See medical hx  are also affecting patient's functional outcome.      GOALS: Goals reviewed with patient? Yes   SHORT TERM GOALS: Target date: 11/24/2021   Pt will report understanding and adherence to her HEP in order to promote independence in the management of her primary impairments. Baseline: HEP provided at eval 11/24/2021: Pt reports daily adherence to her HEP Goal status: ACHIEVED     LONG TERM GOALS: Target date: 12/22/2021   Pt will achieve a FOTO score of 55% in order to demonstrate improved functional ability in regard to her back problems. Baseline: 46% Goal status: INITIAL   2.  Pt will achieve WNL lumbar ROM in all planes with 0-2/10 pain in order to get dressed with less limitation.  Baseline: See ROM chart, 3-4/10 pain Goal status: INITIAL   3.  Pt will achieve BIL global hip strength of 4+/5 or greater in order to return to simple hikes with less limitation. Baseline: See MMT chart Goal status: INITIAL   4.  Pt will achieve a 5xSTS of 14 seconds or less in order to demonstrate improved functional mobility needed for safe community ambulation/ transfers. Baseline: 24 seconds Goal status: INITIAL     PLAN: PT FREQUENCY: 1x/week   PT DURATION: 8 weeks   PLANNED INTERVENTIONS: Therapeutic exercises, Therapeutic activity, Neuromuscular re-education, Balance training, Gait training, Patient/Family education, Joint mobilization, Stair training, Orthotic/Fit  training, DME instructions, Dry Needling, Spinal mobilization, Cryotherapy, Moist heat, Taping, Vasopneumatic device, Traction, Ionotophoresis 4mg /ml Dexamethasone, and Manual therapy.   PLAN FOR NEXT SESSION:  Progress core/ LE strengthening as able    Cherie Ouch, PT 12/01/2021, 1:46 PM

## 2021-12-11 ENCOUNTER — Ambulatory Visit: Payer: Medicare Other | Attending: Student

## 2021-12-11 DIAGNOSIS — M6281 Muscle weakness (generalized): Secondary | ICD-10-CM | POA: Diagnosis not present

## 2021-12-11 DIAGNOSIS — M5459 Other low back pain: Secondary | ICD-10-CM | POA: Insufficient documentation

## 2021-12-11 DIAGNOSIS — M5442 Lumbago with sciatica, left side: Secondary | ICD-10-CM | POA: Insufficient documentation

## 2021-12-11 DIAGNOSIS — R262 Difficulty in walking, not elsewhere classified: Secondary | ICD-10-CM | POA: Insufficient documentation

## 2021-12-11 DIAGNOSIS — G8929 Other chronic pain: Secondary | ICD-10-CM | POA: Insufficient documentation

## 2021-12-11 NOTE — Therapy (Signed)
OUTPATIENT PHYSICAL THERAPY TREATMENT NOTE   Patient Name: Brittany [name] MRN: [id number] DOB:1946/04/05, 76 y.o., female Today's Date: 12/11/2021  PCP: Mayra Neer, MD REFERRING PROVIDER: Mayra Neer, MD  END OF SESSION:   PT End of Session - 12/11/21 1119     Visit Number 6    Number of Visits 9    Date for PT Re-Evaluation 12/22/21    Authorization Type U[employer] MCR    Authorization Time Period FOTO v6,v10, KX mod v15    Progress Note Due on Visit 10    PT Start Time 1120    PT Stop Time 1158    PT Time Calculation (min) 38 min    Activity Tolerance Patient tolerated treatment well    Behavior During Therapy WFL for tasks assessed/performed                 Past Medical History:  Diagnosis Date   Anemia    h/o   Anxiety    Arthritis    Asthma    as child  no problem since   Degenerative spondylolisthesis    L4-5, grade 1   Depression    Headache    hx migraines, tension headaches   HLD (hyperlipidemia)    Hypercholesteremia    Left lumbar radiculopathy    Lumbago    Lumbar degenerative disc disease    Lumbar spondylosis    Lumbar stenosis with neurogenic claudication    L4-5   OCD (obsessive compulsive disorder)    Osteoporosis    Post-nasal drip    Slow transit constipation    Spinal stenosis    Vitamin D deficiency    Weakness of both lower extremities    Past Surgical History:  Procedure Laterality Date   adnoids     ANTERIOR CERVICAL DECOMP/DISCECTOMY FUSION N/A 03/22/2018   Procedure: ANTERIOR CERVICAL DECOMPRESSION/DISCECTOMY FUSION CERVICAL FOUR - CERVICAL FIVE, CERVICAL FIVE - CERVICAL SIX, CERVICAL SIX- CERVICAL SEVEN;  Surgeon: Jovita Gamma, MD;  Location: [place];  Service: Neurosurgery;  Laterality: N/A;  ANTERIOR CERVICAL DECOMPRESSION/DISCECTOMY FUSION CERVICAL FOUR - CERVICAL FIVE, CERVICAL FIVE - CERVICAL SIX, CERVICAL SIX- CERVICAL SEVEN   BACK SURGERY  2017   CHOLECYSTECTOMY N/A 08/15/2017   Procedure: LAPAROSCOPIC  CHOLECYSTECTOMY;  Surgeon: Coralie Keens, MD;  Location: [place];  Service: General;  Laterality: N/A;   EYE SURGERY     bil cataract   TONSILLECTOMY     t+a   Patient Active Problem List   Diagnosis Date Noted   Degenerative spondylolisthesis 03/07/2020   S/P lumbar fusion 03/06/2020   HNP (herniated nucleus pulposus), cervical 03/22/2018   S/P laparoscopic cholecystectomy 08/15/2017   Lumbar stenosis with neurogenic claudication 10/05/2015    REFERRING DIAG:  M54.16 (ICD-10-CM) - Radiculopathy, lumbar region  THERAPY DIAG:  Other low back pain  Muscle weakness (generalized)  Chronic bilateral low back pain with left-sided sciatica  Difficulty in walking, not elsewhere classified  PERTINENT HISTORY:  03/06/2020: L3-L4 posterior lumbar interbody fusion, hx of anxiety, depression, osteoporosis  PRECAUTIONS:  None  SUBJECTIVE:  Pt reports that she has very little to no pain today, adding that she was able to do a lot of walking and sitting for a long car ride without any limitation. She reports that overall, she is getting better.  Pain: Are you having pain?  Yes: NPRS scale: 0-1/10 Pain location: BIL low back Pain description: Achy, stinging Aggravating factors: cold weather, standing 15 minutes, walking 20 minutes, and various trunk movements Relieving factors:  cold packs, hot packs, rest   OBJECTIVE: (objective measures completed at initial evaluation unless otherwise dated)  DIAGNOSTIC FINDINGS:  None current   PATIENT SURVEYS:  FOTO 46%, projected 55% in 12 visits  12/11/2021: 58% (Goal met)    SCREENING FOR RED FLAGS: Bowel or bladder incontinence: No Cauda equina syndrome: No     COGNITION:           Overall cognitive status: Within functional limits for tasks assessed                          SENSATION: Not tested   MUSCLE LENGTH: Hamstrings: Severe limitation BIL Thomas test: WNL BIL   POSTURE:  Forward posture with decreased lumbar  lordosis   PALPATION: TTP to BIL lumbar paraspinals/ QL   PASSIVE ACCESSORIES: Hypomobile and painful L2-L4   LUMBAR ROM:    Active  AROM  10/27/2021 AROM 12/11/2021  Flexion 50% pain 75%  Extension 50% pain 50%  Right lateral flexion 75% 75%  Left lateral flexion 75% pain 75%  Right rotation 75% pain 75%  Left rotation 75% 75%   (Blank rows = not tested)   LE ROM:   Active  Right 10/27/2021 Left 10/27/2021  Hip flexion      Hip extension      Hip abduction      Hip adduction      Hip internal rotation      Hip external rotation       (Blank rows = not tested)   LE MMT:   MMT Right 10/27/2021 Left 10/27/2021 Right 12/11/2021 Left 12/11/2021  Hip flexion 5/5 4/5 5/5 5/5  Hip extension 3/5 3/5 4+/5 4+/5  Hip abduction 3+/5 4/5 5/5 4+/5   (Blank rows = not tested)   LUMBAR SPECIAL TESTS:  PLE: (+) for severe LBP SLR: (+) on Rt   FUNCTIONAL TESTS:  Plank: 2 seconds Squat: 75% depth 5xSTS: 24 seconds Tandem stance: unable to perform Staggered stance: 30 seconds with minor lateral LOB without step error  12/11/2021:  5xSTS: 15 seconds Plank: 40 seconds      TODAY'S TREATMENT   [facility] Adult PT Treatment:                                                DATE: 12/11/2021 Therapeutic Exercise: NuStep level 5, x5 minutes while collecting subjective information and completing FOTO Standing IT band stretch x2 minutes BIL Alternating forward lunge with overhead pull with 3-7# cables 3x10 Seated trunk rotation stretch 2x10 BIL Reverse crunches 3x10 Manual Therapy: N/A Neuromuscular re-ed: N/A Therapeutic Activity: Re-assessment of objective measures with pt education Modalities: N/A Self Care: N/A   [facility] Adult PT Treatment:                                                DATE: 12/01/2021 Therapeutic Exercise: NuStep x5 minutes while collecting subjective information Supine 90/90 abdominal isometric with handhold resistance 4x30 seconds LTR x20 DKTC x82mn Side  knee plank 2x30sec BIL Reverse crunches 3x10 Standing hip abduction with 3# cable to ankle at FreeMotion 2x10 BIL Standing hip extension with 3# cable to ankle at FreeMotion 2x10 BIL Manual Therapy: N/A Neuromuscular re-ed:  N/A Therapeutic Activity: N/A Modalities: N/A Self Care: N/A   [facility] Adult PT Treatment:                                                DATE: 11/24/2021 Therapeutic Exercise: Sidelying lumbar open book x10 BIL Bridge isometric with marching 5x10 Squat to table with alternating side step x5 down and back length of table Seated [place] Twist at [employer] machine 3x10 BIL Hooklying reverse crunches with straight arms holding GTB 3x12 Manual Therapy: N/A Neuromuscular re-ed: N/A Therapeutic Activity: N/A Modalities: N/A Self Care: N/A      PATIENT EDUCATION:  Education details: trigger point release and HEP Person educated: Patient Education method: Explanation, Demonstration, and Handouts Education comprehension: verbalized understanding and returned demonstration     HOME EXERCISE PROGRAM: Access Code: [email or web address] URL: [email or web address] Date: 11/03/2021 Prepared by: Octavio Manns  Exercises - Supine Bridge  - 1 x daily - 7 x weekly - 2 sets - 10 reps - Supine 90/90 Abdominal Bracing  - 1 x daily - 7 x weekly - 3 reps - 20 sec hold - Supine 90/90 Alternating Heel Touches with Posterior Pelvic Tilt  - 1 x daily - 7 x weekly - 2 sets - 10 reps - Seated Hamstring Stretch  - 1 x daily - 7 x weekly - 2-min hold - Standing Hip Abduction with Counter Support  - 1 x daily - 7 x weekly - 3 sets - 10 reps - Standing Hip Extension with Counter Support  - 1 x daily - 7 x weekly - 3 sets - 10 reps   ASSESSMENT:   CLINICAL IMPRESSION: Pt responded well to all new interventions today, demonstrating good form and no increase in pain with performed exercises. Upon re-assessment, the pt has met several of her long-term rehab goals and is very close to  meeting others. She has made excellent progress in hip strength, functional mobility, functional core strength, lumbar mobility, and FOTO score. She will continue to benefit from skilled PT to address her primary impairments and return to her prior level of function with less limitation.     OBJECTIVE IMPAIRMENTS Abnormal gait, decreased activity tolerance, decreased balance, decreased endurance, decreased mobility, difficulty walking, decreased ROM, decreased strength, hypomobility, impaired flexibility, impaired sensation, improper body mechanics, postural dysfunction, and pain.    ACTIVITY LIMITATIONS cleaning, community activity, driving, laundry, yard work, and shopping.    PERSONAL FACTORS Age, Time since onset of injury/illness/exacerbation, and 3+ comorbidities: See medical hx  are also affecting patient's functional outcome.      GOALS: Goals reviewed with patient? Yes   SHORT TERM GOALS: Target date: 11/24/2021   Pt will report understanding and adherence to her HEP in order to promote independence in the management of her primary impairments. Baseline: HEP provided at eval 11/24/2021: Pt reports daily adherence to her HEP Goal status: ACHIEVED     LONG TERM GOALS: Target date: 12/22/2021   Pt will achieve a FOTO score of 55% in order to demonstrate improved functional ability in regard to her back problems. Baseline: 46% 12/11/2021: 58% Goal status: ACHIEVED   2.  Pt will achieve WNL lumbar ROM in all planes with 0-2/10 pain in order to get dressed with less limitation.  Baseline: See ROM chart, 3-4/10 pain 12/11/2021: See updated ROM chart Goal status: PROGRESSING   3.  Pt  will achieve BIL global hip strength of 4+/5 or greater in order to return to simple hikes with less limitation. Baseline: See MMT chart 12/11/2021: See updated MMT chart Goal status: ACHIEVED   4.  Pt will achieve a 5xSTS of 14 seconds or less in order to demonstrate improved functional mobility needed for  safe community ambulation/ transfers. Baseline: 24 seconds 12/11/2021: 15 seconds Goal status: NEARLY ACHIEVED     PLAN: PT FREQUENCY: 1x/week   PT DURATION: 8 weeks   PLANNED INTERVENTIONS: Therapeutic exercises, Therapeutic activity, Neuromuscular re-education, Balance training, Gait training, Patient/Family education, Joint mobilization, Stair training, Orthotic/Fit training, DME instructions, Dry Needling, Spinal mobilization, Cryotherapy, Moist heat, Taping, Vasopneumatic device, Traction, Ionotophoresis 88m/ml Dexamethasone, and Manual therapy.   PLAN FOR NEXT SESSION: Progress core/ LE strengthening as able    TCherie Ouch PT 12/11/2021, 11:59 AM

## 2021-12-13 DIAGNOSIS — M5416 Radiculopathy, lumbar region: Secondary | ICD-10-CM | POA: Diagnosis not present

## 2021-12-15 ENCOUNTER — Ambulatory Visit: Payer: Medicare Other

## 2021-12-15 DIAGNOSIS — M5459 Other low back pain: Secondary | ICD-10-CM | POA: Diagnosis not present

## 2021-12-15 DIAGNOSIS — M5442 Lumbago with sciatica, left side: Secondary | ICD-10-CM | POA: Diagnosis not present

## 2021-12-15 DIAGNOSIS — M6281 Muscle weakness (generalized): Secondary | ICD-10-CM

## 2021-12-15 DIAGNOSIS — R262 Difficulty in walking, not elsewhere classified: Secondary | ICD-10-CM | POA: Diagnosis not present

## 2021-12-15 DIAGNOSIS — G8929 Other chronic pain: Secondary | ICD-10-CM

## 2021-12-15 NOTE — Therapy (Signed)
OUTPATIENT PHYSICAL THERAPY TREATMENT NOTE   Patient Name: Brittany [name] MRN: [id number] DOB:19-Dec-1945, 76 y.o., female Today's Date: 12/15/2021  PCP: Mayra Neer, MD REFERRING PROVIDER: Mayra Neer, MD  END OF SESSION:   PT End of Session - 12/15/21 1236     Visit Number 7    Number of Visits 9    Date for PT Re-Evaluation 12/22/21    Authorization Type UHC MCR    Authorization Time Period FOTO v6,v10, KX mod v15    Progress Note Due on Visit 10    PT Start Time 7829    PT Stop Time 1258    PT Time Calculation (min) 23 min    Activity Tolerance Patient tolerated treatment well    Behavior During Therapy WFL for tasks assessed/performed                  Past Medical History:  Diagnosis Date   Anemia    h/o   Anxiety    Arthritis    Asthma    as child  no problem since   Degenerative spondylolisthesis    L4-5, grade 1   Depression    Headache    hx migraines, tension headaches   HLD (hyperlipidemia)    Hypercholesteremia    Left lumbar radiculopathy    Lumbago    Lumbar degenerative disc disease    Lumbar spondylosis    Lumbar stenosis with neurogenic claudication    L4-5   OCD (obsessive compulsive disorder)    Osteoporosis    Post-nasal drip    Slow transit constipation    Spinal stenosis    Vitamin D deficiency    Weakness of both lower extremities    Past Surgical History:  Procedure Laterality Date   adnoids     ANTERIOR CERVICAL DECOMP/DISCECTOMY FUSION N/A 03/22/2018   Procedure: ANTERIOR CERVICAL DECOMPRESSION/DISCECTOMY FUSION CERVICAL FOUR - CERVICAL FIVE, CERVICAL FIVE - CERVICAL SIX, CERVICAL SIX- CERVICAL SEVEN;  Surgeon: Jovita Gamma, MD;  Location: [place];  Service: Neurosurgery;  Laterality: N/A;  ANTERIOR CERVICAL DECOMPRESSION/DISCECTOMY FUSION CERVICAL FOUR - CERVICAL FIVE, CERVICAL FIVE - CERVICAL SIX, CERVICAL SIX- CERVICAL SEVEN   BACK SURGERY  2017   CHOLECYSTECTOMY N/A 08/15/2017   Procedure: LAPAROSCOPIC  CHOLECYSTECTOMY;  Surgeon: Coralie Keens, MD;  Location: [place];  Service: General;  Laterality: N/A;   EYE SURGERY     bil cataract   TONSILLECTOMY     t+a   Patient Active Problem List   Diagnosis Date Noted   Degenerative spondylolisthesis 03/07/2020   S/P lumbar fusion 03/06/2020   HNP (herniated nucleus pulposus), cervical 03/22/2018   S/P laparoscopic cholecystectomy 08/15/2017   Lumbar stenosis with neurogenic claudication 10/05/2015    REFERRING DIAG:  M54.16 (ICD-10-CM) - Radiculopathy, lumbar region  THERAPY DIAG:  Other low back pain  Muscle weakness (generalized)  Chronic bilateral low back pain with left-sided sciatica  Difficulty in walking, not elsewhere classified  PERTINENT HISTORY:  03/06/2020: L3-L4 posterior lumbar interbody fusion, hx of anxiety, depression, osteoporosis  PRECAUTIONS:  None  SUBJECTIVE:  Pt presents to PT with no current reports of pain or discomfort. Had an injection on 12/13/2021 and noted some soreness from that. She is ready to begin PT at this time.   Pain: Are you having pain?  Yes: NPRS scale: 0-1/10 Pain location: BIL low back Pain description: Achy, stinging Aggravating factors: cold weather, standing 15 minutes, walking 20 minutes, and various trunk movements Relieving factors: cold packs, hot packs, rest  OBJECTIVE: (objective measures completed at initial evaluation unless otherwise dated)  DIAGNOSTIC FINDINGS:  None current   PATIENT SURVEYS:  FOTO 46%, projected 55% in 12 visits  12/11/2021: 58% (Goal met)    SCREENING FOR RED FLAGS: Bowel or bladder incontinence: No Cauda equina syndrome: No     COGNITION:           Overall cognitive status: Within functional limits for tasks assessed                          SENSATION: Not tested   MUSCLE LENGTH: Hamstrings: Severe limitation BIL Thomas test: WNL BIL   POSTURE:  Forward posture with decreased lumbar lordosis   PALPATION: TTP to BIL lumbar  paraspinals/ QL   PASSIVE ACCESSORIES: Hypomobile and painful L2-L4   LUMBAR ROM:    Active  AROM  10/27/2021 AROM 12/11/2021  Flexion 50% pain 75%  Extension 50% pain 50%  Right lateral flexion 75% 75%  Left lateral flexion 75% pain 75%  Right rotation 75% pain 75%  Left rotation 75% 75%   (Blank rows = not tested)   LE ROM:   Active  Right 10/27/2021 Left 10/27/2021  Hip flexion      Hip extension      Hip abduction      Hip adduction      Hip internal rotation      Hip external rotation       (Blank rows = not tested)   LE MMT:   MMT Right 10/27/2021 Left 10/27/2021 Right 12/11/2021 Left 12/11/2021  Hip flexion 5/5 4/5 5/5 5/5  Hip extension 3/5 3/5 4+/5 4+/5  Hip abduction 3+/5 4/5 5/5 4+/5   (Blank rows = not tested)   LUMBAR SPECIAL TESTS:  PLE: (+) for severe LBP SLR: (+) on Rt   FUNCTIONAL TESTS:  Plank: 2 seconds Squat: 75% depth 5xSTS: 24 seconds Tandem stance: unable to perform Staggered stance: 30 seconds with minor lateral LOB without step error  12/11/2021:  5xSTS: 15 seconds Plank: 40 seconds      TODAY'S TREATMENT  [facility] Adult PT Treatment:                                                DATE: 12/15/2021 Therapeutic Exercise: Bridge with march 3x5 90/90 table top hold 3x15" 90/90 with alt heel tap 2x10 90/90 with abdominal curl up 2x10 Modified side plank x 30" each side  Deadbug 2x5 LTR x 20 DKTC x 60" Lateral walk GTB x 2 laps at counter Standing hip ext 2x10 GTB  [facility] Adult PT Treatment:                                                DATE: 12/11/2021 Therapeutic Exercise: NuStep level 5, x5 minutes while collecting subjective information and completing FOTO Standing IT band stretch x2 minutes BIL Alternating forward lunge with overhead pull with 3-7# cables 3x10 Seated trunk rotation stretch 2x10 BIL Reverse crunches 3x10 Manual Therapy: N/A Neuromuscular re-ed: N/A Therapeutic Activity: Re-assessment of objective measures with pt  education Modalities: N/A Self Care: N/A  [facility] Adult PT Treatment:  DATE: 12/01/2021 Therapeutic Exercise: NuStep x5 minutes while collecting subjective information Supine 90/90 abdominal isometric with handhold resistance 4x30 seconds LTR x20 DKTC x60mn Side knee plank 2x30sec BIL Reverse crunches 3x10 Standing hip abduction with 3# cable to ankle at FreeMotion 2x10 BIL Standing hip extension with 3# cable to ankle at FreeMotion 2x10 BIL Manual Therapy: N/A Neuromuscular re-ed: N/A Therapeutic Activity: N/A Modalities: N/A Self Care: N/A  PATIENT EDUCATION:  Education details: trigger point release and HEP Person educated: Patient Education method: Explanation, Demonstration, and Handouts Education comprehension: verbalized understanding and returned demonstration     HOME EXERCISE PROGRAM: Access Code: [email or web address]: [email or web address] Date: 11/03/2021 Prepared by: DOctavio Manns Exercises - Supine Bridge  - 1 x daily - 7 x weekly - 2 sets - 10 reps - Supine 90/90 Abdominal Bracing  - 1 x daily - 7 x weekly - 3 reps - 20 sec hold - Supine 90/90 Alternating Heel Touches with Posterior Pelvic Tilt  - 1 x daily - 7 x weekly - 2 sets - 10 reps - Seated Hamstring Stretch  - 1 x daily - 7 x weekly - 2-min hold - Standing Hip Abduction with Counter Support  - 1 x daily - 7 x weekly - 3 sets - 10 reps - Standing Hip Extension with Counter Support  - 1 x daily - 7 x weekly - 3 sets - 10 reps   ASSESSMENT:   CLINICAL IMPRESSION: Pt was able to complete all prescribed exercises with no adverse effect or increase in pain. Therapy focused on improving core and proximal hip strength in order to decrease pain and improve function. Pt has progressed well with therapy and may be a good candidate for discharge at next session which is the end of her POC.      OBJECTIVE IMPAIRMENTS Abnormal gait, decreased activity  tolerance, decreased balance, decreased endurance, decreased mobility, difficulty walking, decreased ROM, decreased strength, hypomobility, impaired flexibility, impaired sensation, improper body mechanics, postural dysfunction, and pain.    ACTIVITY LIMITATIONS cleaning, community activity, driving, laundry, yard work, and shopping.    PERSONAL FACTORS Age, Time since onset of injury/illness/exacerbation, and 3+ comorbidities: See medical hx  are also affecting patient's functional outcome.      GOALS: Goals reviewed with patient? Yes   SHORT TERM GOALS: Target date: 11/24/2021   Pt will report understanding and adherence to her HEP in order to promote independence in the management of her primary impairments. Baseline: HEP provided at eval 11/24/2021: Pt reports daily adherence to her HEP Goal status: ACHIEVED     LONG TERM GOALS: Target date: 12/22/2021   Pt will achieve a FOTO score of 55% in order to demonstrate improved functional ability in regard to her back problems. Baseline: 46% 12/11/2021: 58% Goal status: ACHIEVED   2.  Pt will achieve WNL lumbar ROM in all planes with 0-2/10 pain in order to get dressed with less limitation.  Baseline: See ROM chart, 3-4/10 pain 12/11/2021: See updated ROM chart Goal status: PROGRESSING   3.  Pt will achieve BIL global hip strength of 4+/5 or greater in order to return to simple hikes with less limitation. Baseline: See MMT chart 12/11/2021: See updated MMT chart Goal status: ACHIEVED   4.  Pt will achieve a 5xSTS of 14 seconds or less in order to demonstrate improved functional mobility needed for safe community ambulation/ transfers. Baseline: 24 seconds 12/11/2021: 15 seconds Goal status: NEARLY ACHIEVED     PLAN: PT FREQUENCY: 1x/week  PT DURATION: 8 weeks   PLANNED INTERVENTIONS: Therapeutic exercises, Therapeutic activity, Neuromuscular re-education, Balance training, Gait training, Patient/Family education, Joint mobilization,  Stair training, Orthotic/Fit training, DME instructions, Dry Needling, Spinal mobilization, Cryotherapy, Moist heat, Taping, Vasopneumatic device, Traction, Ionotophoresis 55m/ml Dexamethasone, and Manual therapy.   PLAN FOR NEXT SESSION: Progress core/ LE strengthening as able    DWard Chatters PT 12/15/2021, 1:51 PM

## 2021-12-22 ENCOUNTER — Ambulatory Visit: Payer: Medicare Other

## 2021-12-22 DIAGNOSIS — M5459 Other low back pain: Secondary | ICD-10-CM | POA: Diagnosis not present

## 2021-12-22 DIAGNOSIS — M6281 Muscle weakness (generalized): Secondary | ICD-10-CM | POA: Diagnosis not present

## 2021-12-22 DIAGNOSIS — R262 Difficulty in walking, not elsewhere classified: Secondary | ICD-10-CM | POA: Diagnosis not present

## 2021-12-22 DIAGNOSIS — M5442 Lumbago with sciatica, left side: Secondary | ICD-10-CM | POA: Diagnosis not present

## 2021-12-22 DIAGNOSIS — G8929 Other chronic pain: Secondary | ICD-10-CM | POA: Diagnosis not present

## 2021-12-22 NOTE — Therapy (Signed)
OUTPATIENT PHYSICAL THERAPY TREATMENT NOTE/ DISCHARGE SUMMARY   Patient Name: Brittany [name] MRN: [id number] DOB:Mar 05, 1946, 76 y.o., female 4 Date: 12/22/2021  PCP: Mayra Neer, MD REFERRING PROVIDER: Mayra Neer, MD  END OF SESSION:   PT End of Session - 12/22/21 1152     Visit Number 8    Number of Visits 9    Date for PT Re-Evaluation 12/22/21    Authorization Type U[employer] MCR    Authorization Time Period FOTO v6,v10, KX mod v15    Progress Note Due on Visit 10    PT Start Time 8638   Pt arrived 10 minutes late to therapy today.   PT Stop Time 1205    PT Time Calculation (min) 24 min    Activity Tolerance Patient tolerated treatment well    Behavior During Therapy WFL for tasks assessed/performed               Past Medical History:  Diagnosis Date   Anemia    h/o   Anxiety    Arthritis    Asthma    as child  no problem since   Degenerative spondylolisthesis    L4-5, grade 1   Depression    Headache    hx migraines, tension headaches   HLD (hyperlipidemia)    Hypercholesteremia    Left lumbar radiculopathy    Lumbago    Lumbar degenerative disc disease    Lumbar spondylosis    Lumbar stenosis with neurogenic claudication    L4-5   OCD (obsessive compulsive disorder)    Osteoporosis    Post-nasal drip    Slow transit constipation    Spinal stenosis    Vitamin D deficiency    Weakness of both lower extremities    Past Surgical History:  Procedure Laterality Date   adnoids     ANTERIOR CERVICAL DECOMP/DISCECTOMY FUSION N/A 03/22/2018   Procedure: ANTERIOR CERVICAL DECOMPRESSION/DISCECTOMY FUSION CERVICAL FOUR - CERVICAL FIVE, CERVICAL FIVE - CERVICAL SIX, CERVICAL SIX- CERVICAL SEVEN;  Surgeon: Jovita Gamma, MD;  Location: [facility];  Service: Neurosurgery;  Laterality: N/A;  ANTERIOR CERVICAL DECOMPRESSION/DISCECTOMY FUSION CERVICAL FOUR - CERVICAL FIVE, CERVICAL FIVE - CERVICAL SIX, CERVICAL SIX- CERVICAL SEVEN   BACK SURGERY  2017    CHOLECYSTECTOMY N/A 08/15/2017   Procedure: LAPAROSCOPIC CHOLECYSTECTOMY;  Surgeon: Coralie Keens, MD;  Location: [place];  Service: General;  Laterality: N/A;   EYE SURGERY     bil cataract   TONSILLECTOMY     t+a   Patient Active Problem List   Diagnosis Date Noted   Degenerative spondylolisthesis 03/07/2020   S/P lumbar fusion 03/06/2020   HNP (herniated nucleus pulposus), cervical 03/22/2018   S/P laparoscopic cholecystectomy 08/15/2017   Lumbar stenosis with neurogenic claudication 10/05/2015    REFERRING DIAG:  M54.16 (ICD-10-CM) - Radiculopathy, lumbar region  THERAPY DIAG:  Other low back pain  Muscle weakness (generalized)  PERTINENT HISTORY:  03/06/2020: L3-L4 posterior lumbar interbody fusion, hx of anxiety, depression, osteoporosis  PRECAUTIONS:  None  SUBJECTIVE:  Pt reports she has continued to do well, only reporting some mild Lt LBP today. She is agreeable to discharge from PT at this time.   Pain: Are you having pain?  Yes: NPRS scale: 0-1/10 Pain location: Lt low back Pain description: Achy, stinging Aggravating factors: cold weather, standing 15 minutes, walking 20 minutes, and various trunk movements Relieving factors: cold packs, hot packs, rest   OBJECTIVE: (objective measures completed at initial evaluation unless otherwise dated)  DIAGNOSTIC FINDINGS:  None current   PATIENT SURVEYS:  FOTO 46%, projected 55% in 12 visits  12/11/2021: 58% (Goal met)    SCREENING FOR RED FLAGS: Bowel or bladder incontinence: No Cauda equina syndrome: No     COGNITION:           Overall cognitive status: Within functional limits for tasks assessed                          SENSATION: Not tested   MUSCLE LENGTH: Hamstrings: Severe limitation BIL Thomas test: WNL BIL   POSTURE:  Forward posture with decreased lumbar lordosis   PALPATION: TTP to BIL lumbar paraspinals/ QL   PASSIVE ACCESSORIES: Hypomobile and painful L2-L4   LUMBAR ROM:     Active  AROM  10/27/2021 AROM 12/11/2021 AROM 12/22/2021  Flexion 50% pain 75% 75%  Extension 50% pain 50% 50%  Right lateral flexion 75% 75% 100%  Left lateral flexion 75% pain 75% 100%  Right rotation 75% pain 75% 100%  Left rotation 75% 75% 100%   (Blank rows = not tested)   LE ROM:   Active  Right 10/27/2021 Left 10/27/2021  Hip flexion      Hip extension      Hip abduction      Hip adduction      Hip internal rotation      Hip external rotation       (Blank rows = not tested)   LE MMT:   MMT Right 10/27/2021 Left 10/27/2021 Right 12/11/2021 Left 12/11/2021  Hip flexion 5/5 4/5 5/5 5/5  Hip extension 3/5 3/5 4+/5 4+/5  Hip abduction 3+/5 4/5 5/5 4+/5   (Blank rows = not tested)   LUMBAR SPECIAL TESTS:  PLE: (+) for severe LBP SLR: (+) on Rt   FUNCTIONAL TESTS:  Plank: 2 seconds Squat: 75% depth 5xSTS: 24 seconds Tandem stance: unable to perform Staggered stance: 30 seconds with minor lateral LOB without step error  12/11/2021:  5xSTS: 15 seconds Plank: 40 seconds     12/22/2021: 5xSTS: 14 seconds   TODAY'S TREATMENT   [facility] Adult PT Treatment:                                                DATE: 12/22/2021 Therapeutic Exercise: Long-arm reverse crunches 3x8 Marching bridges 3x12 Manual Therapy: N/A Neuromuscular re-ed: N/A Therapeutic Activity: Re-assessment of objective measures with pt education Dead lift with 10# kettlebell with butt tap to table 3x8 Modalities: N/A Self Care: N/A   [facility] Adult PT Treatment:                                                DATE: 12/15/2021 Therapeutic Exercise: Bridge with march 3x5 90/90 table top hold 3x15" 90/90 with alt heel tap 2x10 90/90 with abdominal curl up 2x10 Modified side plank x 30" each side  Deadbug 2x5 LTR x 20 DKTC x 60" Lateral walk GTB x 2 laps at counter Standing hip ext 2x10 GTB  [facility] Adult PT Treatment:  DATE: 12/11/2021 Therapeutic  Exercise: NuStep level 5, x5 minutes while collecting subjective information and completing FOTO Standing IT band stretch x2 minutes BIL Alternating forward lunge with overhead pull with 3-7# cables 3x10 Seated trunk rotation stretch 2x10 BIL Reverse crunches 3x10 Manual Therapy: N/A Neuromuscular re-ed: N/A Therapeutic Activity: Re-assessment of objective measures with pt education Modalities: N/A Self Care: N/A    PATIENT EDUCATION:  Education details: trigger point release and HEP Person educated: Patient Education method: Explanation, Demonstration, and Handouts Education comprehension: verbalized understanding and returned demonstration     HOME EXERCISE PROGRAM: Access Code: [email or web address] URL: [email or web address] Date: 11/03/2021 Prepared by: Octavio Manns  Exercises - Supine Bridge  - 1 x daily - 7 x weekly - 2 sets - 10 reps - Supine 90/90 Abdominal Bracing  - 1 x daily - 7 x weekly - 3 reps - 20 sec hold - Supine 90/90 Alternating Heel Touches with Posterior Pelvic Tilt  - 1 x daily - 7 x weekly - 2 sets - 10 reps - Seated Hamstring Stretch  - 1 x daily - 7 x weekly - 2-min hold - Standing Hip Abduction with Counter Support  - 1 x daily - 7 x weekly - 3 sets - 10 reps - Standing Hip Extension with Counter Support  - 1 x daily - 7 x weekly - 3 sets - 10 reps   ASSESSMENT:   CLINICAL IMPRESSION: Due to pt arriving 10 minutes late to her appointment, the session was truncated today. Upon re-assessment, pt has met all of her functional rehab goals, achieving good progress in lumbar ROM, functional mobility, and strength. She is discharged from PT at this time to continue an updated HEP.     OBJECTIVE IMPAIRMENTS Abnormal gait, decreased activity tolerance, decreased balance, decreased endurance, decreased mobility, difficulty walking, decreased ROM, decreased strength, hypomobility, impaired flexibility, impaired sensation, improper body mechanics,  postural dysfunction, and pain.    ACTIVITY LIMITATIONS cleaning, community activity, driving, laundry, yard work, and shopping.    PERSONAL FACTORS Age, Time since onset of injury/illness/exacerbation, and 3+ comorbidities: See medical hx  are also affecting patient's functional outcome.      GOALS: Goals reviewed with patient? Yes   SHORT TERM GOALS: Target date: 11/24/2021   Pt will report understanding and adherence to her HEP in order to promote independence in the management of her primary impairments. Baseline: HEP provided at eval 11/24/2021: Pt reports daily adherence to her HEP Goal status: ACHIEVED     LONG TERM GOALS: Target date: 12/22/2021   Pt will achieve a FOTO score of 55% in order to demonstrate improved functional ability in regard to her back problems. Baseline: 46% 12/11/2021: 58% Goal status: ACHIEVED   2.  Pt will achieve WNL lumbar ROM in all planes with 0-2/10 pain in order to get dressed with less limitation.  Baseline: See ROM chart, 3-4/10 pain 12/11/2021: See updated ROM chart 12/22/2021: See updated AROM chart Goal status: NEARLY ACHIEVED   3.  Pt will achieve BIL global hip strength of 4+/5 or greater in order to return to simple hikes with less limitation. Baseline: See MMT chart 12/11/2021: See updated MMT chart Goal status: ACHIEVED   4.  Pt will achieve a 5xSTS of 14 seconds or less in order to demonstrate improved functional mobility needed for safe community ambulation/ transfers. Baseline: 24 seconds 12/11/2021: 15 seconds 12/22/2021: 14 seconds Goal status: ACHIEVED     PLAN: PT FREQUENCY: 1x/week   PT DURATION: 8 weeks  PLANNED INTERVENTIONS: Therapeutic exercises, Therapeutic activity, Neuromuscular re-education, Balance training, Gait training, Patient/Family education, Joint mobilization, Stair training, Orthotic/Fit training, DME instructions, Dry Needling, Spinal mobilization, Cryotherapy, Moist heat, Taping, Vasopneumatic device,  Traction, Ionotophoresis 35m/ml Dexamethasone, and Manual therapy.   PLAN FOR NEXT SESSION: Pt is discharged at this time  PHYSICAL THERAPY DISCHARGE SUMMARY  Visits from Start of Care: 8  Current functional level related to goals / functional outcomes: Pt has met or mostly met all of her functional rehab goals.   Remaining deficits: Limited trunk flexion and extension AROM   Education / Equipment: HEP   Patient agrees to discharge. Patient goals were met. Patient is being discharged due to meeting the stated rehab goals.   YVanessa Esto PT, DPT 12/22/21 12:11 PM

## 2021-12-28 DIAGNOSIS — M6250 Muscle wasting and atrophy, not elsewhere classified, unspecified site: Secondary | ICD-10-CM | POA: Diagnosis not present

## 2022-01-12 DIAGNOSIS — M5416 Radiculopathy, lumbar region: Secondary | ICD-10-CM | POA: Diagnosis not present

## 2022-01-12 DIAGNOSIS — M5136 Other intervertebral disc degeneration, lumbar region: Secondary | ICD-10-CM | POA: Diagnosis not present

## 2022-02-23 ENCOUNTER — Ambulatory Visit: Payer: Medicare Other | Admitting: Podiatry

## 2022-02-23 ENCOUNTER — Encounter: Payer: Self-pay | Admitting: Podiatry

## 2022-02-23 DIAGNOSIS — M79674 Pain in right toe(s): Secondary | ICD-10-CM

## 2022-02-23 DIAGNOSIS — B351 Tinea unguium: Secondary | ICD-10-CM

## 2022-02-23 DIAGNOSIS — M79675 Pain in left toe(s): Secondary | ICD-10-CM

## 2022-02-23 DIAGNOSIS — M2012 Hallux valgus (acquired), left foot: Secondary | ICD-10-CM

## 2022-02-23 NOTE — Progress Notes (Signed)
This patient presents to the office with chief complaint of long thick painful nails.  Patient says the nails are painful walking and wearing shoes.  This patient is unable to self treat.  This patient is unable to trim her nails since she is unable to reach her nails. She also has callus pain both forefeet.   She presents to the office for preventative foot care services.  General Appearance  Alert, conversant and in no acute stress.  Vascular  Dorsalis pedis and posterior tibial  pulses are palpable  bilaterally.  Capillary return is within normal limits  bilaterally. Temperature is within normal limits  bilaterally.  Neurologic  Senn-Weinstein monofilament wire test within normal limits  bilaterally. Muscle power within normal limits bilaterally.  Nails Thick disfigured discolored nails with subungual debris  from hallux to fifth toes bilaterally. No evidence of bacterial infection or drainage bilaterally.  Orthopedic  No limitations of motion  feet .  No crepitus or effusions noted.  No bony pathology or digital deformities noted.  HAV  left foot.  Skin  normotropic skin with no porokeratosis noted bilaterally.  No signs of infections or ulcers noted.   Callus sub 2  B/L asymptomatic.  Onychomycosis  Nails  B/L.  Pain in right toes  Pain in left toes  Debridement of nails both feet followed trimming the nails with dremel tool.    RTC 3 months.   Helane Gunther DPM

## 2022-03-10 DIAGNOSIS — M4800 Spinal stenosis, site unspecified: Secondary | ICD-10-CM | POA: Diagnosis not present

## 2022-03-10 DIAGNOSIS — R270 Ataxia, unspecified: Secondary | ICD-10-CM | POA: Diagnosis not present

## 2022-03-18 DIAGNOSIS — M5416 Radiculopathy, lumbar region: Secondary | ICD-10-CM | POA: Diagnosis not present

## 2022-03-25 ENCOUNTER — Ambulatory Visit: Payer: Medicare Other | Attending: Family Medicine | Admitting: Physical Therapy

## 2022-03-25 ENCOUNTER — Encounter: Payer: Self-pay | Admitting: Physical Therapy

## 2022-03-25 DIAGNOSIS — R262 Difficulty in walking, not elsewhere classified: Secondary | ICD-10-CM | POA: Insufficient documentation

## 2022-03-25 DIAGNOSIS — M5459 Other low back pain: Secondary | ICD-10-CM | POA: Insufficient documentation

## 2022-03-25 DIAGNOSIS — R2689 Other abnormalities of gait and mobility: Secondary | ICD-10-CM | POA: Insufficient documentation

## 2022-03-25 DIAGNOSIS — M6281 Muscle weakness (generalized): Secondary | ICD-10-CM | POA: Insufficient documentation

## 2022-03-25 DIAGNOSIS — R2681 Unsteadiness on feet: Secondary | ICD-10-CM | POA: Insufficient documentation

## 2022-03-25 NOTE — Therapy (Signed)
OUTPATIENT PHYSICAL THERAPY NEURO EVALUATION   Patient Name: Brittany [name] MRN: [id number] DOB:1945-07-22, 76 y.o., female Today's Date: 03/25/2022   PCP: Mayra Neer, MD  REFERRING PROVIDER: Mayra Neer, MD    PT End of Session - 03/25/22 1209     Visit Number 1    Number of Visits 9    Date for PT Re-Evaluation 05/24/22    Authorization Type U[employer] Medicare    PT Start Time 1019    PT Stop Time 1100    PT Time Calculation (min) 41 min    Activity Tolerance Patient tolerated treatment well    Behavior During Therapy WFL for tasks assessed/performed   talkative            Past Medical History:  Diagnosis Date   Anemia    h/o   Anxiety    Arthritis    Asthma    as child  no problem since   Degenerative spondylolisthesis    L4-5, grade 1   Depression    Headache    hx migraines, tension headaches   HLD (hyperlipidemia)    Hypercholesteremia    Left lumbar radiculopathy    Lumbago    Lumbar degenerative disc disease    Lumbar spondylosis    Lumbar stenosis with neurogenic claudication    L4-5   OCD (obsessive compulsive disorder)    Osteoporosis    Post-nasal drip    Slow transit constipation    Spinal stenosis    Vitamin D deficiency    Weakness of both lower extremities    Past Surgical History:  Procedure Laterality Date   adnoids     ANTERIOR CERVICAL DECOMP/DISCECTOMY FUSION N/A 03/22/2018   Procedure: ANTERIOR CERVICAL DECOMPRESSION/DISCECTOMY FUSION CERVICAL FOUR - CERVICAL FIVE, CERVICAL FIVE - CERVICAL SIX, CERVICAL SIX- CERVICAL SEVEN;  Surgeon: Jovita Gamma, MD;  Location: [place];  Service: Neurosurgery;  Laterality: N/A;  ANTERIOR CERVICAL DECOMPRESSION/DISCECTOMY FUSION CERVICAL FOUR - CERVICAL FIVE, CERVICAL FIVE - CERVICAL SIX, CERVICAL SIX- CERVICAL SEVEN   BACK SURGERY  2017   CHOLECYSTECTOMY N/A 08/15/2017   Procedure: LAPAROSCOPIC CHOLECYSTECTOMY;  Surgeon: Coralie Keens, MD;  Location: [place];  Service: General;   Laterality: N/A;   EYE SURGERY     bil cataract   TONSILLECTOMY     t+a   Patient Active Problem List   Diagnosis Date Noted   Degenerative spondylolisthesis 03/07/2020   S/P lumbar fusion 03/06/2020   HNP (herniated nucleus pulposus), cervical 03/22/2018   S/P laparoscopic cholecystectomy 08/15/2017   Lumbar stenosis with neurogenic claudication 10/05/2015    ONSET DATE: 03/11/2022   REFERRING DIAG: R27.0 (ICD-10-CM) - Ataxia, unspecified   THERAPY DIAG:  Muscle weakness (generalized)  Other low back pain  Other abnormalities of gait and mobility  Unsteadiness on feet  Rationale for Evaluation and Treatment Rehabilitation  SUBJECTIVE:  SUBJECTIVE STATEMENT: Has been having trouble with her R hip being stiff in the morning. Temporarily stopped exercising to give that muscle a chance to rest. Recently had PT earlier this year. Stopped doing some exercises, but still doing some. Reports L leg has a weak ABD muscle. Also does Tai Chi for exercise and walks. Feels like her leg weakness and pain is affecting her balance. Reports feeling more off balance when she walks. No falls. Finds the more that she exercises, the less that she hurts. Has to sit on a wedge pillow.   Pt accompanied by: self  PERTINENT HISTORY: PMH: Arthritis,  L3-L4 posterior lumbar interbody fusion, hx of anxiety, depression, osteoporosis, spinal stenosis   PAIN:  Are you having pain? Yes: NPRS scale: 1/10 Pain location: Low back Pain description: "Muscle pain"  Aggravating factors: After walking, standing and doing the dishes. Moving the arms and standing Relieving factors: Exercising.   PRECAUTIONS: None  FALLS: Has patient fallen in last 6 months? No  LIVING ENVIRONMENT: Lives with: lives alone Lives in:  House/apartment Stairs: Yes: External: 2 steps; has porch/metal post Has following equipment at home: None  PLOF: Independent, used to enjoy hiking (last time was in 2016)  PATIENT GOALS Wants to improve strength of L abductor muscle. Wants L hip to relax.   OBJECTIVE:   COGNITION: Overall cognitive status: Within functional limits for tasks assessed   SENSATION: Light touch: Impaired  Pt reports numbness in BLE, felt more numbness to lateral knees.   POSTURE:  Forward posture with decreased lumbar lordosis   PALPATION:   TTP L>R piriformis, QL, bilateral lumbar paraspinals    LOWER EXTREMITY ROM:     Limited hamstring AROM in supine LLE>RLE, pt reporting incr tightness   Active  Right Eval Left Eval  Hip flexion [facility]  Hip extension    Hip abduction    Hip adduction    Hip internal rotation Slightly more limited, reports feeling some tightness Slightly more limited, reports feeling some tightness  Hip external rotation WFL, pt reports feeling a stretch WFL, pt reports feeling a stretch  Knee flexion    Knee extension    Ankle dorsiflexion    Ankle plantarflexion    Ankle inversion    Ankle eversion     (Blank rows = not tested)  LOWER EXTREMITY MMT:    MMT Right Eval Left Eval  Hip flexion 4+/5 4+/5  Hip extension 4/5 4/5  Hip abduction 4/5 3/5  Hip adduction    Hip internal rotation    Hip external rotation    Knee flexion 5/5 5/5  Knee extension 5/5 5/5  Ankle dorsiflexion 5/5 5/5  Ankle plantarflexion    Ankle inversion    Ankle eversion    (Blank rows = not tested)   GAIT: Gait pattern: WFL, step through pattern, decreased stride length, trendelenburg, and lateral hip instability Distance walked: Clinic distance.  Assistive device utilized: None Level of assistance: SBA Comments: Gait with head turns, head nods over 50'. Mild unsteadiness, more with head nods.   FUNCTIONAL TESTs:   5 times sit to stand: 17.29 seconds without UE support   30 seconds chair stand test: 8 sit <> stands with no UE support (<10 below normal for age) 10 meter walk test: 13.06 seconds = 2.51 ft/sec  SLS: LLE: 3.2 seconds with Trendelenburg, RLE: 2.5 seconds    M-CTSIB  Condition 1: Firm Surface, EO 30 Sec, Normal Sway  Condition 2: Firm Surface, EC 30 Sec,  Normal Sway  Condition 3: Foam Surface, EO 30 Sec, Mild Sway  Condition 4: Foam Surface, EC 30 Sec, Mild/Moderate Sway        TODAY'S TREATMENT:  N/A during eval.    PATIENT EDUCATION: Education details: Clinical findings, POC, areas to work on in therapy. Person educated: Patient Education method: Explanation Education comprehension: verbalized understanding   HOME EXERCISE PROGRAM: Will provide at next session    GOALS: Goals reviewed with patient? Yes  SHORT TERM GOALS:  ALL STGS = LTGS  LONG TERM GOALS: Target date: 04/22/2022  Pt will be independent with HEP for strength, ROM, balance in order to build upon functional gains made in therapy. Baseline:  Goal status: INITIAL  2.  FGA goal to be written.  Baseline:  Goal status: INITIAL  3. Pt will improve gait speed with no AD to at least 2.7 ft/sec in order to demo improved community mobility.   Baseline: 13.06 seconds = 2.51 ft/sec Goal status: INITIAL  4.  Pt will improve 30 second chair stand to at least 10 sit <> stands in order to demo improved functional strength.  Baseline: 8 sit <> stands with no UE support (<10 below normal for age) Goal status: INITIAL  5.  Pt will improve L hip ABD strength to at least a 4/5 to demo improved strength for balance/gait.  Baseline: 3/5 Goal status: INITIAL  ASSESSMENT:  CLINICAL IMPRESSION: Patient is a 76 year old female referred to Neuro OPPT for Ataxia. The following deficits were present during the exam: impaired balance, gait abnormalities, impaired sensation, tightness of hamstrings/hip muscles/low back, postural abnormalities, decr strength (LLE>RLE), decr  ROM. Based on 30 second chair stand, pt demos below functional strength for age. Pt's gait speed with no AD indicates a limited community ambulator. Will perform further balance at next session. Pt would benefit from skilled PT to address these impairments and functional limitations to maximize functional mobility independence    OBJECTIVE IMPAIRMENTS Abnormal gait, decreased activity tolerance, decreased balance, decreased mobility, difficulty walking, decreased ROM, decreased strength, hypomobility, increased fascial restrictions, impaired flexibility, impaired sensation, postural dysfunction, and pain.   ACTIVITY LIMITATIONS carrying, bending, squatting, stairs, transfers, and locomotion level  PARTICIPATION LIMITATIONS: community activity and hiking  PERSONAL FACTORS Age, Behavior pattern, Past/current experiences, Time since onset of injury/illness/exacerbation, and 3+ comorbidities:  Arthritis,  L3-L4 posterior lumbar interbody fusion, hx of anxiety, depression, osteoporosis, spinal stenosis   are also affecting patient's functional outcome.   REHAB POTENTIAL: Good  CLINICAL DECISION MAKING: Stable/uncomplicated  EVALUATION COMPLEXITY: Low  PLAN: PT FREQUENCY: 2x/week  PT DURATION: 8 weeks  PLANNED INTERVENTIONS: Therapeutic exercises, Therapeutic activity, Neuromuscular re-education, Balance training, Gait training, Patient/Family education, Self Care, Joint mobilization, Stair training, Vestibular training, DME instructions, Aquatic Therapy, Spinal mobilization, Moist heat, Manual therapy, and Re-evaluation  PLAN FOR NEXT SESSION: Perform FGA and write goal. Initial HEP for ROM, strengthening, balance. Might need to review HEP from ortho and update as needed    Arliss Journey, PT, DPT  03/25/2022, 12:13 PM

## 2022-03-28 ENCOUNTER — Ambulatory Visit: Payer: Medicare Other | Admitting: Physical Therapy

## 2022-03-28 DIAGNOSIS — R2689 Other abnormalities of gait and mobility: Secondary | ICD-10-CM | POA: Diagnosis not present

## 2022-03-28 DIAGNOSIS — M5459 Other low back pain: Secondary | ICD-10-CM | POA: Diagnosis not present

## 2022-03-28 DIAGNOSIS — R2681 Unsteadiness on feet: Secondary | ICD-10-CM

## 2022-03-28 DIAGNOSIS — M6281 Muscle weakness (generalized): Secondary | ICD-10-CM

## 2022-03-28 DIAGNOSIS — R262 Difficulty in walking, not elsewhere classified: Secondary | ICD-10-CM | POA: Diagnosis not present

## 2022-03-28 NOTE — Therapy (Signed)
OUTPATIENT PHYSICAL THERAPY NEURO EVALUATION   Patient Name: Brittany [name] MRN: [id number] DOB:1945/08/01, 76 y.o., female Today's Date: 03/28/2022   PCP: Mayra Neer, MD  REFERRING PROVIDER: Mayra Neer, MD    PT End of Session - 03/28/22 1407     Visit Number 2    Number of Visits 9    Date for PT Re-Evaluation 05/24/22    Authorization Type [employer] Medicare    PT Start Time 1405    PT Stop Time 1445    PT Time Calculation (min) 40 min    Activity Tolerance Patient tolerated treatment well    Behavior During Therapy WFL for tasks assessed/performed   talkative             Past Medical History:  Diagnosis Date   Anemia    h/o   Anxiety    Arthritis    Asthma    as child  no problem since   Degenerative spondylolisthesis    L4-5, grade 1   Depression    Headache    hx migraines, tension headaches   HLD (hyperlipidemia)    Hypercholesteremia    Left lumbar radiculopathy    Lumbago    Lumbar degenerative disc disease    Lumbar spondylosis    Lumbar stenosis with neurogenic claudication    L4-5   OCD (obsessive compulsive disorder)    Osteoporosis    Post-nasal drip    Slow transit constipation    Spinal stenosis    Vitamin D deficiency    Weakness of both lower extremities    Past Surgical History:  Procedure Laterality Date   adnoids     ANTERIOR CERVICAL DECOMP/DISCECTOMY FUSION N/A 03/22/2018   Procedure: ANTERIOR CERVICAL DECOMPRESSION/DISCECTOMY FUSION CERVICAL FOUR - CERVICAL FIVE, CERVICAL FIVE - CERVICAL SIX, CERVICAL SIX- CERVICAL SEVEN;  Surgeon: Jovita Gamma, MD;  Location: [place];  Service: Neurosurgery;  Laterality: N/A;  ANTERIOR CERVICAL DECOMPRESSION/DISCECTOMY FUSION CERVICAL FOUR - CERVICAL FIVE, CERVICAL FIVE - CERVICAL SIX, CERVICAL SIX- CERVICAL SEVEN   BACK SURGERY  2017   CHOLECYSTECTOMY N/A 08/15/2017   Procedure: LAPAROSCOPIC CHOLECYSTECTOMY;  Surgeon: Coralie Keens, MD;  Location: [place];  Service: General;   Laterality: N/A;   EYE SURGERY     bil cataract   TONSILLECTOMY     t+a   Patient Active Problem List   Diagnosis Date Noted   Degenerative spondylolisthesis 03/07/2020   S/P lumbar fusion 03/06/2020   HNP (herniated nucleus pulposus), cervical 03/22/2018   S/P laparoscopic cholecystectomy 08/15/2017   Lumbar stenosis with neurogenic claudication 10/05/2015    ONSET DATE: 03/11/2022   REFERRING DIAG: R27.0 (ICD-10-CM) - Ataxia, unspecified   THERAPY DIAG:  Muscle weakness (generalized)  Unsteadiness on feet  Difficulty in walking, not elsewhere classified  Other abnormalities of gait and mobility  Rationale for Evaluation and Treatment Rehabilitation  SUBJECTIVE:  SUBJECTIVE STATEMENT: Pt reports she was achy when she woke up this morning but she feels better now that she has moved around, has 1/10 pain in her lower back where pinched nerve is, may be less than a 1. No falls or near falls since last session. Has been doing her HEP from previous round of PT except for one exercise which she is unable to recall which increased her R hip and neck pain. Does tai chi 1x/week, doing her previous HEP, walks 4 blocks most days/week.  Pt accompanied by: self  PERTINENT HISTORY: PMH: Arthritis,  L3-L4 posterior lumbar interbody fusion, hx of anxiety, depression, osteoporosis, spinal stenosis   PAIN:  Are you having pain? Yes: NPRS scale: 1/10 Pain location: Low back Pain description: "Muscle pain"  Aggravating factors: After walking, standing and doing the dishes. Moving the arms and standing Relieving factors: Exercising.   PRECAUTIONS: None  FALLS: Has patient fallen in last 6 months? No  LIVING ENVIRONMENT: Lives with: lives alone Lives in: House/apartment Stairs: Yes: External: 2  steps; has porch/metal post Has following equipment at home: None  PLOF: Independent, used to enjoy hiking (last time was in 2016)  PATIENT GOALS Wants to improve strength of L abductor muscle. Wants L hip to relax.   OBJECTIVE:    TODAY'S TREATMENT:  THER EX: Reviewed previous HEP from previous round of OPPT, added to HEP, see bolded below  THER ACT:  [facility] PT Assessment - 03/28/22 1413       Functional Gait  Assessment   Gait assessed  Yes    Gait Level Surface Walks 20 ft, slow speed, abnormal gait pattern, evidence for imbalance or deviates 10-15 in outside of the 12 in walkway width. Requires more than 7 sec to ambulate 20 ft.    Change in Gait Speed Able to change speed, demonstrates mild gait deviations, deviates 6-10 in outside of the 12 in walkway width, or no gait deviations, unable to achieve a major change in velocity, or uses a change in velocity, or uses an assistive device.    Gait with Horizontal Head Turns Performs head turns with moderate changes in gait velocity, slows down, deviates 10-15 in outside 12 in walkway width but recovers, can continue to walk.    Gait with Vertical Head Turns Performs task with moderate change in gait velocity, slows down, deviates 10-15 in outside 12 in walkway width but recovers, can continue to walk.    Gait and Pivot Turn Pivot turns safely in greater than 3 sec and stops with no loss of balance, or pivot turns safely within 3 sec and stops with mild imbalance, requires small steps to catch balance.    Step Over Obstacle Is able to step over one shoe box (4.5 in total height) but must slow down and adjust steps to clear box safely. May require verbal cueing.    Gait with Narrow Base of Support Ambulates less than 4 steps heel to toe or cannot perform without assistance.    Gait with Eyes Closed Walks 20 ft, slow speed, abnormal gait pattern, evidence for imbalance, deviates 10-15 in outside 12 in walkway width. Requires more than 9 sec to  ambulate 20 ft.    Ambulating Backwards Walks 20 ft, uses assistive device, slower speed, mild gait deviations, deviates 6-10 in outside 12 in walkway width.    Steps Alternating feet, must use rail.    Total Score 12    FGA comment: 12/30, high fall risk  PATIENT EDUCATION: Education details:reviewed former HEP, added to [employer] Person educated: Patient Education method: Theatre stage manager Education comprehension: verbalized understanding   HOME EXERCISE PROGRAM: Access Code: [email or web address] URL: [email or web address] Date: 03/28/2022 Prepared by: Excell Seltzer  Exercises - Supine Bridge  - 1 x daily - 7 x weekly - 2 sets - 10 reps - Supine 90/90 Abdominal Bracing  - 1 x daily - 7 x weekly - 3 reps - 20-30 sec hold - Supine 90/90 Alternating Heel Touches with Posterior Pelvic Tilt  - 1 x daily - 7 x weekly - 2 sets - 10 reps - Seated Hamstring Stretch  - 1 x daily - 7 x weekly - 2-min hold - Standing Hip Abduction with Counter Support  - 1 x daily - 7 x weekly - 3 sets - 10 reps - Standing Hip Extension with Counter Support  - 1 x daily - 7 x weekly - 3 sets - 10 reps - Side Stepping with Counter Support  - 1 x daily - 7 x weekly - 3 sets - 10 reps    GOALS: Goals reviewed with patient? Yes  SHORT TERM GOALS:  ALL STGS = LTGS  LONG TERM GOALS: Target date: 04/22/2022  Pt will be independent with HEP for strength, ROM, balance in order to build upon functional gains made in therapy. Baseline:  Goal status: INITIAL  2.  Pt will improve FGA to 19/30 for decreased fall risk  Baseline: 12/30 (9/18) Goal status: INITIAL  3. Pt will improve gait speed with no AD to at least 2.7 ft/sec in order to demo improved community mobility.  Baseline: 13.06 seconds = 2.51 ft/sec Goal status: INITIAL  4.  Pt will improve 30 second chair stand to at least 10 sit <> stands in order to demo improved functional strength.  Baseline: 8 sit <> stands with no UE  support (<10 below normal for age) Goal status: INITIAL  5.  Pt will improve L hip ABD strength to at least a 4/5 to demo improved strength for balance/gait.  Baseline: 3/5 Goal status: INITIAL  ASSESSMENT:  CLINICAL IMPRESSION: Emphasis of skilled PT session on assessing FGA, reviewing pt's previous HEP, and adding to HEP. Pt scores 12/30 on the FGA, indicating a high fall risk. Pt exhibits most difficulty performing gait with horizontal head turns. Pt able to perform all exercises from her former as HEP as found in pt's chart. Added in sidestepping for additional hip abductor strengthening exercise. Handout provided for HEP. Pt continues to benefit from skilled therapy services to address ongoing gait/balance impairments as well as hip abd weakness. Continue POC.   OBJECTIVE IMPAIRMENTS Abnormal gait, decreased activity tolerance, decreased balance, decreased mobility, difficulty walking, decreased ROM, decreased strength, hypomobility, increased fascial restrictions, impaired flexibility, impaired sensation, postural dysfunction, and pain.   ACTIVITY LIMITATIONS carrying, bending, squatting, stairs, transfers, and locomotion level  PARTICIPATION LIMITATIONS: community activity and hiking  PERSONAL FACTORS Age, Behavior pattern, Past/current experiences, Time since onset of injury/illness/exacerbation, and 3+ comorbidities:  Arthritis,  L3-L4 posterior lumbar interbody fusion, hx of anxiety, depression, osteoporosis, spinal stenosis   are also affecting patient's functional outcome.   REHAB POTENTIAL: Good  CLINICAL DECISION MAKING: Stable/uncomplicated  EVALUATION COMPLEXITY: Low  PLAN: PT FREQUENCY: 2x/week  PT DURATION: 8 weeks  PLANNED INTERVENTIONS: Therapeutic exercises, Therapeutic activity, Neuromuscular re-education, Balance training, Gait training, Patient/Family education, Self Care, Joint mobilization, Stair training, Vestibular training, DME instructions, Aquatic  Therapy, Spinal mobilization, Moist heat, Manual therapy, and Re-evaluation  PLAN  FOR NEXT SESSION: add to HEP for hip ROM, strengthening, balance; tandem gait, backwards gait, static stance with head turns, SKFO vs clams   Excell Seltzer, PT, DPT, CSRS  03/28/2022, 2:46 PM

## 2022-03-30 ENCOUNTER — Ambulatory Visit: Payer: Medicare Other | Admitting: Physical Therapy

## 2022-03-30 ENCOUNTER — Encounter: Payer: Self-pay | Admitting: Physical Therapy

## 2022-03-30 DIAGNOSIS — R2681 Unsteadiness on feet: Secondary | ICD-10-CM

## 2022-03-30 DIAGNOSIS — R262 Difficulty in walking, not elsewhere classified: Secondary | ICD-10-CM | POA: Diagnosis not present

## 2022-03-30 DIAGNOSIS — R2689 Other abnormalities of gait and mobility: Secondary | ICD-10-CM

## 2022-03-30 DIAGNOSIS — M6281 Muscle weakness (generalized): Secondary | ICD-10-CM | POA: Diagnosis not present

## 2022-03-30 DIAGNOSIS — M5459 Other low back pain: Secondary | ICD-10-CM | POA: Diagnosis not present

## 2022-03-30 NOTE — Therapy (Signed)
OUTPATIENT PHYSICAL THERAPY NEURO EVALUATION   Patient Name: Brittany [name] MRN: [id number] DOB:1946-05-25, 76 y.o., female Today's Date: 03/30/2022   PCP: Mayra Neer, MD  REFERRING PROVIDER: Mayra Neer, MD    PT End of Session - 03/30/22 1406     Visit Number 3    Number of Visits 9    Date for PT Re-Evaluation 05/24/22    Authorization Type [employer] Medicare    PT Start Time 1405    PT Stop Time 1445    PT Time Calculation (min) 40 min    Equipment Utilized During Treatment Gait belt    Activity Tolerance Patient tolerated treatment well    Behavior During Therapy WFL for tasks assessed/performed   talkative             Past Medical History:  Diagnosis Date   Anemia    h/o   Anxiety    Arthritis    Asthma    as child  no problem since   Degenerative spondylolisthesis    L4-5, grade 1   Depression    Headache    hx migraines, tension headaches   HLD (hyperlipidemia)    Hypercholesteremia    Left lumbar radiculopathy    Lumbago    Lumbar degenerative disc disease    Lumbar spondylosis    Lumbar stenosis with neurogenic claudication    L4-5   OCD (obsessive compulsive disorder)    Osteoporosis    Post-nasal drip    Slow transit constipation    Spinal stenosis    Vitamin D deficiency    Weakness of both lower extremities    Past Surgical History:  Procedure Laterality Date   adnoids     ANTERIOR CERVICAL DECOMP/DISCECTOMY FUSION N/A 03/22/2018   Procedure: ANTERIOR CERVICAL DECOMPRESSION/DISCECTOMY FUSION CERVICAL FOUR - CERVICAL FIVE, CERVICAL FIVE - CERVICAL SIX, CERVICAL SIX- CERVICAL SEVEN;  Surgeon: Jovita Gamma, MD;  Location: [place];  Service: Neurosurgery;  Laterality: N/A;  ANTERIOR CERVICAL DECOMPRESSION/DISCECTOMY FUSION CERVICAL FOUR - CERVICAL FIVE, CERVICAL FIVE - CERVICAL SIX, CERVICAL SIX- CERVICAL SEVEN   BACK SURGERY  2017   CHOLECYSTECTOMY N/A 08/15/2017   Procedure: LAPAROSCOPIC CHOLECYSTECTOMY;  Surgeon: Coralie Keens,  MD;  Location: [place];  Service: General;  Laterality: N/A;   EYE SURGERY     bil cataract   TONSILLECTOMY     t+a   Patient Active Problem List   Diagnosis Date Noted   Degenerative spondylolisthesis 03/07/2020   S/P lumbar fusion 03/06/2020   HNP (herniated nucleus pulposus), cervical 03/22/2018   S/P laparoscopic cholecystectomy 08/15/2017   Lumbar stenosis with neurogenic claudication 10/05/2015    ONSET DATE: 03/11/2022   REFERRING DIAG: R27.0 (ICD-10-CM) - Ataxia, unspecified   THERAPY DIAG:  Muscle weakness (generalized)  Unsteadiness on feet  Difficulty in walking, not elsewhere classified  Other abnormalities of gait and mobility  Rationale for Evaluation and Treatment Rehabilitation  PERTINENT HISTORY: PMH: Arthritis,  L3-L4 posterior lumbar interbody fusion, hx of anxiety, depression, osteoporosis, spinal stenosis   PRECAUTIONS: None  PLOF: , used to enjoy hiking (last time was in 2016)  PATIENT GOALS Wants to improve strength of L abductor muscle. Wants L hip to relax.  SUBJECTIVE STATEMENT: No new complains. No falls.    Pt accompanied by: self  PAIN:  Are you having pain? Yes: NPRS scale: 1/10 Pain location: Low back Pain description: "Muscle pain"  Aggravating factors: After walking, standing and doing the dishes. Moving the arms and standing Relieving factors: Exercising.      TODAY'S TREATMENT: STRENGTHENING  Reviewed supine ex's due to pt reporting increased lower back pain with resuming ex's. Unable to replicate the pain she reported having, therefore did not change or remove any. Does need cues for abdominal bracing and ex form/technique.    BALANCE/NMR: Added balance ex's to HEP. Refer to [place] for full details. No issues noted or reported with  performance in session. SBA for balance   Gait around track with no device: working on speed changes and scanning environment randomly with CGA assist. Minor veering and decreased gait speed noted with scanning, no significant balance loss.     PATIENT EDUCATION: Education details: balance ex's added to HEP Person educated: Patient Education method: Theatre stage manager Education comprehension: verbalized understanding   HOME EXERCISE PROGRAM: Access Code: [email or web address] URL: [email or web address] Date: 03/30/2022 Prepared by: Willow Ora  Exercises - Supine Bridge  - 1 x daily - 7 x weekly - 2 sets - 10 reps - Supine 90/90 Abdominal Bracing  - 1 x daily - 7 x weekly - 3 reps - 20-30 sec hold - Supine 90/90 Alternating Heel Touches with Posterior Pelvic Tilt  - 1 x daily - 7 x weekly - 2 sets - 10 reps - Seated Hamstring Stretch  - 1 x daily - 7 x weekly - 2-min hold - Standing Hip Abduction with Counter Support  - 1 x daily - 7 x weekly - 3 sets - 10 reps - Standing Hip Extension with Counter Support  - 1 x daily - 7 x weekly - 3 sets - 10 reps - Side Stepping with Counter Support  - 1 x daily - 7 x weekly - 3 sets - 10 reps Added on 03/30/22 - Standing in Corner with Eyes Open with Head Movements  - 1 x daily - 5 x weekly - 1 sets - 3 reps - 30 seconds hold - Standing in corner with eyes open  - 1 x daily - 5 x weekly - 1 sets - 10 reps - Tandem Walking with Counter Support  - 1 x daily - 5 x weekly - 1 sets - 2 reps - walking marching  - 1 x daily - 5 x weekly - 1 sets - 3 reps   GOALS: Goals reviewed with patient? Yes  SHORT TERM GOALS:  ALL STGS = LTGS  LONG TERM GOALS: Target date: 04/22/2022  Pt will be independent with HEP for strength, ROM, balance in order to build upon functional gains made in therapy. Baseline:  Goal status: INITIAL  2.  Pt will improve FGA to 19/30 for decreased fall risk  Baseline: 12/30 (9/18) Goal status: INITIAL  3. Pt will  improve gait speed with no AD to at least 2.7 ft/sec in order to demo improved community mobility.  Baseline: 13.06 seconds = 2.51 ft/sec Goal status: INITIAL  4.  Pt will improve 30 second chair stand to at least 10 sit <> stands in order to demo improved functional strength.  Baseline: 8 sit <> stands with no UE support (<10 below normal for age) Goal status: INITIAL  5.  Pt will improve L hip ABD strength to at least a 4/5 to  demo improved strength for balance/gait.  Baseline: 3/5 Goal status: INITIAL  ASSESSMENT:  CLINICAL IMPRESSION: Today's skilled session focused on addition of balance ex's to HEP with no issues noted or reported in session. Remainder of session continued to address dynamic gait/balance with no significant balance loss noted, however pt did have decreased gait speed/veering with scanning. The pt is making progress and should benefit from continued PT to progress toward unmet goals.    OBJECTIVE IMPAIRMENTS Abnormal gait, decreased activity tolerance, decreased balance, decreased mobility, difficulty walking, decreased ROM, decreased strength, hypomobility, increased fascial restrictions, impaired flexibility, impaired sensation, postural dysfunction, and pain.   ACTIVITY LIMITATIONS carrying, bending, squatting, stairs, transfers, and locomotion level  PARTICIPATION LIMITATIONS: community activity and hiking  PERSONAL FACTORS Age, Behavior pattern, Past/current experiences, Time since onset of injury/illness/exacerbation, and 3+ comorbidities:  Arthritis,  L3-L4 posterior lumbar interbody fusion, hx of anxiety, depression, osteoporosis, spinal stenosis   are also affecting patient's functional outcome.   REHAB POTENTIAL: Good  CLINICAL DECISION MAKING: Stable/uncomplicated  EVALUATION COMPLEXITY: Low  PLAN: PT FREQUENCY: 2x/week  PT DURATION: 8 weeks  PLANNED INTERVENTIONS: Therapeutic exercises, Therapeutic activity, Neuromuscular re-education, Balance  training, Gait training, Patient/Family education, Self Care, Joint mobilization, Stair training, Vestibular training, DME instructions, Aquatic Therapy, Spinal mobilization, Moist heat, Manual therapy, and Re-evaluation  PLAN FOR NEXT SESSION:  Continued to work on LE strengthening, balance on compliant surfaces without/with vision removed and dynamic gait/balance.     Willow Ora, PTA, [place] [address] [place], [place] [id number] [phone] 03/30/22, 4:07 PM

## 2022-04-04 ENCOUNTER — Ambulatory Visit: Payer: Medicare Other | Admitting: Physical Therapy

## 2022-04-04 ENCOUNTER — Encounter: Payer: Self-pay | Admitting: Physical Therapy

## 2022-04-04 DIAGNOSIS — R2689 Other abnormalities of gait and mobility: Secondary | ICD-10-CM | POA: Diagnosis not present

## 2022-04-04 DIAGNOSIS — R2681 Unsteadiness on feet: Secondary | ICD-10-CM

## 2022-04-04 DIAGNOSIS — M6281 Muscle weakness (generalized): Secondary | ICD-10-CM | POA: Diagnosis not present

## 2022-04-04 DIAGNOSIS — R262 Difficulty in walking, not elsewhere classified: Secondary | ICD-10-CM | POA: Diagnosis not present

## 2022-04-04 DIAGNOSIS — M5459 Other low back pain: Secondary | ICD-10-CM | POA: Diagnosis not present

## 2022-04-04 NOTE — Therapy (Signed)
OUTPATIENT PHYSICAL THERAPY NEURO TREATMENT   Patient Name: Brittany [name] MRN: [id number] DOB:12-26-1945, 76 y.o., female Today's Date: 04/04/2022   PCP: Mayra Neer, MD  REFERRING PROVIDER: Mayra Neer, MD    PT End of Session - 04/04/22 1452     Visit Number 4    Number of Visits 9    Date for PT Re-Evaluation 05/24/22    Authorization Type [employer] Medicare    PT Start Time 1450   pt late to session   PT Stop Time 1529    PT Time Calculation (min) 39 min    Equipment Utilized During Treatment Gait belt    Activity Tolerance Patient tolerated treatment well    Behavior During Therapy WFL for tasks assessed/performed   talkative             Past Medical History:  Diagnosis Date   Anemia    h/o   Anxiety    Arthritis    Asthma    as child  no problem since   Degenerative spondylolisthesis    L4-5, grade 1   Depression    Headache    hx migraines, tension headaches   HLD (hyperlipidemia)    Hypercholesteremia    Left lumbar radiculopathy    Lumbago    Lumbar degenerative disc disease    Lumbar spondylosis    Lumbar stenosis with neurogenic claudication    L4-5   OCD (obsessive compulsive disorder)    Osteoporosis    Post-nasal drip    Slow transit constipation    Spinal stenosis    Vitamin D deficiency    Weakness of both lower extremities    Past Surgical History:  Procedure Laterality Date   adnoids     ANTERIOR CERVICAL DECOMP/DISCECTOMY FUSION N/A 03/22/2018   Procedure: ANTERIOR CERVICAL DECOMPRESSION/DISCECTOMY FUSION CERVICAL FOUR - CERVICAL FIVE, CERVICAL FIVE - CERVICAL SIX, CERVICAL SIX- CERVICAL SEVEN;  Surgeon: Jovita Gamma, MD;  Location: [place];  Service: Neurosurgery;  Laterality: N/A;  ANTERIOR CERVICAL DECOMPRESSION/DISCECTOMY FUSION CERVICAL FOUR - CERVICAL FIVE, CERVICAL FIVE - CERVICAL SIX, CERVICAL SIX- CERVICAL SEVEN   BACK SURGERY  2017   CHOLECYSTECTOMY N/A 08/15/2017   Procedure: LAPAROSCOPIC CHOLECYSTECTOMY;  Surgeon:  Coralie Keens, MD;  Location: [place];  Service: General;  Laterality: N/A;   EYE SURGERY     bil cataract   TONSILLECTOMY     t+a   Patient Active Problem List   Diagnosis Date Noted   Degenerative spondylolisthesis 03/07/2020   S/P lumbar fusion 03/06/2020   HNP (herniated nucleus pulposus), cervical 03/22/2018   S/P laparoscopic cholecystectomy 08/15/2017   Lumbar stenosis with neurogenic claudication 10/05/2015    ONSET DATE: 03/11/2022   REFERRING DIAG: R27.0 (ICD-10-CM) - Ataxia, unspecified   THERAPY DIAG:  Unsteadiness on feet  Muscle weakness (generalized)  Difficulty in walking, not elsewhere classified  Other abnormalities of gait and mobility  Rationale for Evaluation and Treatment Rehabilitation  PERTINENT HISTORY: PMH: Arthritis,  L3-L4 posterior lumbar interbody fusion, hx of anxiety, depression, osteoporosis, spinal stenosis   PRECAUTIONS: None  PLOF: , used to enjoy hiking (last time was in 2016)  PATIENT GOALS Wants to improve strength of L abductor muscle. Wants L hip to relax.  SUBJECTIVE STATEMENT: Did not sleep well last night, reports back was more achy this morning. Can do more before it starts hurting. Reports exercises are going well at home, including the new ones for her balance.    Pt accompanied by: self  PAIN:  Are you having pain? Yes: NPRS scale: 1/10 Pain location: Low back Pain description: "Muscle pain"  Aggravating factors: After walking, standing and doing the dishes. Moving the arms and standing Relieving factors: Exercising.      TODAY'S TREATMENT: STRENGTHENING  SciFit with BUE/BLE for strengthening, ROM, activity tolerance at gear 3.0 for 8 minutes.   Sidelying L hip ABD 2 sets of 10 reps, cues for proper technique and alignment.    In quadruped for core/hip strengthening with LLE straight out behind, performed 2 sets of 10 reps hip extension, repeated with RLE extended out straight 2 sets of 10 reps. Pt needing a seated rest break between each.   10 reps sit <> stands on air ex without UE support.    BALANCE/NMR: Single Leg Stance: On air ex; alternating SLS taps to 2 cones x12 reps each side, pt needing intermittent UE support for balance  Rockerboard: In A/P direction, weight shifting x12 reps - cues for esp getting weight onto toes. Attempting to keep board still 2 sets of 10 reps head turns, 2 sets of 10 reps head nods. Pt more challenged with nods.     PATIENT EDUCATION: Education details: Continue with HEP.  Person educated: Patient Education method: Explanation Education comprehension: verbalized understanding   HOME EXERCISE PROGRAM: Access Code: [email or web address] URL: [email or web address] Date: 03/30/2022 Prepared by: Willow Ora  Exercises - Supine Bridge  - 1 x daily - 7 x weekly - 2 sets - 10 reps - Supine 90/90 Abdominal Bracing  - 1 x daily - 7 x weekly - 3 reps - 20-30 sec hold - Supine 90/90 Alternating Heel Touches with Posterior Pelvic Tilt  - 1 x daily - 7 x weekly - 2 sets - 10 reps - Seated Hamstring Stretch  - 1 x daily - 7 x weekly - 2-min hold - Standing Hip Abduction with Counter Support  - 1 x daily - 7 x weekly - 3 sets - 10 reps - Standing Hip Extension with Counter Support  - 1 x daily - 7 x weekly - 3 sets - 10 reps - Side Stepping with Counter Support  - 1 x daily - 7 x weekly - 3 sets - 10 reps Added on 03/30/22 - Standing in Corner with Eyes Open with Head Movements  - 1 x daily - 5 x weekly - 1 sets - 3 reps - 30 seconds hold - Standing in corner with eyes open  - 1 x daily - 5 x weekly - 1 sets - 10 reps - Tandem Walking with Counter Support  - 1 x daily - 5 x weekly - 1 sets - 2 reps - walking marching  - 1 x daily - 5 x weekly - 1 sets - 3 reps   GOALS: Goals  reviewed with patient? Yes  SHORT TERM GOALS:  ALL STGS = LTGS  LONG TERM GOALS: Target date: 04/22/2022  Pt will be independent with HEP for strength, ROM, balance in order to build upon functional gains made in therapy. Baseline:  Goal status: INITIAL  2.  Pt will improve FGA to 19/30 for decreased fall risk  Baseline: 12/30 (9/18) Goal status: INITIAL  3. Pt will improve gait speed with no AD  to at least 2.7 ft/sec in order to demo improved community mobility.  Baseline: 13.06 seconds = 2.51 ft/sec Goal status: INITIAL  4.  Pt will improve 30 second chair stand to at least 10 sit <> stands in order to demo improved functional strength.  Baseline: 8 sit <> stands with no UE support (<10 below normal for age) Goal status: INITIAL  5.  Pt will improve L hip ABD strength to at least a 4/5 to demo improved strength for balance/gait.  Baseline: 3/5 Goal status: INITIAL  ASSESSMENT:  CLINICAL IMPRESSION: Today's skilled session focused on BLE strengthening and standing balance strategies on compliant surfaces. Pt challenged by SLS tasks on air ex. Pt fatigued with more LLE strengthening exercises, needing intermittent rest breaks. Will continue to progress towards LTGs.    OBJECTIVE IMPAIRMENTS Abnormal gait, decreased activity tolerance, decreased balance, decreased mobility, difficulty walking, decreased ROM, decreased strength, hypomobility, increased fascial restrictions, impaired flexibility, impaired sensation, postural dysfunction, and pain.   ACTIVITY LIMITATIONS carrying, bending, squatting, stairs, transfers, and locomotion level  PARTICIPATION LIMITATIONS: community activity and hiking  PERSONAL FACTORS Age, Behavior pattern, Past/current experiences, Time since onset of injury/illness/exacerbation, and 3+ comorbidities:  Arthritis,  L3-L4 posterior lumbar interbody fusion, hx of anxiety, depression, osteoporosis, spinal stenosis   are also affecting patient's functional  outcome.   REHAB POTENTIAL: Good  CLINICAL DECISION MAKING: Stable/uncomplicated  EVALUATION COMPLEXITY: Low  PLAN: PT FREQUENCY: 2x/week  PT DURATION: 8 weeks  PLANNED INTERVENTIONS: Therapeutic exercises, Therapeutic activity, Neuromuscular re-education, Balance training, Gait training, Patient/Family education, Self Care, Joint mobilization, Stair training, Vestibular training, DME instructions, Aquatic Therapy, Spinal mobilization, Moist heat, Manual therapy, and Re-evaluation  PLAN FOR NEXT SESSION:  Continued to work on LE strengthening, balance on compliant surfaces without/with vision removed and dynamic gait/balance.     Janann August, PT, DPT 04/04/22 3:30 PM

## 2022-04-06 ENCOUNTER — Ambulatory Visit: Payer: Medicare Other | Admitting: Physical Therapy

## 2022-04-07 ENCOUNTER — Encounter: Payer: Self-pay | Admitting: Physical Therapy

## 2022-04-07 ENCOUNTER — Ambulatory Visit: Payer: Medicare Other | Admitting: Physical Therapy

## 2022-04-07 DIAGNOSIS — R2681 Unsteadiness on feet: Secondary | ICD-10-CM | POA: Diagnosis not present

## 2022-04-07 DIAGNOSIS — M6281 Muscle weakness (generalized): Secondary | ICD-10-CM

## 2022-04-07 DIAGNOSIS — M5459 Other low back pain: Secondary | ICD-10-CM | POA: Diagnosis not present

## 2022-04-07 DIAGNOSIS — R2689 Other abnormalities of gait and mobility: Secondary | ICD-10-CM | POA: Diagnosis not present

## 2022-04-07 DIAGNOSIS — R262 Difficulty in walking, not elsewhere classified: Secondary | ICD-10-CM

## 2022-04-07 NOTE — Therapy (Signed)
OUTPATIENT PHYSICAL THERAPY NEURO TREATMENT   Patient Name: Brittany [name] MRN: [id number] DOB:16-Dec-1945, 76 y.o., female Today's Date: 04/07/2022   PCP: Mayra Neer, MD  REFERRING PROVIDER: Mayra Neer, MD    PT End of Session - 04/07/22 1458     Visit Number 5    Number of Visits 9    Date for PT Re-Evaluation 05/24/22    Authorization Type [employer] Medicare    PT Start Time 1457   pt late to session   PT Stop Time 1531    PT Time Calculation (min) 34 min    Equipment Utilized During Treatment Gait belt    Activity Tolerance Patient tolerated treatment well    Behavior During Therapy WFL for tasks assessed/performed   talkative             Past Medical History:  Diagnosis Date   Anemia    h/o   Anxiety    Arthritis    Asthma    as child  no problem since   Degenerative spondylolisthesis    L4-5, grade 1   Depression    Headache    hx migraines, tension headaches   HLD (hyperlipidemia)    Hypercholesteremia    Left lumbar radiculopathy    Lumbago    Lumbar degenerative disc disease    Lumbar spondylosis    Lumbar stenosis with neurogenic claudication    L4-5   OCD (obsessive compulsive disorder)    Osteoporosis    Post-nasal drip    Slow transit constipation    Spinal stenosis    Vitamin D deficiency    Weakness of both lower extremities    Past Surgical History:  Procedure Laterality Date   adnoids     ANTERIOR CERVICAL DECOMP/DISCECTOMY FUSION N/A 03/22/2018   Procedure: ANTERIOR CERVICAL DECOMPRESSION/DISCECTOMY FUSION CERVICAL FOUR - CERVICAL FIVE, CERVICAL FIVE - CERVICAL SIX, CERVICAL SIX- CERVICAL SEVEN;  Surgeon: Jovita Gamma, MD;  Location: [place];  Service: Neurosurgery;  Laterality: N/A;  ANTERIOR CERVICAL DECOMPRESSION/DISCECTOMY FUSION CERVICAL FOUR - CERVICAL FIVE, CERVICAL FIVE - CERVICAL SIX, CERVICAL SIX- CERVICAL SEVEN   BACK SURGERY  2017   CHOLECYSTECTOMY N/A 08/15/2017   Procedure: LAPAROSCOPIC CHOLECYSTECTOMY;  Surgeon:  Coralie Keens, MD;  Location: [place];  Service: General;  Laterality: N/A;   EYE SURGERY     bil cataract   TONSILLECTOMY     t+a   Patient Active Problem List   Diagnosis Date Noted   Degenerative spondylolisthesis 03/07/2020   S/P lumbar fusion 03/06/2020   HNP (herniated nucleus pulposus), cervical 03/22/2018   S/P laparoscopic cholecystectomy 08/15/2017   Lumbar stenosis with neurogenic claudication 10/05/2015    ONSET DATE: 03/11/2022   REFERRING DIAG: R27.0 (ICD-10-CM) - Ataxia, unspecified   THERAPY DIAG:  Unsteadiness on feet  Muscle weakness (generalized)  Other abnormalities of gait and mobility  Difficulty in walking, not elsewhere classified  Rationale for Evaluation and Treatment Rehabilitation  PERTINENT HISTORY: PMH: Arthritis,  L3-L4 posterior lumbar interbody fusion, hx of anxiety, depression, osteoporosis, spinal stenosis   PRECAUTIONS: None  PLOF: , used to enjoy hiking (last time was in 2016)  PATIENT GOALS Wants to improve strength of L abductor muscle. Wants L hip to relax.  SUBJECTIVE STATEMENT:  Pt lost track of time and was late to appt today. [place] and did a lot of walking and she was sore afterwards.   Pt accompanied by: self  PAIN:  Are you having pain? Yes: NPRS scale: 1/10 Pain location: Low back Pain description: "Muscle pain"  Aggravating factors: After walking, standing and doing the dishes. Moving the arms and standing Relieving factors: Exercising.   If she thinks about it, pt has a very little bit of pain.    TODAY'S TREATMENT: STRENGTHENING   Sidestepping down and back x4 reps with red tband around thighs, cues for proper technique and mini squat position. Upgraded to use of red tband for  HEP.  Forward/retro monster walks with red tband around thighs, cues for technique and wider BOS, down and back x4 reps.   Discussed gradually incr distance/time with walking at home to build up endurance (pt currently walking ~4 blocks or about 20 minutes)  At staircase with 6" step, alternating step ups with floating contralateral leg x10 reps each side, pt needing intermittent UE support, cued for glute activation. Pt with Trendelenburg with step ups with LLE.   BALANCE/NMR: Side Stepping: Down and back x4 reps on blue balance beam, trying to perform some without UE support, pt losing balance posteriorly into the // bars at times.  Tandem Walking: Down and back x3 reps on blue balance beam, pt needing intermittent UE support.  Standing Balance: Surface: Airex Position: Wide Base of Support Completed with: Eyes Closed; Wide BOS > Hip width 3 x 30 seconds, pt losing balance a couple times posteriorly to wall.  With EO, and slight space between feet, 2 sets of 10 reps head turns, 2 sets of 10 reps head nods. More difficulty with head nods.      PATIENT EDUCATION: Education details: Continue with HEP - red tband addition for resistance.  Person educated: Patient Education method: Explanation Education comprehension: verbalized understanding   HOME EXERCISE PROGRAM: Access Code: [email or web address] URL: [email or web address] Date: 03/30/2022 Prepared by: Willow Ora  Exercises - Supine Bridge  - 1 x daily - 7 x weekly - 2 sets - 10 reps - Supine 90/90 Abdominal Bracing  - 1 x daily - 7 x weekly - 3 reps - 20-30 sec hold - Supine 90/90 Alternating Heel Touches with Posterior Pelvic Tilt  - 1 x daily - 7 x weekly - 2 sets - 10 reps - Seated Hamstring Stretch  - 1 x daily - 7 x weekly - 2-min hold - Standing Hip Abduction with Counter Support  - 1 x daily - 7 x weekly - 3 sets - 10 reps - Standing Hip Extension with Counter Support  - 1 x daily - 7 x weekly - 3 sets - 10 reps -  Side Stepping with Counter Support  - 1 x daily - 7 x weekly - 3 sets - 10 reps Added on 03/30/22 - with red tband resistance.  - Standing in [place] with Eyes Open with Head Movements  - 1 x daily - 5 x weekly - 1 sets - 3 reps - 30 seconds hold - Standing in corner with eyes open  - 1 x daily - 5 x weekly - 1 sets - 10 reps - Tandem Walking with Counter Support  - 1 x daily - 5 x weekly - 1 sets - 2 reps - walking marching  - 1 x daily - 5 x weekly - 1 sets -  3 reps   GOALS: Goals reviewed with patient? Yes  SHORT TERM GOALS:  ALL STGS = LTGS  LONG TERM GOALS: Target date: 04/22/2022  Pt will be independent with HEP for strength, ROM, balance in order to build upon functional gains made in therapy. Baseline:  Goal status: INITIAL  2.  Pt will improve FGA to 19/30 for decreased fall risk  Baseline: 12/30 (9/18) Goal status: INITIAL  3. Pt will improve gait speed with no AD to at least 2.7 ft/sec in order to demo improved community mobility.  Baseline: 13.06 seconds = 2.51 ft/sec Goal status: INITIAL  4.  Pt will improve 30 second chair stand to at least 10 sit <> stands in order to demo improved functional strength.  Baseline: 8 sit <> stands with no UE support (<10 below normal for age) Goal status: INITIAL  5.  Pt will improve L hip ABD strength to at least a 4/5 to demo improved strength for balance/gait.  Baseline: 3/5 Goal status: INITIAL  ASSESSMENT:  CLINICAL IMPRESSION: Session limited today due to pt arriving late. Continued to focus on strengthening and balance tasks. With head motions on compliant surfaces, pt more challenged with head nods. With SLS tasks with LLE, pt demonstrates Trendelenburg position indicating continued decr strength of hip ABD.  Will continue to progress towards LTGs.    OBJECTIVE IMPAIRMENTS Abnormal gait, decreased activity tolerance, decreased balance, decreased mobility, difficulty walking, decreased ROM, decreased strength, hypomobility,  increased fascial restrictions, impaired flexibility, impaired sensation, postural dysfunction, and pain.   ACTIVITY LIMITATIONS carrying, bending, squatting, stairs, transfers, and locomotion level  PARTICIPATION LIMITATIONS: community activity and hiking  PERSONAL FACTORS Age, Behavior pattern, Past/current experiences, Time since onset of injury/illness/exacerbation, and 3+ comorbidities:  Arthritis,  L3-L4 posterior lumbar interbody fusion, hx of anxiety, depression, osteoporosis, spinal stenosis   are also affecting patient's functional outcome.   REHAB POTENTIAL: Good  CLINICAL DECISION MAKING: Stable/uncomplicated  EVALUATION COMPLEXITY: Low  PLAN: PT FREQUENCY: 2x/week  PT DURATION: 8 weeks  PLANNED INTERVENTIONS: Therapeutic exercises, Therapeutic activity, Neuromuscular re-education, Balance training, Gait training, Patient/Family education, Self Care, Joint mobilization, Stair training, Vestibular training, DME instructions, Aquatic Therapy, Spinal mobilization, Moist heat, Manual therapy, and Re-evaluation  PLAN FOR NEXT SESSION:  Continued to work on LE strengthening, balance on compliant surfaces without/with vision removed and dynamic gait/balance.     Janann August, PT, DPT 04/07/22 3:45 PM

## 2022-04-11 IMAGING — CR DG CHEST 2V
2 series · 2 of 2 positions shown · non-contrast
Comparison: Chest CT 01/14/2019.

CLINICAL DATA: 74-year-old female preoperative study for lumbar
surgery.

EXAM:
CHEST - 2 VIEW

[w chest pa]
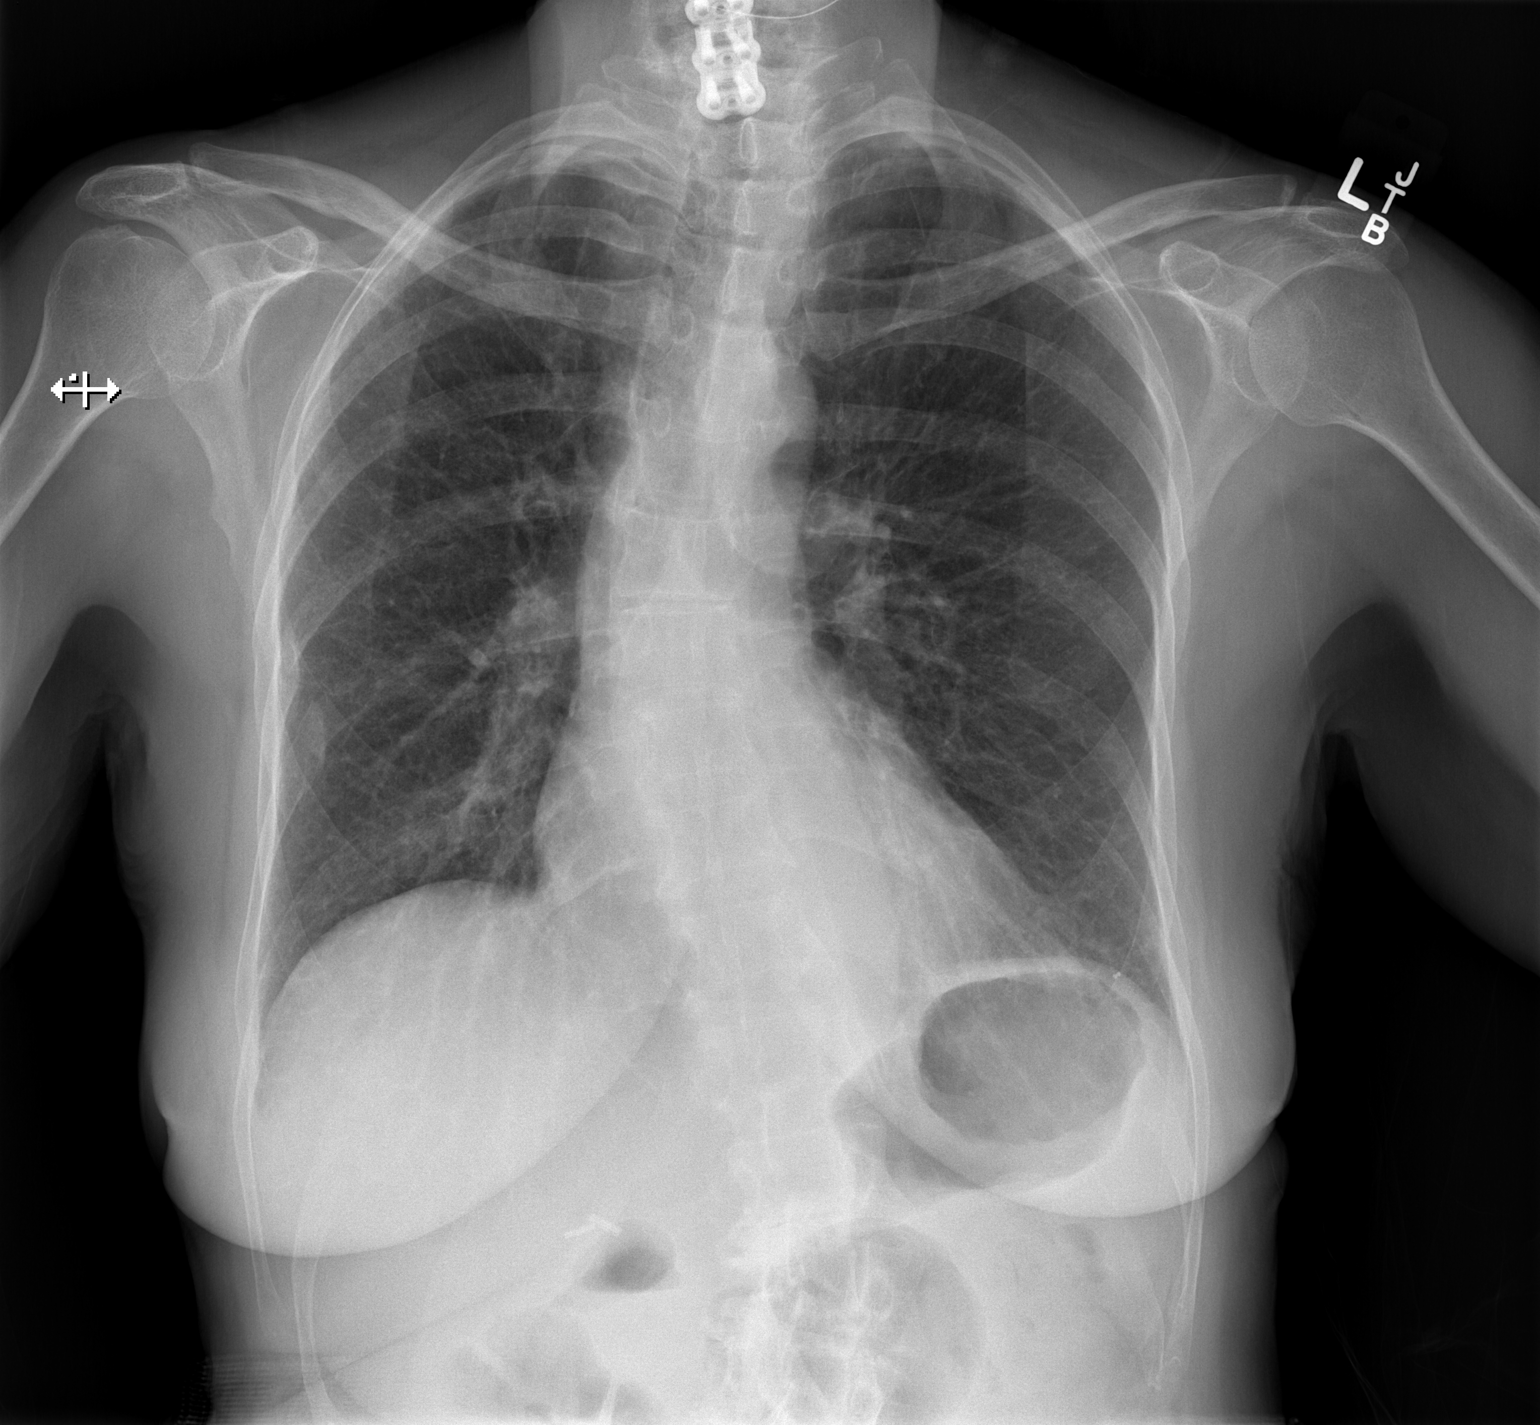

[w chest lat]
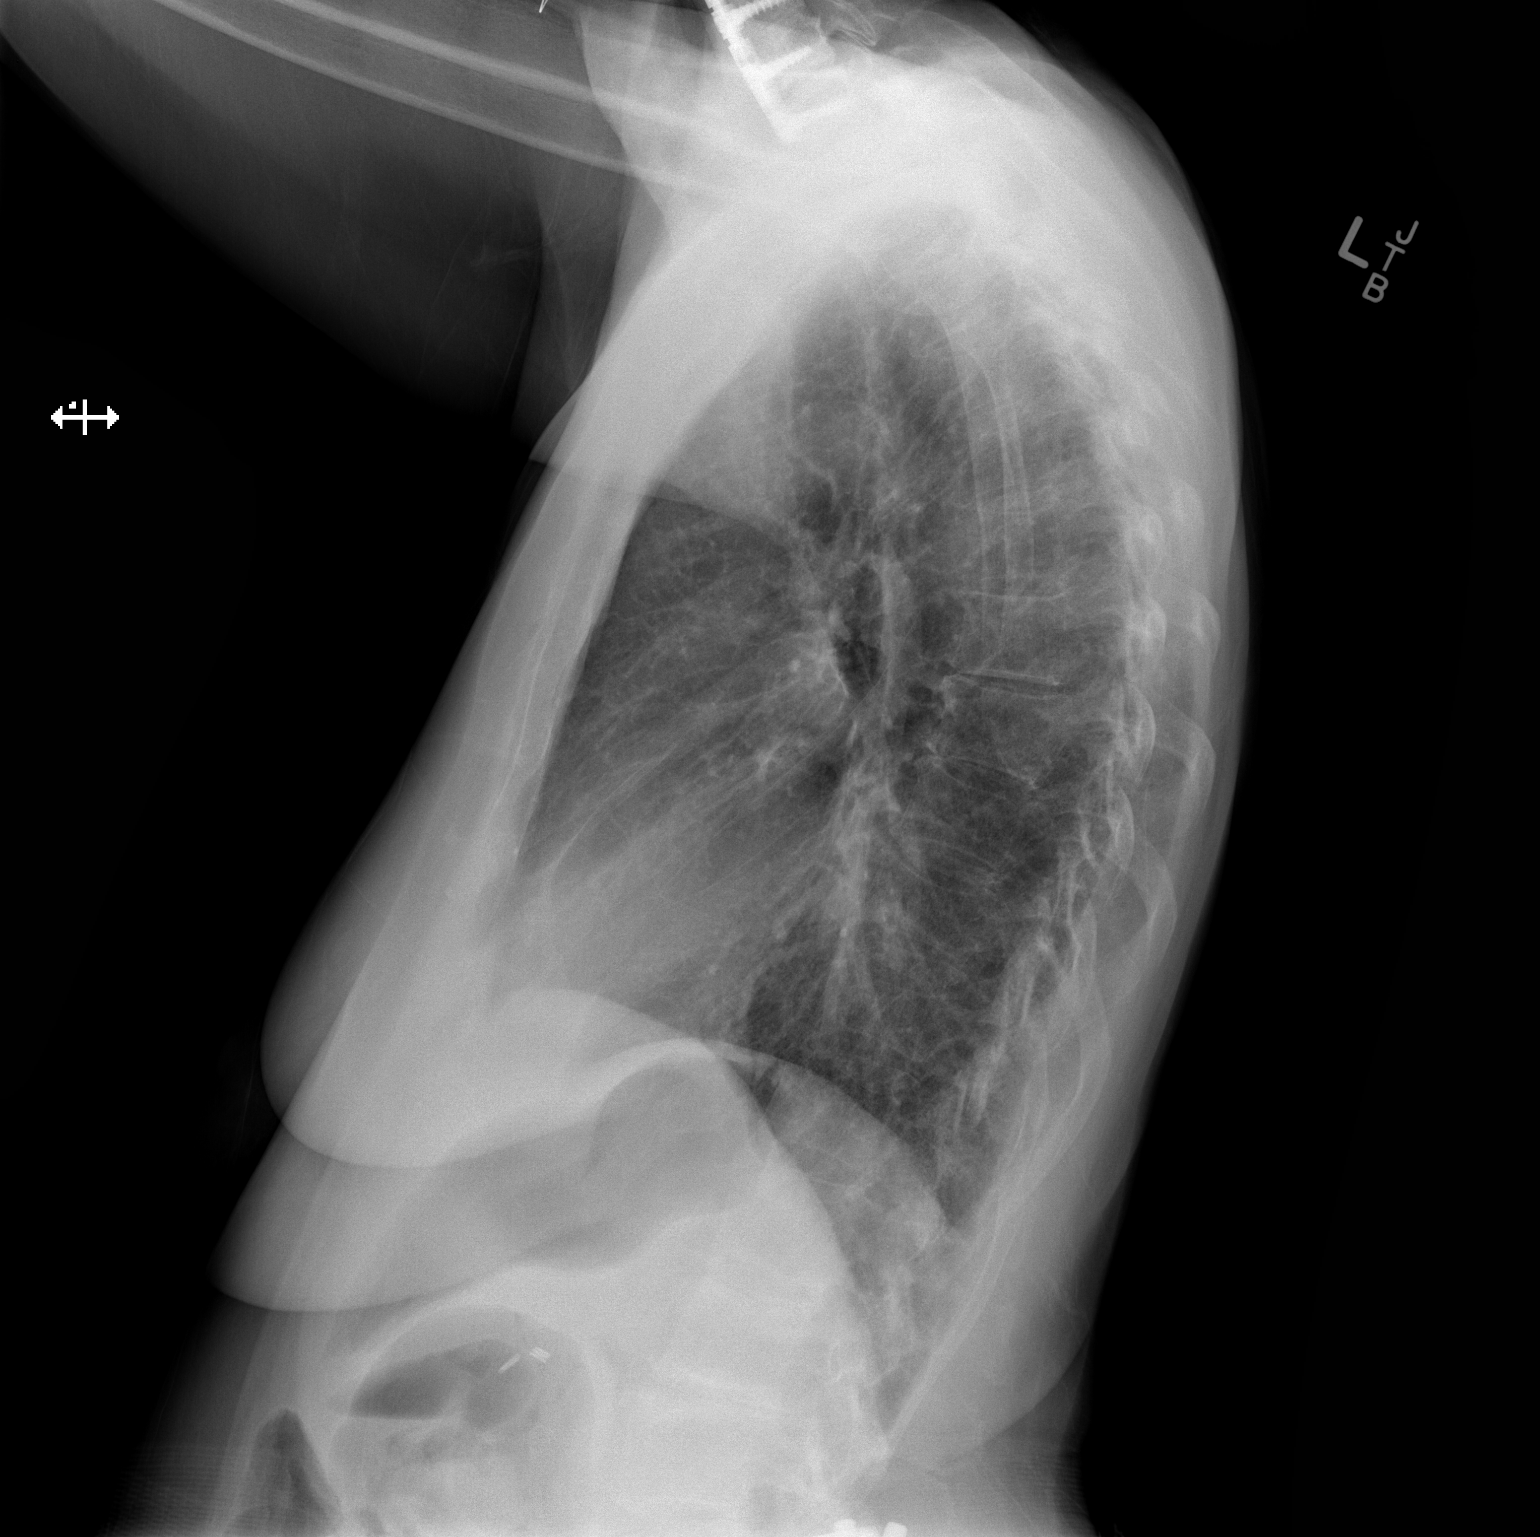

[2 of 2 positions shown; findings below may reference images not displayed]

FINDINGS: Thoracolumbar scoliosis.

Lung volumes and mediastinal contours are within normal limits.
Visualized tracheal air column is within normal limits. No
pneumothorax, pulmonary edema, pleural effusion or confluent
pulmonary opacity.

Chronic right lateral rib fractures. Partially visible previous
cervical ACDF. Stable cholecystectomy clips. Negative visible bowel
gas pattern.
IMPRESSION: No acute cardiopulmonary abnormality.

## 2022-04-13 ENCOUNTER — Ambulatory Visit: Payer: Medicare Other | Admitting: Physical Therapy

## 2022-04-15 ENCOUNTER — Ambulatory Visit: Payer: Medicare Other | Admitting: Physical Therapy

## 2022-04-18 ENCOUNTER — Ambulatory Visit: Payer: Medicare Other | Attending: Family Medicine | Admitting: Physical Therapy

## 2022-04-18 DIAGNOSIS — R2689 Other abnormalities of gait and mobility: Secondary | ICD-10-CM | POA: Diagnosis not present

## 2022-04-18 DIAGNOSIS — M6281 Muscle weakness (generalized): Secondary | ICD-10-CM | POA: Diagnosis not present

## 2022-04-18 DIAGNOSIS — M5459 Other low back pain: Secondary | ICD-10-CM | POA: Diagnosis not present

## 2022-04-18 DIAGNOSIS — G8929 Other chronic pain: Secondary | ICD-10-CM | POA: Insufficient documentation

## 2022-04-18 DIAGNOSIS — M5442 Lumbago with sciatica, left side: Secondary | ICD-10-CM | POA: Insufficient documentation

## 2022-04-18 DIAGNOSIS — R262 Difficulty in walking, not elsewhere classified: Secondary | ICD-10-CM | POA: Insufficient documentation

## 2022-04-18 DIAGNOSIS — R2681 Unsteadiness on feet: Secondary | ICD-10-CM | POA: Diagnosis not present

## 2022-04-18 DIAGNOSIS — R293 Abnormal posture: Secondary | ICD-10-CM | POA: Diagnosis not present

## 2022-04-18 NOTE — Therapy (Signed)
OUTPATIENT PHYSICAL THERAPY NEURO TREATMENT   Patient Name: Brittany [name] MRN: [id number] DOB:1946/06/19, 76 y.o., female Today's Date: 04/18/2022   PCP: Mayra Neer, MD  REFERRING PROVIDER: Mayra Neer, MD    PT End of Session - 04/18/22 1458     Visit Number 6    Number of Visits 9    Date for PT Re-Evaluation 05/24/22    Authorization Type [employer] Medicare    PT Start Time 1450   pt late   PT Stop Time 1530    PT Time Calculation (min) 40 min    Equipment Utilized During Treatment Gait belt    Activity Tolerance Patient tolerated treatment well    Behavior During Therapy WFL for tasks assessed/performed   talkative              Past Medical History:  Diagnosis Date   Anemia    h/o   Anxiety    Arthritis    Asthma    as child  no problem since   Degenerative spondylolisthesis    L4-5, grade 1   Depression    Headache    hx migraines, tension headaches   HLD (hyperlipidemia)    Hypercholesteremia    Left lumbar radiculopathy    Lumbago    Lumbar degenerative disc disease    Lumbar spondylosis    Lumbar stenosis with neurogenic claudication    L4-5   OCD (obsessive compulsive disorder)    Osteoporosis    Post-nasal drip    Slow transit constipation    Spinal stenosis    Vitamin D deficiency    Weakness of both lower extremities    Past Surgical History:  Procedure Laterality Date   adnoids     ANTERIOR CERVICAL DECOMP/DISCECTOMY FUSION N/A 03/22/2018   Procedure: ANTERIOR CERVICAL DECOMPRESSION/DISCECTOMY FUSION CERVICAL FOUR - CERVICAL FIVE, CERVICAL FIVE - CERVICAL SIX, CERVICAL SIX- CERVICAL SEVEN;  Surgeon: Jovita Gamma, MD;  Location: [place];  Service: Neurosurgery;  Laterality: N/A;  ANTERIOR CERVICAL DECOMPRESSION/DISCECTOMY FUSION CERVICAL FOUR - CERVICAL FIVE, CERVICAL FIVE - CERVICAL SIX, CERVICAL SIX- CERVICAL SEVEN   BACK SURGERY  2017   CHOLECYSTECTOMY N/A 08/15/2017   Procedure: LAPAROSCOPIC CHOLECYSTECTOMY;  Surgeon: Coralie Keens, MD;  Location: [place];  Service: General;  Laterality: N/A;   EYE SURGERY     bil cataract   TONSILLECTOMY     t+a   Patient Active Problem List   Diagnosis Date Noted   Degenerative spondylolisthesis 03/07/2020   S/P lumbar fusion 03/06/2020   HNP (herniated nucleus pulposus), cervical 03/22/2018   S/P laparoscopic cholecystectomy 08/15/2017   Lumbar stenosis with neurogenic claudication 10/05/2015    ONSET DATE: 03/11/2022   REFERRING DIAG: R27.0 (ICD-10-CM) - Ataxia, unspecified   THERAPY DIAG:  Unsteadiness on feet  Muscle weakness (generalized)  Other abnormalities of gait and mobility  Difficulty in walking, not elsewhere classified  Other low back pain  Chronic bilateral low back pain with left-sided sciatica  Rationale for Evaluation and Treatment Rehabilitation  PERTINENT HISTORY: PMH: Arthritis,  L3-L4 posterior lumbar interbody fusion, hx of anxiety, depression, osteoporosis, spinal stenosis   PRECAUTIONS: None  PLOF: , used to enjoy hiking (last time was in 2016)  PATIENT GOALS Wants to improve strength of L abductor muscle. Wants L hip to relax.  SUBJECTIVE STATEMENT:  Pt reports 1/10 nerve pain in her low back today. Pt reports she has been doing Tai Chi at the [facility], has been walking daily (up to 20 minutes), and has been doing her HEP as able.   Pt accompanied by: self  PAIN:  Are you having pain? Yes: NPRS scale: 1/10 Pain location: Low back Pain description: "Muscle pain"  Aggravating factors: After walking, standing and doing the dishes. Moving the arms and standing Relieving factors: Exercising.   If she thinks about it, pt has a very little bit of pain.    TODAY'S TREATMENT: THER EX: Added to HEP, see bolded below  THER ACT: Reassessed  measures for assessment of STG:  30 sec sit to stand test: 9 sit to stands with no UE support  L hip abd: 4/5 (IT band pain), R hip abd: 4/5   [facility] PT Assessment - 04/18/22 1502       Ambulation/Gait   Gait velocity 32.8 ft over 13.34 sec = 2.46 ft/sec      Functional Gait  Assessment   Gait Level Surface Walks 20 ft in less than 7 sec but greater than 5.5 sec, uses assistive device, slower speed, mild gait deviations, or deviates 6-10 in outside of the 12 in walkway width.    Change in Gait Speed Able to change speed, demonstrates mild gait deviations, deviates 6-10 in outside of the 12 in walkway width, or no gait deviations, unable to achieve a major change in velocity, or uses a change in velocity, or uses an assistive device.    Gait with Horizontal Head Turns Performs head turns with moderate changes in gait velocity, slows down, deviates 10-15 in outside 12 in walkway width but recovers, can continue to walk.    Gait with Vertical Head Turns Performs task with moderate change in gait velocity, slows down, deviates 10-15 in outside 12 in walkway width but recovers, can continue to walk.    Gait and Pivot Turn Pivot turns safely in greater than 3 sec and stops with no loss of balance, or pivot turns safely within 3 sec and stops with mild imbalance, requires small steps to catch balance.    Step Over Obstacle Is able to step over one shoe box (4.5 in total height) but must slow down and adjust steps to clear box safely. May require verbal cueing.    Gait with Narrow Base of Support Ambulates 7-9 steps.    Gait with Eyes Closed Walks 20 ft, slow speed, abnormal gait pattern, evidence for imbalance, deviates 10-15 in outside 12 in walkway width. Requires more than 9 sec to ambulate 20 ft.    Ambulating Backwards Walks 20 ft, uses assistive device, slower speed, mild gait deviations, deviates 6-10 in outside 12 in walkway width.    Steps Alternating feet, must use rail.    Total Score 16     FGA comment: 16/30, high fall risk            PATIENT EDUCATION: Education details: Continue with HEP - red tband addition for resistance, PT POC, added to HEP Person educated: Patient Education method: Explanation and Handouts Education comprehension: verbalized understanding   HOME EXERCISE PROGRAM: Access Code: [email or web address] URL: [email or web address] Date: 03/30/2022 Prepared by: Willow Ora  Exercises - Supine Bridge  - 1 x daily - 7 x weekly - 2 sets - 10 reps - Supine 90/90 Abdominal Bracing  - 1 x daily - 7 x weekly - 3 reps - 20-30 sec  hold - Supine 90/90 Alternating Heel Touches with Posterior Pelvic Tilt  - 1 x daily - 7 x weekly - 2 sets - 10 reps - Seated Hamstring Stretch  - 1 x daily - 7 x weekly - 2-min hold - Standing Hip Abduction with Counter Support  - 1 x daily - 7 x weekly - 3 sets - 10 reps - Standing Hip Extension with Counter Support  - 1 x daily - 7 x weekly - 3 sets - 10 reps - Side Stepping with Counter Support  - 1 x daily - 7 x weekly - 3 sets - 10 reps Added on 03/30/22 - with red tband resistance.  - Standing in [place] with Eyes Open with Head Movements  - 1 x daily - 5 x weekly - 1 sets - 3 reps - 30 seconds hold - Standing in corner with eyes open  - 1 x daily - 5 x weekly - 1 sets - 10 reps - Tandem Walking with Counter Support  - 1 x daily - 5 x weekly - 1 sets - 2 reps - walking marching  - 1 x daily - 5 x weekly - 1 sets - 3 reps - Supine Lower Trunk Rotation  - 1 x daily - 7 x weekly - 3 sets - 10 reps - Seated Piriformis Stretch with Trunk Bend  - 1 x daily - 7 x weekly - 1 sets - 5 reps - 30-60 sec hold   GOALS: Goals reviewed with patient? Yes  SHORT TERM GOALS:  ALL STGS = LTGS  LONG TERM GOALS: Target date: 04/22/2022  Pt will be independent with HEP for strength, ROM, balance in order to build upon functional gains made in therapy. Baseline:  Goal status: MET  2.  Pt will improve FGA to 19/30 for decreased fall risk   Baseline: 12/30 (9/18), 16/30 (19/9) Goal status: IN PROGRESS  3. Pt will improve gait speed with no AD to at least 2.7 ft/sec in order to demo improved community mobility.  Baseline: 13.06 seconds = 2.51 ft/sec, 2.46 ft/sec (10/9) Goal status: NOT MET  4.  Pt will improve 30 second chair stand to at least 10 sit <> stands in order to demo improved functional strength.  Baseline: 8 sit <> stands with no UE support (<10 below normal for age), 9 sit to/from stands with no UE support (10/9) Goal status: IN PROGRESS  5.  Pt will improve L hip ABD strength to at least a 4/5 to demo improved strength for balance/gait.  Baseline: 3/5, 4/5 (10/9) Goal status: MET  NEW LONG TERM GOALS: Target date 04/29/2022  1.  Pt will improve FGA to 19/30 for decreased fall risk  Baseline: 12/30 (9/18), 16/30 (19/9) Goal status: IN PROGRESS  2. Pt will improve gait speed with no AD to at least 2.7 ft/sec in order to demo improved community mobility.  Baseline: 13.06 seconds = 2.51 ft/sec, 2.46 ft/sec (10/9) Goal status: IN PROGRESS  3.  Pt will improve 30 second chair stand to at least 10 sit <> stands in order to demo improved functional strength.  Baseline: 8 sit <> stands with no UE support (<10 below normal for age), 9 sit to/from stands with no UE support (10/9) Goal status: IN PROGRESS   ASSESSMENT:  CLINICAL IMPRESSION: Emphasis of skilled PT session on reassessing LTG due to date of goals being due, creating new LTG for remaining PT sessions, and adding stretches to HEP for pain management. Pt has  met 2/5 LTG due to being independent with her HEP and improving her hip abd strength to 4/5. Pt has improved her FGA score from 12/30 to 16/30 but still remains a high fall risk and did not meet her goal of 19/30. Pt has improved her 30 sec sit to stands from 8 to 9 but did not quite meet her goal of 10 stands. Pt exhibits decreased gait speed from initial assessment from 2.51 ft/sec to 2.46 ft/sec this  date, however she reports she does purposely decrease her speed because increasing her speed increases her pain in her low back. Pt continues to benefit from skilled therapy services to address ongoing higher level dynamic balance deficits leading to increased fall risk as well as ongoing core and LE weakness leading to increased pain. Continue POC.   OBJECTIVE IMPAIRMENTS Abnormal gait, decreased activity tolerance, decreased balance, decreased mobility, difficulty walking, decreased ROM, decreased strength, hypomobility, increased fascial restrictions, impaired flexibility, impaired sensation, postural dysfunction, and pain.   ACTIVITY LIMITATIONS carrying, bending, squatting, stairs, transfers, and locomotion level  PARTICIPATION LIMITATIONS: community activity and hiking  PERSONAL FACTORS Age, Behavior pattern, Past/current experiences, Time since onset of injury/illness/exacerbation, and 3+ comorbidities:  Arthritis,  L3-L4 posterior lumbar interbody fusion, hx of anxiety, depression, osteoporosis, spinal stenosis   are also affecting patient's functional outcome.   REHAB POTENTIAL: Good  CLINICAL DECISION MAKING: Stable/uncomplicated  EVALUATION COMPLEXITY: Low  PLAN: PT FREQUENCY: 2x/week  PT DURATION: 8 weeks  PLANNED INTERVENTIONS: Therapeutic exercises, Therapeutic activity, Neuromuscular re-education, Balance training, Gait training, Patient/Family education, Self Care, Joint mobilization, Stair training, Vestibular training, DME instructions, Aquatic Therapy, Spinal mobilization, Moist heat, Manual therapy, and Re-evaluation  PLAN FOR NEXT SESSION:  Continued to work on LE strengthening, core strengthening, balance on compliant surfaces without/with vision removed and dynamic gait/balance.     Excell Seltzer, PT, DPT, CSRS 04/18/22 3:31 PM

## 2022-04-20 ENCOUNTER — Ambulatory Visit: Payer: Medicare Other | Admitting: Physical Therapy

## 2022-04-21 ENCOUNTER — Ambulatory Visit: Payer: Medicare Other | Admitting: Physical Therapy

## 2022-04-21 DIAGNOSIS — R2689 Other abnormalities of gait and mobility: Secondary | ICD-10-CM

## 2022-04-21 DIAGNOSIS — R2681 Unsteadiness on feet: Secondary | ICD-10-CM

## 2022-04-21 DIAGNOSIS — M6281 Muscle weakness (generalized): Secondary | ICD-10-CM | POA: Diagnosis not present

## 2022-04-21 DIAGNOSIS — M5459 Other low back pain: Secondary | ICD-10-CM | POA: Diagnosis not present

## 2022-04-21 DIAGNOSIS — M5442 Lumbago with sciatica, left side: Secondary | ICD-10-CM | POA: Diagnosis not present

## 2022-04-21 DIAGNOSIS — G8929 Other chronic pain: Secondary | ICD-10-CM | POA: Diagnosis not present

## 2022-04-21 DIAGNOSIS — R293 Abnormal posture: Secondary | ICD-10-CM | POA: Diagnosis not present

## 2022-04-21 DIAGNOSIS — R262 Difficulty in walking, not elsewhere classified: Secondary | ICD-10-CM | POA: Diagnosis not present

## 2022-04-21 NOTE — Therapy (Signed)
OUTPATIENT PHYSICAL THERAPY NEURO TREATMENT   Patient Name: Brittany [name] MRN: [id number] DOB:[birth year], 76 y.o., female Today's Date: 04/21/2022   PCP: Mayra Neer, MD  REFERRING PROVIDER: Mayra Neer, MD    PT End of Session - 04/21/22 1454     Visit Number 7    Number of Visits 9    Date for PT Re-Evaluation 05/24/22    Authorization Type [employer] Medicare    PT Start Time 1451   Pt arrived late   PT Stop Time 1530    PT Time Calculation (min) 39 min    Equipment Utilized During Treatment --    Activity Tolerance Patient tolerated treatment well    Behavior During Therapy WFL for tasks assessed/performed   talkative               Past Medical History:  Diagnosis Date   Anemia    h/o   Anxiety    Arthritis    Asthma    as child  no problem since   Degenerative spondylolisthesis    L4-5, grade 1   Depression    Headache    hx migraines, tension headaches   HLD (hyperlipidemia)    Hypercholesteremia    Left lumbar radiculopathy    Lumbago    Lumbar degenerative disc disease    Lumbar spondylosis    Lumbar stenosis with neurogenic claudication    L4-5   OCD (obsessive compulsive disorder)    Osteoporosis    Post-nasal drip    Slow transit constipation    Spinal stenosis    Vitamin D deficiency    Weakness of both lower extremities    Past Surgical History:  Procedure Laterality Date   adnoids     ANTERIOR CERVICAL DECOMP/DISCECTOMY FUSION N/A 03/22/2018   Procedure: ANTERIOR CERVICAL DECOMPRESSION/DISCECTOMY FUSION CERVICAL FOUR - CERVICAL FIVE, CERVICAL FIVE - CERVICAL SIX, CERVICAL SIX- CERVICAL SEVEN;  Surgeon: Jovita Gamma, MD;  Location: [place];  Service: Neurosurgery;  Laterality: N/A;  ANTERIOR CERVICAL DECOMPRESSION/DISCECTOMY FUSION CERVICAL FOUR - CERVICAL FIVE, CERVICAL FIVE - CERVICAL SIX, CERVICAL SIX- CERVICAL SEVEN   BACK SURGERY  2017   CHOLECYSTECTOMY N/A 08/15/2017   Procedure: LAPAROSCOPIC CHOLECYSTECTOMY;  Surgeon:  Coralie Keens, MD;  Location: [place];  Service: General;  Laterality: N/A;   EYE SURGERY     bil cataract   TONSILLECTOMY     t+a   Patient Active Problem List   Diagnosis Date Noted   Degenerative spondylolisthesis 03/07/2020   S/P lumbar fusion 03/06/2020   HNP (herniated nucleus pulposus), cervical 03/22/2018   S/P laparoscopic cholecystectomy 08/15/2017   Lumbar stenosis with neurogenic claudication 10/05/2015    ONSET DATE: 03/11/2022   REFERRING DIAG: R27.0 (ICD-10-CM) - Ataxia, unspecified   THERAPY DIAG:  Unsteadiness on feet  Muscle weakness (generalized)  Other abnormalities of gait and mobility  Abnormal posture  Rationale for Evaluation and Treatment Rehabilitation  PERTINENT HISTORY: PMH: Arthritis,  L3-L4 posterior lumbar interbody fusion, hx of anxiety, depression, osteoporosis, spinal stenosis   PRECAUTIONS: None  PLOF: , used to enjoy hiking (last time was in 2016)  PATIENT GOALS Wants to improve strength of L abductor muscle. Wants L hip to relax.  SUBJECTIVE STATEMENT:  Pt reports she really misses the mountains but her "back will no longer do the mountains". Pt states her balance is still off although her legs have gotten stronger. Feels as though her scoliosis is contributing to her instability   Pt accompanied by: self  PAIN:  Are you having pain? Yes: NPRS scale: 1/10 Pain location: Low back Pain description: "Muscle pain"  Aggravating factors: After walking, standing and doing the dishes. Moving the arms and standing Relieving factors: Exercising.   If she thinks about it, pt has a very little bit of pain.    TODAY'S TREATMENT:  NMR  In // bars for improved single leg stability, eccentric quad/hip strength and ankle strategy: Eccentric fwd heel  taps from 6" step w/BUE support, x15 per side. Noted Trendelenburg when tapping w/RLE due to L hip weakness.  Eccentric lateral heel taps from 6" step w/BUE support, x15 per side. Pt had no pain with movement.  Standing on rockerboard in A/P direction, standing without UE support and progressing to alt A/P weight shifts in front of mirror for visual biofeedback. Pt initially very shaky but w/cues for diaphragmatic breathing and to allow ankles to move freely, pt able to smooth A/P sway.  Progressed to horizontal head turns on rockerboard and pt frequently lost balance in posterior direction, requiring intermittent UE support and min A to stabilize  Final progression to standing w/EC on board without UE support and pt stabilized well. Noted increased incidence of LOB in anterior direction > posterior, requiring intermittent UE support and min A to correct.  Alt forward step on airex w/contralateral march, x10 per side. Pt able to perform very few reps without UE support and had increased instability when stepping w/LLE.    PATIENT EDUCATION: Education details: Continue with HEP  Person educated: Patient Education method: Explanation Education comprehension: verbalized understanding   HOME EXERCISE PROGRAM: Access Code: [email or web address] URL: [email or web address] Date: 03/30/2022 Prepared by: Willow Ora  Exercises - Supine Bridge  - 1 x daily - 7 x weekly - 2 sets - 10 reps - Supine 90/90 Abdominal Bracing  - 1 x daily - 7 x weekly - 3 reps - 20-30 sec hold - Supine 90/90 Alternating Heel Touches with Posterior Pelvic Tilt  - 1 x daily - 7 x weekly - 2 sets - 10 reps - Seated Hamstring Stretch  - 1 x daily - 7 x weekly - 2-min hold - Standing Hip Abduction with Counter Support  - 1 x daily - 7 x weekly - 3 sets - 10 reps - Standing Hip Extension with Counter Support  - 1 x daily - 7 x weekly - 3 sets - 10 reps - Side Stepping with Counter Support  - 1 x daily - 7 x weekly - 3 sets -  10 reps Added on 03/30/22 - with red tband resistance.  - Standing in [place] with Eyes Open with Head Movements  - 1 x daily - 5 x weekly - 1 sets - 3 reps - 30 seconds hold - Standing in corner with eyes open  - 1 x daily - 5 x weekly - 1 sets - 10 reps - Tandem Walking with Counter Support  - 1 x daily - 5 x weekly - 1 sets - 2 reps - walking marching  - 1 x daily - 5 x weekly - 1 sets - 3 reps - Supine Lower Trunk Rotation  - 1 x daily - 7 x weekly - 3 sets -  10 reps - Seated Piriformis Stretch with Trunk Bend  - 1 x daily - 7 x weekly - 1 sets - 5 reps - 30-60 sec hold   GOALS: Goals reviewed with patient? Yes  SHORT TERM GOALS:  ALL STGS = LTGS  LONG TERM GOALS: Target date: 04/22/2022  Pt will be independent with HEP for strength, ROM, balance in order to build upon functional gains made in therapy. Baseline:  Goal status: MET  2.  Pt will improve FGA to 19/30 for decreased fall risk  Baseline: 12/30 (9/18), 16/30 (19/9) Goal status: IN PROGRESS  3. Pt will improve gait speed with no AD to at least 2.7 ft/sec in order to demo improved community mobility.  Baseline: 13.06 seconds = 2.51 ft/sec, 2.46 ft/sec (10/9) Goal status: NOT MET  4.  Pt will improve 30 second chair stand to at least 10 sit <> stands in order to demo improved functional strength.  Baseline: 8 sit <> stands with no UE support (<10 below normal for age), 9 sit to/from stands with no UE support (10/9) Goal status: IN PROGRESS  5.  Pt will improve L hip ABD strength to at least a 4/5 to demo improved strength for balance/gait.  Baseline: 3/5, 4/5 (10/9) Goal status: MET  NEW LONG TERM GOALS: Target date 04/29/2022  1.  Pt will improve FGA to 19/30 for decreased fall risk  Baseline: 12/30 (9/18), 16/30 (19/9) Goal status: IN PROGRESS  2. Pt will improve gait speed with no AD to at least 2.7 ft/sec in order to demo improved community mobility.  Baseline: 13.06 seconds = 2.51 ft/sec, 2.46 ft/sec  (10/9) Goal status: IN PROGRESS  3.  Pt will improve 30 second chair stand to at least 10 sit <> stands in order to demo improved functional strength.  Baseline: 8 sit <> stands with no UE support (<10 below normal for age), 9 sit to/from stands with no UE support (10/9) Goal status: IN PROGRESS   ASSESSMENT:  CLINICAL IMPRESSION: Emphasis of skilled PT session on single leg stability, ankle strategy and BLE strength. Pt tolerated session well and required intermittent standing rest break intermittently due to fatigue and low back pain. Pt had most difficulty w/maintaining balance w/horizontal head turns, but demonstrated good righting reactions throughout session. Continue POC.    OBJECTIVE IMPAIRMENTS Abnormal gait, decreased activity tolerance, decreased balance, decreased mobility, difficulty walking, decreased ROM, decreased strength, hypomobility, increased fascial restrictions, impaired flexibility, impaired sensation, postural dysfunction, and pain.   ACTIVITY LIMITATIONS carrying, bending, squatting, stairs, transfers, and locomotion level  PARTICIPATION LIMITATIONS: community activity and hiking  PERSONAL FACTORS Age, Behavior pattern, Past/current experiences, Time since onset of injury/illness/exacerbation, and 3+ comorbidities:  Arthritis,  L3-L4 posterior lumbar interbody fusion, hx of anxiety, depression, osteoporosis, spinal stenosis   are also affecting patient's functional outcome.   REHAB POTENTIAL: Good  CLINICAL DECISION MAKING: Stable/uncomplicated  EVALUATION COMPLEXITY: Low  PLAN: PT FREQUENCY: 2x/week  PT DURATION: 8 weeks  PLANNED INTERVENTIONS: Therapeutic exercises, Therapeutic activity, Neuromuscular re-education, Balance training, Gait training, Patient/Family education, Self Care, Joint mobilization, Stair training, Vestibular training, DME instructions, Aquatic Therapy, Spinal mobilization, Moist heat, Manual therapy, and Re-evaluation  PLAN FOR NEXT  SESSION:  Continued to work on LE strengthening, core strengthening, balance on compliant surfaces without/with vision removed and dynamic gait/balance.     Mickie Bail Gayle Collard, PT, DPT 04/21/22 3:30 PM

## 2022-04-26 ENCOUNTER — Ambulatory Visit: Payer: Medicare Other | Admitting: Physical Therapy

## 2022-04-26 DIAGNOSIS — R293 Abnormal posture: Secondary | ICD-10-CM | POA: Diagnosis not present

## 2022-04-26 DIAGNOSIS — M5442 Lumbago with sciatica, left side: Secondary | ICD-10-CM | POA: Diagnosis not present

## 2022-04-26 DIAGNOSIS — M6281 Muscle weakness (generalized): Secondary | ICD-10-CM | POA: Diagnosis not present

## 2022-04-26 DIAGNOSIS — R2681 Unsteadiness on feet: Secondary | ICD-10-CM | POA: Diagnosis not present

## 2022-04-26 DIAGNOSIS — M5459 Other low back pain: Secondary | ICD-10-CM

## 2022-04-26 DIAGNOSIS — R2689 Other abnormalities of gait and mobility: Secondary | ICD-10-CM

## 2022-04-26 DIAGNOSIS — R262 Difficulty in walking, not elsewhere classified: Secondary | ICD-10-CM | POA: Diagnosis not present

## 2022-04-26 DIAGNOSIS — G8929 Other chronic pain: Secondary | ICD-10-CM | POA: Diagnosis not present

## 2022-04-26 NOTE — Therapy (Signed)
OUTPATIENT PHYSICAL THERAPY NEURO TREATMENT   Patient Name: Brittany [name] MRN: [id number] DOB:[birth year], 76 y.o., female Today's Date: 04/26/2022   PCP: Mayra Neer, MD  REFERRING PROVIDER: Mayra Neer, MD    PT End of Session - 04/26/22 1621     Visit Number 8    Number of Visits 9    Date for PT Re-Evaluation 05/24/22    Authorization Type [employer] Medicare    PT Start Time 1620   pt late   PT Stop Time 1700    PT Time Calculation (min) 40 min    Equipment Utilized During Treatment Gait belt    Activity Tolerance Patient tolerated treatment well    Behavior During Therapy WFL for tasks assessed/performed   talkative                Past Medical History:  Diagnosis Date   Anemia    h/o   Anxiety    Arthritis    Asthma    as child  no problem since   Degenerative spondylolisthesis    L4-5, grade 1   Depression    Headache    hx migraines, tension headaches   HLD (hyperlipidemia)    Hypercholesteremia    Left lumbar radiculopathy    Lumbago    Lumbar degenerative disc disease    Lumbar spondylosis    Lumbar stenosis with neurogenic claudication    L4-5   OCD (obsessive compulsive disorder)    Osteoporosis    Post-nasal drip    Slow transit constipation    Spinal stenosis    Vitamin D deficiency    Weakness of both lower extremities    Past Surgical History:  Procedure Laterality Date   adnoids     ANTERIOR CERVICAL DECOMP/DISCECTOMY FUSION N/A 03/22/2018   Procedure: ANTERIOR CERVICAL DECOMPRESSION/DISCECTOMY FUSION CERVICAL FOUR - CERVICAL FIVE, CERVICAL FIVE - CERVICAL SIX, CERVICAL SIX- CERVICAL SEVEN;  Surgeon: Jovita Gamma, MD;  Location: [place];  Service: Neurosurgery;  Laterality: N/A;  ANTERIOR CERVICAL DECOMPRESSION/DISCECTOMY FUSION CERVICAL FOUR - CERVICAL FIVE, CERVICAL FIVE - CERVICAL SIX, CERVICAL SIX- CERVICAL SEVEN   BACK SURGERY  2017   CHOLECYSTECTOMY N/A 08/15/2017   Procedure: LAPAROSCOPIC CHOLECYSTECTOMY;  Surgeon:  Coralie Keens, MD;  Location: [place];  Service: General;  Laterality: N/A;   EYE SURGERY     bil cataract   TONSILLECTOMY     t+a   Patient Active Problem List   Diagnosis Date Noted   Degenerative spondylolisthesis 03/07/2020   S/P lumbar fusion 03/06/2020   HNP (herniated nucleus pulposus), cervical 03/22/2018   S/P laparoscopic cholecystectomy 08/15/2017   Lumbar stenosis with neurogenic claudication 10/05/2015    ONSET DATE: 03/11/2022   REFERRING DIAG: R27.0 (ICD-10-CM) - Ataxia, unspecified   THERAPY DIAG:  Unsteadiness on feet  Muscle weakness (generalized)  Other abnormalities of gait and mobility  Abnormal posture  Difficulty in walking, not elsewhere classified  Other low back pain  Chronic bilateral low back pain with left-sided sciatica  Rationale for Evaluation and Treatment Rehabilitation  PERTINENT HISTORY: PMH: Arthritis,  L3-L4 posterior lumbar interbody fusion, hx of anxiety, depression, osteoporosis, spinal stenosis   PRECAUTIONS: None  PLOF: , used to enjoy hiking (last time was in 2016)  PATIENT GOALS Wants to improve strength of L abductor muscle. Wants L hip to relax.  SUBJECTIVE STATEMENT: Pt reports she feels her balance has been worse the past couple of days. Pt also has new pain in her L buttocks that radiates up her back that may have been caused by her struggling to get her cat into its carrier to take it to the vet. Pt reports increase in back pain with housework (vacuuming, washing dishes) or standing still for an extended period of time.  Pt accompanied by: self  PAIN:  Are you having pain? Yes: NPRS scale: 1/10 Pain location: Low back Pain description: "Muscle pain"  Aggravating factors: After walking, standing and doing the dishes. Moving  the arms and standing Relieving factors: Exercising.    TODAY'S TREATMENT:  THER EX: Supine LTR x 10 reps each direction Child's pose 3 x 30 sec each (forwards, L, R)-pt has increase in L shoulder pain Supine thoracic roll stretch x 10 reps  THER ACT: Standing on rocker board in // bars: -A/P with horizontal and vertical head turns with no UE support 3 x 30 sec each -L/R with no UE support 3 x 60 sec, initially with increased difficulty maintaining balance with this configuration vs A/P   PATIENT EDUCATION: Education details: Continue with HEP  Person educated: Patient Education method: Explanation Education comprehension: verbalized understanding   HOME EXERCISE PROGRAM: Access Code: [email or web address] URL: [email or web address] Date: 03/30/2022 Prepared by: Willow Ora  Exercises - Supine Bridge  - 1 x daily - 7 x weekly - 2 sets - 10 reps - Supine 90/90 Abdominal Bracing  - 1 x daily - 7 x weekly - 3 reps - 20-30 sec hold - Supine 90/90 Alternating Heel Touches with Posterior Pelvic Tilt  - 1 x daily - 7 x weekly - 2 sets - 10 reps - Seated Hamstring Stretch  - 1 x daily - 7 x weekly - 2-min hold - Standing Hip Abduction with Counter Support  - 1 x daily - 7 x weekly - 3 sets - 10 reps - Standing Hip Extension with Counter Support  - 1 x daily - 7 x weekly - 3 sets - 10 reps - Side Stepping with Counter Support  - 1 x daily - 7 x weekly - 3 sets - 10 reps Added on 03/30/22 - with red tband resistance.  - Standing in [place] with Eyes Open with Head Movements  - 1 x daily - 5 x weekly - 1 sets - 3 reps - 30 seconds hold - Standing in corner with eyes open  - 1 x daily - 5 x weekly - 1 sets - 10 reps - Tandem Walking with Counter Support  - 1 x daily - 5 x weekly - 1 sets - 2 reps - walking marching  - 1 x daily - 5 x weekly - 1 sets - 3 reps - Supine Lower Trunk Rotation  - 1 x daily - 7 x weekly - 3 sets - 10 reps - Seated Piriformis Stretch with Trunk Bend  - 1 x daily  - 7 x weekly - 1 sets - 5 reps - 30-60 sec hold   GOALS: Goals reviewed with patient? Yes  SHORT TERM GOALS:  ALL STGS = LTGS  LONG TERM GOALS: Target date: 04/22/2022  Pt will be independent with HEP for strength, ROM, balance in order to build upon functional gains made in therapy. Baseline:  Goal status: MET  2.  Pt will improve FGA to 19/30 for decreased fall risk  Baseline: 12/30 (9/18), 16/30 (19/9) Goal status: IN PROGRESS  3. Pt will improve gait speed with no AD to at least 2.7 ft/sec in order to demo improved community mobility.  Baseline: 13.06 seconds = 2.51 ft/sec, 2.46 ft/sec (10/9) Goal status: NOT MET  4.  Pt will improve 30 second chair stand to at least 10 sit <> stands in order to demo improved functional strength.  Baseline: 8 sit <> stands with no UE support (<10 below normal for age), 9 sit to/from stands with no UE support (10/9) Goal status: IN PROGRESS  5.  Pt will improve L hip ABD strength to at least a 4/5 to demo improved strength for balance/gait.  Baseline: 3/5, 4/5 (10/9) Goal status: MET  NEW LONG TERM GOALS: Target date 04/29/2022  1.  Pt will improve FGA to 19/30 for decreased fall risk  Baseline: 12/30 (9/18), 16/30 (19/9) Goal status: IN PROGRESS  2. Pt will improve gait speed with no AD to at least 2.7 ft/sec in order to demo improved community mobility.  Baseline: 13.06 seconds = 2.51 ft/sec, 2.46 ft/sec (10/9) Goal status: IN PROGRESS  3.  Pt will improve 30 second chair stand to at least 10 sit <> stands in order to demo improved functional strength.  Baseline: 8 sit <> stands with no UE support (<10 below normal for age), 9 sit to/from stands with no UE support (10/9) Goal status: IN PROGRESS   ASSESSMENT:  CLINICAL IMPRESSION: Emphasis of skilled PT session on performing back stretches to address ongoing pain and performing static standing balance on rocker board. Pt has some improvement in her pain symptoms following  stretching. Pt exhibits increased difficulty maintaining standing balance on rocker board with L/R lateral configuration vs A/P configuration this date. Discussed pt's POC and pt comfortable with plan to d/c from PT next session.  Continue POC.    OBJECTIVE IMPAIRMENTS Abnormal gait, decreased activity tolerance, decreased balance, decreased mobility, difficulty walking, decreased ROM, decreased strength, hypomobility, increased fascial restrictions, impaired flexibility, impaired sensation, postural dysfunction, and pain.   ACTIVITY LIMITATIONS carrying, bending, squatting, stairs, transfers, and locomotion level  PARTICIPATION LIMITATIONS: community activity and hiking  PERSONAL FACTORS Age, Behavior pattern, Past/current experiences, Time since onset of injury/illness/exacerbation, and 3+ comorbidities:  Arthritis,  L3-L4 posterior lumbar interbody fusion, hx of anxiety, depression, osteoporosis, spinal stenosis   are also affecting patient's functional outcome.   REHAB POTENTIAL: Good  CLINICAL DECISION MAKING: Stable/uncomplicated  EVALUATION COMPLEXITY: Low  PLAN: PT FREQUENCY: 2x/week  PT DURATION: 8 weeks  PLANNED INTERVENTIONS: Therapeutic exercises, Therapeutic activity, Neuromuscular re-education, Balance training, Gait training, Patient/Family education, Self Care, Joint mobilization, Stair training, Vestibular training, DME instructions, Aquatic Therapy, Spinal mobilization, Moist heat, Manual therapy, and Re-evaluation  PLAN FOR NEXT SESSION:  Goal assess and d/c from PT   Excell Seltzer, PT, DPT, CSRS  04/26/22 5:01 PM

## 2022-04-28 ENCOUNTER — Ambulatory Visit: Payer: Medicare Other | Admitting: Physical Therapy

## 2022-04-28 DIAGNOSIS — M6281 Muscle weakness (generalized): Secondary | ICD-10-CM | POA: Diagnosis not present

## 2022-04-28 DIAGNOSIS — R262 Difficulty in walking, not elsewhere classified: Secondary | ICD-10-CM | POA: Diagnosis not present

## 2022-04-28 DIAGNOSIS — R293 Abnormal posture: Secondary | ICD-10-CM

## 2022-04-28 DIAGNOSIS — R2681 Unsteadiness on feet: Secondary | ICD-10-CM | POA: Diagnosis not present

## 2022-04-28 DIAGNOSIS — G8929 Other chronic pain: Secondary | ICD-10-CM | POA: Diagnosis not present

## 2022-04-28 DIAGNOSIS — M5459 Other low back pain: Secondary | ICD-10-CM | POA: Diagnosis not present

## 2022-04-28 DIAGNOSIS — R2689 Other abnormalities of gait and mobility: Secondary | ICD-10-CM | POA: Diagnosis not present

## 2022-04-28 DIAGNOSIS — M5442 Lumbago with sciatica, left side: Secondary | ICD-10-CM | POA: Diagnosis not present

## 2022-04-28 NOTE — Therapy (Addendum)
OUTPATIENT PHYSICAL THERAPY NEURO TREATMENT- DISCHARGE SUMMARY   Patient Name: Brittany [name] MRN: [id number] DOB:1945/10/08, 76 y.o., female 27 Date: 04/28/2022   PCP: Mayra Neer, MD  REFERRING PROVIDER: Mayra Neer, MD   PHYSICAL THERAPY DISCHARGE SUMMARY  Visits from Start of Care: 9  Current functional level related to goals / functional outcomes: Mod I with all mobility without AD   Remaining deficits: Decreased activity tolerance, abnormal posture 2/2 scoliosis, decreased balance w/narrow BOS and single leg stability    Education / Equipment: HEP   Patient agrees to discharge. Patient goals were partially met. Patient is being discharged due to being pleased with the current functional level.    PT End of Session - 04/28/22 1453     Visit Number 9    Number of Visits 9    Date for PT Re-Evaluation 05/24/22    Authorization Type [employer] Medicare    PT Start Time 1453   Pt arrived late   PT Stop Time 1520   DC   PT Time Calculation (min) 27 min    Equipment Utilized During Treatment Gait belt    Activity Tolerance Patient tolerated treatment well    Behavior During Therapy WFL for tasks assessed/performed   talkative                Past Medical History:  Diagnosis Date   Anemia    h/o   Anxiety    Arthritis    Asthma    as child  no problem since   Degenerative spondylolisthesis    L4-5, grade 1   Depression    Headache    hx migraines, tension headaches   HLD (hyperlipidemia)    Hypercholesteremia    Left lumbar radiculopathy    Lumbago    Lumbar degenerative disc disease    Lumbar spondylosis    Lumbar stenosis with neurogenic claudication    L4-5   OCD (obsessive compulsive disorder)    Osteoporosis    Post-nasal drip    Slow transit constipation    Spinal stenosis    Vitamin D deficiency    Weakness of both lower extremities    Past Surgical History:  Procedure Laterality Date   adnoids     ANTERIOR CERVICAL  DECOMP/DISCECTOMY FUSION N/A 03/22/2018   Procedure: ANTERIOR CERVICAL DECOMPRESSION/DISCECTOMY FUSION CERVICAL FOUR - CERVICAL FIVE, CERVICAL FIVE - CERVICAL SIX, CERVICAL SIX- CERVICAL SEVEN;  Surgeon: Jovita Gamma, MD;  Location: [place];  Service: Neurosurgery;  Laterality: N/A;  ANTERIOR CERVICAL DECOMPRESSION/DISCECTOMY FUSION CERVICAL FOUR - CERVICAL FIVE, CERVICAL FIVE - CERVICAL SIX, CERVICAL SIX- CERVICAL SEVEN   BACK SURGERY  2017   CHOLECYSTECTOMY N/A 08/15/2017   Procedure: LAPAROSCOPIC CHOLECYSTECTOMY;  Surgeon: Coralie Keens, MD;  Location: [place];  Service: General;  Laterality: N/A;   EYE SURGERY     bil cataract   TONSILLECTOMY     t+a   Patient Active Problem List   Diagnosis Date Noted   Degenerative spondylolisthesis 03/07/2020   S/P lumbar fusion 03/06/2020   HNP (herniated nucleus pulposus), cervical 03/22/2018   S/P laparoscopic cholecystectomy 08/15/2017   Lumbar stenosis with neurogenic claudication 10/05/2015    ONSET DATE: 03/11/2022   REFERRING DIAG: R27.0 (ICD-10-CM) - Ataxia, unspecified   THERAPY DIAG:  Unsteadiness on feet  Muscle weakness (generalized)  Abnormal posture  Rationale for Evaluation and Treatment Rehabilitation  PERTINENT HISTORY: PMH: Arthritis,  L3-L4 posterior lumbar interbody fusion, hx of anxiety, depression, osteoporosis, spinal stenosis   PRECAUTIONS: None  PLOF: , used to enjoy hiking (last time was in 2016)  PATIENT GOALS Wants to improve strength of L abductor muscle. Wants L hip to relax.                                                                                                                                                                                               SUBJECTIVE STATEMENT: Pt states she is more sore today, thinks it is because of the weather. Reports she is having difficulty managing her housework and her HEP, but her housework is the most painful thing she does. No falls   Pt accompanied  by: self  PAIN:  Are you having pain? Yes: NPRS scale: 1/10 Pain location: Low back Pain description: "Muscle pain"  Aggravating factors: After walking, standing and doing the dishes. Moving the arms and standing Relieving factors: Exercising.    TODAY'S TREATMENT: Ther Act LTG Assessment      [facility] PT Assessment - 04/28/22 1458       Ambulation/Gait   Gait velocity 32.8' over 12.31s = 2.45f/s      Functional Gait  Assessment   Gait Level Surface Walks 20 ft, slow speed, abnormal gait pattern, evidence for imbalance or deviates 10-15 in outside of the 12 in walkway width. Requires more than 7 sec to ambulate 20 ft.   7.13s   Change in Gait Speed Able to smoothly change walking speed without loss of balance or gait deviation. Deviate no more than 6 in outside of the 12 in walkway width.    Gait with Horizontal Head Turns Performs head turns smoothly with slight change in gait velocity (eg, minor disruption to smooth gait path), deviates 6-10 in outside 12 in walkway width, or uses an assistive device.    Gait with Vertical Head Turns Performs task with slight change in gait velocity (eg, minor disruption to smooth gait path), deviates 6 - 10 in outside 12 in walkway width or uses assistive device    Gait and Pivot Turn Pivot turns safely within 3 sec and stops quickly with no loss of balance.    Step Over Obstacle Is able to step over 2 stacked shoe boxes taped together (9 in total height) without changing gait speed. No evidence of imbalance.    Gait with Narrow Base of Support Is able to ambulate for 10 steps heel to toe with no staggering.    Gait with Eyes Closed Walks 20 ft, slow speed, abnormal gait pattern, evidence for imbalance, deviates 10-15 in outside 12 in walkway width. Requires more than 9 sec to ambulate 20 ft.  significant deviation to L side   Ambulating Backwards Walks 20 ft, uses assistive device, slower speed, mild gait deviations, deviates 6-10 in outside 12 in  walkway width.   17.56s   Steps Alternating feet, must use rail.    Total Score 22    FGA comment: Medium fall risk             30s STS: 9 reps without UE support   PATIENT EDUCATION: Education details: Goal outcomes, how to obtain new referral for PT if mobility needs change  Person educated: Patient Education method: Explanation Education comprehension: verbalized understanding   HOME EXERCISE PROGRAM: Access Code: [email or web address] URL: [email or web address] Date: 03/30/2022 Prepared by: Willow Ora  Exercises - Supine Bridge  - 1 x daily - 7 x weekly - 2 sets - 10 reps - Supine 90/90 Abdominal Bracing  - 1 x daily - 7 x weekly - 3 reps - 20-30 sec hold - Supine 90/90 Alternating Heel Touches with Posterior Pelvic Tilt  - 1 x daily - 7 x weekly - 2 sets - 10 reps - Seated Hamstring Stretch  - 1 x daily - 7 x weekly - 2-min hold - Standing Hip Abduction with Counter Support  - 1 x daily - 7 x weekly - 3 sets - 10 reps - Standing Hip Extension with Counter Support  - 1 x daily - 7 x weekly - 3 sets - 10 reps - Side Stepping with Counter Support  - 1 x daily - 7 x weekly - 3 sets - 10 reps Added on 03/30/22 - with red tband resistance.  - Standing in [place] with Eyes Open with Head Movements  - 1 x daily - 5 x weekly - 1 sets - 3 reps - 30 seconds hold - Standing in corner with eyes open  - 1 x daily - 5 x weekly - 1 sets - 10 reps - Tandem Walking with Counter Support  - 1 x daily - 5 x weekly - 1 sets - 2 reps - walking marching  - 1 x daily - 5 x weekly - 1 sets - 3 reps - Supine Lower Trunk Rotation  - 1 x daily - 7 x weekly - 3 sets - 10 reps - Seated Piriformis Stretch with Trunk Bend  - 1 x daily - 7 x weekly - 1 sets - 5 reps - 30-60 sec hold   GOALS: Goals reviewed with patient? Yes  SHORT TERM GOALS:  ALL STGS = LTGS  LONG TERM GOALS: Target date: 04/22/2022  Pt will be independent with HEP for strength, ROM, balance in order to build upon functional  gains made in therapy. Baseline:  Goal status: MET  2.  Pt will improve FGA to 19/30 for decreased fall risk  Baseline: 12/30 (9/18), 16/30 (19/9) Goal status: IN PROGRESS  3. Pt will improve gait speed with no AD to at least 2.7 ft/sec in order to demo improved community mobility.  Baseline: 13.06 seconds = 2.51 ft/sec, 2.46 ft/sec (10/9) Goal status: NOT MET  4.  Pt will improve 30 second chair stand to at least 10 sit <> stands in order to demo improved functional strength.  Baseline: 8 sit <> stands with no UE support (<10 below normal for age), 9 sit to/from stands with no UE support (10/9) Goal status: IN PROGRESS  5.  Pt will improve L hip ABD strength to at least a 4/5 to demo improved strength for balance/gait.  Baseline: 3/5, 4/5 (10/9) Goal  status: MET  NEW LONG TERM GOALS: Target date 04/29/2022  1.  Pt will improve FGA to 19/30 for decreased fall risk  Baseline: 12/30 (9/18), 16/30 (19/9); 22/30 on 10/19 Goal status: MET  2. Pt will improve gait speed with no AD to at least 2.7 ft/sec in order to demo improved community mobility.  Baseline: 13.06 seconds = 2.51 ft/sec, 2.46 ft/sec (10/9); 2.66 ft/s  Goal status: MET  3.  Pt will improve 30 second chair stand to at least 10 sit <> stands in order to demo improved functional strength.  Baseline: 8 sit <> stands with no UE support (<10 below normal for age), 9 sit to/from stands with no UE support (10/9); 9 on 10/19 Goal status: NOT MET    ASSESSMENT:  CLINICAL IMPRESSION: Emphasis of skilled PT session on LTG assessment and DC from PT. Pt has met 2/3 LTGs, improving her gait speed to goal level without AD and her FGA to 22/30, indicative of medium fall risk. Pt narrowly missed her 30s STS goal, performing 9 reps without UE support instead of 10. Pt verbalized agreement to DC from therapy today due to improvement in balance and stated she feels as though the therapy really helped.    OBJECTIVE IMPAIRMENTS Abnormal  gait, decreased activity tolerance, decreased balance, decreased mobility, difficulty walking, decreased ROM, decreased strength, hypomobility, increased fascial restrictions, impaired flexibility, impaired sensation, postural dysfunction, and pain.   ACTIVITY LIMITATIONS carrying, bending, squatting, stairs, transfers, and locomotion level  PARTICIPATION LIMITATIONS: community activity and hiking  PERSONAL FACTORS Age, Behavior pattern, Past/current experiences, Time since onset of injury/illness/exacerbation, and 3+ comorbidities:  Arthritis,  L3-L4 posterior lumbar interbody fusion, hx of anxiety, depression, osteoporosis, spinal stenosis   are also affecting patient's functional outcome.   REHAB POTENTIAL: Good  CLINICAL DECISION MAKING: Stable/uncomplicated  EVALUATION COMPLEXITY: Low  PLAN: PT FREQUENCY: 2x/week  PT DURATION: 8 weeks  PLANNED INTERVENTIONS: Therapeutic exercises, Therapeutic activity, Neuromuscular re-education, Balance training, Gait training, Patient/Family education, Self Care, Joint mobilization, Stair training, Vestibular training, DME instructions, Aquatic Therapy, Spinal mobilization, Moist heat, Manual therapy, and Re-evaluation   Derelle Cockrell, PT, DPT  04/28/22 3:21 PM

## 2022-05-18 DIAGNOSIS — E782 Mixed hyperlipidemia: Secondary | ICD-10-CM | POA: Diagnosis not present

## 2022-05-18 DIAGNOSIS — M4800 Spinal stenosis, site unspecified: Secondary | ICD-10-CM | POA: Diagnosis not present

## 2022-05-18 DIAGNOSIS — R413 Other amnesia: Secondary | ICD-10-CM | POA: Diagnosis not present

## 2022-05-25 ENCOUNTER — Ambulatory Visit: Payer: Medicare Other | Admitting: Podiatry

## 2022-05-25 ENCOUNTER — Encounter: Payer: Self-pay | Admitting: Podiatry

## 2022-05-25 DIAGNOSIS — L989 Disorder of the skin and subcutaneous tissue, unspecified: Secondary | ICD-10-CM | POA: Diagnosis not present

## 2022-05-25 DIAGNOSIS — M2012 Hallux valgus (acquired), left foot: Secondary | ICD-10-CM

## 2022-05-25 DIAGNOSIS — M79674 Pain in right toe(s): Secondary | ICD-10-CM | POA: Diagnosis not present

## 2022-05-25 DIAGNOSIS — M79675 Pain in left toe(s): Secondary | ICD-10-CM | POA: Diagnosis not present

## 2022-05-25 DIAGNOSIS — B351 Tinea unguium: Secondary | ICD-10-CM | POA: Diagnosis not present

## 2022-05-25 NOTE — Progress Notes (Signed)
This patient presents to the office with chief complaint of long thick painful nails.  Patient says the nails are painful walking and wearing shoes.  This patient is unable to self treat.  This patient is unable to trim her nails since she is unable to reach her nails. She also has callus pain both forefeet.   She presents to the office for preventative foot care services.  General Appearance  Alert, conversant and in no acute stress.  Vascular  Dorsalis pedis and posterior tibial  pulses are palpable  bilaterally.  Capillary return is within normal limits  bilaterally. Temperature is within normal limits  bilaterally.  Neurologic  Senn-Weinstein monofilament wire test within normal limits  bilaterally. Muscle power within normal limits bilaterally.  Nails Thick disfigured discolored nails with subungual debris  from hallux to fifth toes bilaterally. No evidence of bacterial infection or drainage bilaterally.  Orthopedic  No limitations of motion  feet .  No crepitus or effusions noted.  No bony pathology or digital deformities noted.  HAV  left foot.  Skin  normotropic skin with no porokeratosis noted bilaterally.  No signs of infections or ulcers noted.   Callus sub 2  B/L symptomatic.  Onychomycosis  Nails  B/L.  Pain in right toes  Pain in left toes  Callus left foot.  Debridement of nails both feet followed trimming the nails with dremel tool. Debride callus with # 15 blade followed by dremel tool.   RTC 3 months.   Helane Gunther DPM

## 2022-06-29 DIAGNOSIS — M6250 Muscle wasting and atrophy, not elsewhere classified, unspecified site: Secondary | ICD-10-CM | POA: Diagnosis not present

## 2022-07-19 ENCOUNTER — Ambulatory Visit: Payer: Medicare Other | Admitting: Psychiatry

## 2022-07-19 ENCOUNTER — Telehealth: Payer: Self-pay | Admitting: Psychiatry

## 2022-07-19 VITALS — BP 131/70 | HR 70 | Ht 63.0 in | Wt 138.9 lb

## 2022-07-19 DIAGNOSIS — R413 Other amnesia: Secondary | ICD-10-CM

## 2022-07-19 NOTE — Telephone Encounter (Signed)
[employer] medicare NPR sent to GI [phone] 
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
## 2022-07-19 NOTE — Patient Instructions (Signed)
Brain MRI  Blood work to check thyroid and B12 levels

## 2022-07-20 LAB — TSH: TSH: 1.72 u[IU]/mL (ref 0.450–4.500)

## 2022-07-20 LAB — VITAMIN B12: Vitamin B-12: 185 pg/mL — ABNORMAL LOW (ref 232–1245)

## 2022-07-27 DIAGNOSIS — H0100B Unspecified blepharitis left eye, upper and lower eyelids: Secondary | ICD-10-CM | POA: Diagnosis not present

## 2022-07-27 DIAGNOSIS — T1512XA Foreign body in conjunctival sac, left eye, initial encounter: Secondary | ICD-10-CM | POA: Diagnosis not present

## 2022-07-27 DIAGNOSIS — H04123 Dry eye syndrome of bilateral lacrimal glands: Secondary | ICD-10-CM | POA: Diagnosis not present

## 2022-07-27 DIAGNOSIS — H0100A Unspecified blepharitis right eye, upper and lower eyelids: Secondary | ICD-10-CM | POA: Diagnosis not present

## 2022-08-13 ENCOUNTER — Other Ambulatory Visit: Payer: Medicare Other

## 2022-08-25 DIAGNOSIS — W5301XA Bitten by mouse, initial encounter: Secondary | ICD-10-CM | POA: Diagnosis not present

## 2022-08-25 DIAGNOSIS — S61207A Unspecified open wound of left little finger without damage to nail, initial encounter: Secondary | ICD-10-CM | POA: Diagnosis not present

## 2022-08-27 ENCOUNTER — Ambulatory Visit
Admission: RE | Admit: 2022-08-27 | Discharge: 2022-08-27 | Disposition: A | Discharge status: Home / Self Care | Payer: Medicare Other | Source: Ambulatory Visit | Attending: Psychiatry | Admitting: Psychiatry

## 2022-08-27 DIAGNOSIS — R413 Other amnesia: Secondary | ICD-10-CM

## 2022-08-27 MED ORDER — GADOPICLENOL 0.5 MMOL/ML IV SOLN
6.5000 mL | Freq: Once | INTRAVENOUS | Status: AC | PRN
  Administered 2022-08-27: 6.5 mL via INTRAVENOUS

## 2022-09-14 DIAGNOSIS — K625 Hemorrhage of anus and rectum: Secondary | ICD-10-CM | POA: Diagnosis not present

## 2022-09-14 DIAGNOSIS — L290 Pruritus ani: Secondary | ICD-10-CM | POA: Diagnosis not present

## 2022-10-03 ENCOUNTER — Ambulatory Visit (INDEPENDENT_AMBULATORY_CARE_PROVIDER_SITE_OTHER): Payer: Medicare Other | Admitting: Podiatry

## 2022-10-03 ENCOUNTER — Encounter: Payer: Self-pay | Admitting: Podiatry

## 2022-10-03 DIAGNOSIS — M79674 Pain in right toe(s): Secondary | ICD-10-CM | POA: Diagnosis not present

## 2022-10-03 DIAGNOSIS — M79675 Pain in left toe(s): Secondary | ICD-10-CM | POA: Diagnosis not present

## 2022-10-03 DIAGNOSIS — B351 Tinea unguium: Secondary | ICD-10-CM | POA: Diagnosis not present

## 2022-10-03 NOTE — Progress Notes (Signed)
This patient presents to the office with chief complaint of long thick painful nails.  Patient says the nails are painful walking and wearing shoes.  This patient is unable to self treat.  This patient is unable to trim her nails since she is unable to reach her nails.   She presents to the office for preventative foot care services.  General Appearance  Alert, conversant and in no acute stress.  Vascular  Dorsalis pedis and posterior tibial  pulses are palpable  bilaterally.  Capillary return is within normal limits  bilaterally. Temperature is within normal limits  bilaterally.  Neurologic  Senn-Weinstein monofilament wire test within normal limits  bilaterally. Muscle power within normal limits bilaterally.  Nails Thick disfigured discolored nails with subungual debris  from hallux to fifth toes bilaterally. No evidence of bacterial infection or drainage bilaterally.  Orthopedic  No limitations of motion  feet .  No crepitus or effusions noted.  No bony pathology or digital deformities noted.  HAV B/L.  Hammer toes 2,3 left foot.  Skin  normotropic skin with no porokeratosis noted bilaterally.  No signs of infections or ulcers noted.  Onychomycosis  Nails  B/L.  Pain in right toes  Pain in left toes    Debridement of nails both feet followed trimming the nails with dremel tool.   RTC 3 months.   Gardiner Barefoot DPM

## 2022-10-21 DIAGNOSIS — H43813 Vitreous degeneration, bilateral: Secondary | ICD-10-CM | POA: Diagnosis not present

## 2022-10-21 DIAGNOSIS — H04123 Dry eye syndrome of bilateral lacrimal glands: Secondary | ICD-10-CM | POA: Diagnosis not present

## 2022-10-21 DIAGNOSIS — H524 Presbyopia: Secondary | ICD-10-CM | POA: Diagnosis not present

## 2022-10-21 DIAGNOSIS — H26493 Other secondary cataract, bilateral: Secondary | ICD-10-CM | POA: Diagnosis not present

## 2022-11-15 DIAGNOSIS — H26491 Other secondary cataract, right eye: Secondary | ICD-10-CM | POA: Diagnosis not present

## 2022-11-18 DIAGNOSIS — K625 Hemorrhage of anus and rectum: Secondary | ICD-10-CM | POA: Diagnosis not present

## 2022-11-18 DIAGNOSIS — K5909 Other constipation: Secondary | ICD-10-CM | POA: Diagnosis not present

## 2022-11-21 DIAGNOSIS — Z1231 Encounter for screening mammogram for malignant neoplasm of breast: Secondary | ICD-10-CM | POA: Diagnosis not present

## 2022-11-23 DIAGNOSIS — G4700 Insomnia, unspecified: Secondary | ICD-10-CM | POA: Diagnosis not present

## 2022-11-23 DIAGNOSIS — M810 Age-related osteoporosis without current pathological fracture: Secondary | ICD-10-CM | POA: Diagnosis not present

## 2022-11-23 DIAGNOSIS — I700 Atherosclerosis of aorta: Secondary | ICD-10-CM | POA: Diagnosis not present

## 2022-11-23 DIAGNOSIS — E782 Mixed hyperlipidemia: Secondary | ICD-10-CM | POA: Diagnosis not present

## 2022-11-23 DIAGNOSIS — K625 Hemorrhage of anus and rectum: Secondary | ICD-10-CM | POA: Diagnosis not present

## 2022-11-23 DIAGNOSIS — M150 Primary generalized (osteo)arthritis: Secondary | ICD-10-CM | POA: Diagnosis not present

## 2022-11-23 DIAGNOSIS — Z0000 Encounter for general adult medical examination without abnormal findings: Secondary | ICD-10-CM | POA: Diagnosis not present

## 2022-12-29 DIAGNOSIS — M6250 Muscle wasting and atrophy, not elsewhere classified, unspecified site: Secondary | ICD-10-CM | POA: Diagnosis not present

## 2023-01-04 ENCOUNTER — Ambulatory Visit: Payer: Medicare Other | Admitting: Podiatry

## 2023-01-04 ENCOUNTER — Encounter: Payer: Self-pay | Admitting: Podiatry

## 2023-01-04 DIAGNOSIS — M79674 Pain in right toe(s): Secondary | ICD-10-CM

## 2023-01-04 DIAGNOSIS — M79675 Pain in left toe(s): Secondary | ICD-10-CM | POA: Diagnosis not present

## 2023-01-04 DIAGNOSIS — B351 Tinea unguium: Secondary | ICD-10-CM | POA: Diagnosis not present

## 2023-01-04 NOTE — Progress Notes (Signed)
This patient presents to the office with chief complaint of long thick painful nails.  Patient says the nails are painful walking and wearing shoes.  This patient is unable to self treat.  This patient is unable to trim her nails since she is unable to reach her nails.   She presents to the office for preventative foot care services.  General Appearance  Alert, conversant and in no acute stress.  Vascular  Dorsalis pedis and posterior tibial  pulses are palpable  bilaterally.  Capillary return is within normal limits  bilaterally. Temperature is within normal limits  bilaterally.  Neurologic  Senn-Weinstein monofilament wire test within normal limits  bilaterally. Muscle power within normal limits bilaterally.  Nails Thick disfigured discolored nails with subungual debris  from hallux to fifth toes bilaterally. No evidence of bacterial infection or drainage bilaterally.  Orthopedic  No limitations of motion  feet .  No crepitus or effusions noted.  No bony pathology or digital deformities noted.  HAV B/L.  Hammer toes 2,3 left foot.  Skin  normotropic skin with no porokeratosis noted bilaterally.  No signs of infections or ulcers noted.  Onychomycosis  Nails  B/L.  Pain in right toes  Pain in left toes    Debridement of nails both feet followed trimming the nails with dremel tool.   RTC 3 months.   Schuyler Olden DPM   

## 2023-02-08 DIAGNOSIS — K648 Other hemorrhoids: Secondary | ICD-10-CM | POA: Diagnosis not present

## 2023-02-08 DIAGNOSIS — K625 Hemorrhage of anus and rectum: Secondary | ICD-10-CM | POA: Diagnosis not present

## 2023-02-08 DIAGNOSIS — K635 Polyp of colon: Secondary | ICD-10-CM | POA: Diagnosis not present

## 2023-02-10 DIAGNOSIS — K635 Polyp of colon: Secondary | ICD-10-CM | POA: Diagnosis not present

## 2023-03-21 ENCOUNTER — Ambulatory Visit: Payer: Medicare Other | Admitting: Psychiatry

## 2023-03-21 ENCOUNTER — Encounter: Payer: Self-pay | Admitting: Psychiatry

## 2023-03-21 VITALS — BP 125/79 | HR 72 | Ht 63.0 in | Wt 139.0 lb

## 2023-03-21 DIAGNOSIS — R413 Other amnesia: Secondary | ICD-10-CM | POA: Diagnosis not present

## 2023-03-21 DIAGNOSIS — E538 Deficiency of other specified B group vitamins: Secondary | ICD-10-CM

## 2023-03-21 NOTE — Patient Instructions (Signed)
-    Start taking vitamin B12 1000mcg daily

## 2023-03-21 NOTE — Progress Notes (Signed)
   CC:  memory loss  Follow-up Visit  Last visit: 07/19/22  Brief HPI: 77 year old female with a history of HLD, anxiety, depression, OCD, lumbar stenosis s/p fusion who follows in clinic for memory loss. MRI brain 08/27/22 with mild generalized cortical atrophy and minimal microvascular ischemic changes (normal for age).  Interval History: B12 level was found to be low at her last visit. She was recommended to start B12 supplements, but never started this.  Her memory is about the same since her last visit. It does not seem any worse. She continues to have lapses in attention and feels she is distracted easily. Reports a lot of intrusive thoughts throughout the day. She is currently attending therapy for this, which she thinks has been helpful.   Physical Exam:   Vital Signs: BP 125/79 (BP Location: Left Arm, Patient Position: Sitting, Cuff Size: Normal)   Pulse 72   Ht 5\' 3"  (1.6 m)   Wt 139 lb (63 kg)   BMI 24.62 kg/m  GENERAL:  well appearing, in no acute distress, alert  SKIN:  Color, texture, turgor normal. No rashes or lesions HEAD:  Normocephalic/atraumatic. RESP: normal respiratory effort MSK:  No gross joint deformities.   NEUROLOGICAL: Mental Status:     03/21/2023    2:18 PM 07/19/2022    3:06 PM  Montreal Cognitive Assessment   Visuospatial/ Executive (0/5) 5 5  Naming (0/3) 3 3  Attention: Read list of digits (0/2) 2 2  Attention: Read list of letters (0/1) 1 1  Attention: Serial 7 subtraction starting at 100 (0/3) 3 3  Language: Repeat phrase (0/2) 2 2  Language : Fluency (0/1) 1 1  Abstraction (0/2) 2 1  Delayed Recall (0/5) 5 4  Orientation (0/6) 6 6  Total 30 28   Cranial Nerves: PERRL, face symmetric, no dysarthria, hearing grossly intact Motor: moves all extremities equally Gait: normal-based.  IMPRESSION: 77 year old female with a history of HLD, anxiety, depression, OCD, lumbar stenosis s/p fusion who presents for follow up of memory loss. Brain  MRI showed  only mild generalized atrophy and minimal chronic microvascular changes, appropriate for age. MOCA score today is normal at 30/30. Discussed that testing has not shown any evidence of a neurodegenerative disorder. Recommended she start B12 supplements today, which may help with her memory. Recommend she continue with therapy for her OCD and anxiety.  PLAN: -Recommend starting B12 1000 mcg daily -Return to clinic if memory worsens   Follow-up: as needed if memory worsens  I spent a total of 28 minutes on the date of the service. Discussed medication side effects, adverse reactions and drug interactions. Written educational materials and patient instructions outlining all of the above were given.  Ocie Doyne, MD 03/21/23 2:55 PM

## 2023-04-14 ENCOUNTER — Ambulatory Visit
Admission: RE | Admit: 2023-04-14 | Discharge: 2023-04-14 | Disposition: A | Discharge status: Home / Self Care | Payer: Medicare Other | Source: Ambulatory Visit | Attending: Family Medicine | Admitting: Family Medicine

## 2023-04-14 ENCOUNTER — Other Ambulatory Visit: Payer: Self-pay | Admitting: Family Medicine

## 2023-04-14 DIAGNOSIS — M79671 Pain in right foot: Secondary | ICD-10-CM

## 2023-05-04 ENCOUNTER — Encounter: Payer: Self-pay | Admitting: Podiatry

## 2023-05-04 ENCOUNTER — Ambulatory Visit: Payer: Medicare Other | Admitting: Podiatry

## 2023-05-04 VITALS — Ht 63.0 in | Wt 139.0 lb

## 2023-05-04 DIAGNOSIS — B351 Tinea unguium: Secondary | ICD-10-CM | POA: Diagnosis not present

## 2023-05-04 DIAGNOSIS — M79675 Pain in left toe(s): Secondary | ICD-10-CM | POA: Diagnosis not present

## 2023-05-04 DIAGNOSIS — M79674 Pain in right toe(s): Secondary | ICD-10-CM

## 2023-05-04 DIAGNOSIS — S96911A Strain of unspecified muscle and tendon at ankle and foot level, right foot, initial encounter: Secondary | ICD-10-CM

## 2023-05-04 NOTE — Progress Notes (Signed)
Subjective:  Patient ID: Brittany [name], female    DOB: July 26, 1945,  MRN: [id number]  Brittany [name] presents to clinic today for:  Chief Complaint  Patient presents with   Nail Problem    Routine nail care and pulled muscle in top of foot   Patient notes nails are thick, discolored, elongated and painful in shoegear when trying to ambulate.  Notes pain in top of right midfoot  PCP is Lupita Raider, MD.  Past Medical History:  Diagnosis Date   Anemia    h/o   Anxiety    Arthritis    Asthma    as child  no problem since   Degenerative spondylolisthesis    L4-5, grade 1   Depression    Headache    hx migraines, tension headaches   HLD (hyperlipidemia)    Hypercholesteremia    Left lumbar radiculopathy    Lumbago    Lumbar degenerative disc disease    Lumbar spondylosis    Lumbar stenosis with neurogenic claudication    L4-5   OCD (obsessive compulsive disorder)    Osteoporosis    Post-nasal drip    Slow transit constipation    Spinal stenosis    Vitamin D deficiency    Weakness of both lower extremities     Past Surgical History:  Procedure Laterality Date   adnoids     ANTERIOR CERVICAL DECOMP/DISCECTOMY FUSION N/A 03/22/2018   Procedure: ANTERIOR CERVICAL DECOMPRESSION/DISCECTOMY FUSION CERVICAL FOUR - CERVICAL FIVE, CERVICAL FIVE - CERVICAL SIX, CERVICAL SIX- CERVICAL SEVEN;  Surgeon: Shirlean Kelly, MD;  Location: [facility] OR;  Service: Neurosurgery;  Laterality: N/A;  ANTERIOR CERVICAL DECOMPRESSION/DISCECTOMY FUSION CERVICAL FOUR - CERVICAL FIVE, CERVICAL FIVE - CERVICAL SIX, CERVICAL SIX- CERVICAL SEVEN   BACK SURGERY  2017   CHOLECYSTECTOMY N/A 08/15/2017   Procedure: LAPAROSCOPIC CHOLECYSTECTOMY;  Surgeon: Abigail Miyamoto, MD;  Location: [facility] OR;  Service: General;  Laterality: N/A;   EYE SURGERY     bil cataract   TONSILLECTOMY     t+a    Allergies  Allergen Reactions   Tetracyclines & Related Other (See Comments)    Throat ulcer    Penicillins     UNSPECIFIED REACTION  Has patient had a PCN reaction causing immediate rash, facial/tongue/throat swelling, SOB or lightheadedness with hypotension: No Has patient had a PCN reaction causing severe rash involving mucus membranes or skin necrosis: NO Has patient had a PCN reaction that required hospitalization No Has patient had a PCN reaction occurring within the last 10 years: No If all of the above answers are "NO", then may proceed with Cephalosporin     Review of Systems: Negative except as noted in the HPI.  Objective:  Brittany [name] is a pleasant 77 y.o. female in NAD. AAO x 3.  Vascular Examination: Capillary refill time is 3-5 seconds to toes bilateral. Palpable pedal pulses b/l LE. Digital hair present b/l.  Skin temperature gradient WNL b/l. No varicosities b/l. No cyanosis noted b/l. No ecchymosis noted.  Dermatological Examination: Pedal skin with normal turgor, texture and tone b/l. No open wounds. No interdigital macerations b/l. Toenails x10 are 3mm thick, discolored, dystrophic with subungual debris. There is pain with compression of the nail plates.  They are elongated x10  Neurological Examination: Protective sensation intact bilateral LE. Vibratory sensation intact bilateral LE.  Musculoskeletal Examination: Muscle strength 5/5 to all LE muscle groups b/l. Pain along dorsal right midfoot at 2nd/3rd met-cuneiform joint.    Assessment/Plan: 1.  Pain due to onychomycosis of toenails of both feet   2. Strain of right foot, initial encounter    The mycotic toenails were sharply debrided x10 with sterile nail nippers and a power debriding burr to decrease bulk/thickness and length.    Recommend Voltaren gel to right foot strain area BID.  Ace wrap dispensed to patient today to wear during waking hours for support.    Return in about 3 months (around 08/04/2023) for RFC.   Clerance Lav, DPM, FACFAS [facility]     [address]  [place], [place] [address]                Office [phone]  Fax [phone]

## 2023-05-26 DIAGNOSIS — M542 Cervicalgia: Secondary | ICD-10-CM | POA: Diagnosis not present

## 2023-05-26 DIAGNOSIS — S96911A Strain of unspecified muscle and tendon at ankle and foot level, right foot, initial encounter: Secondary | ICD-10-CM | POA: Diagnosis not present

## 2023-05-26 DIAGNOSIS — M4800 Spinal stenosis, site unspecified: Secondary | ICD-10-CM | POA: Diagnosis not present

## 2023-06-20 DIAGNOSIS — M542 Cervicalgia: Secondary | ICD-10-CM | POA: Diagnosis not present

## 2023-06-20 DIAGNOSIS — M4800 Spinal stenosis, site unspecified: Secondary | ICD-10-CM | POA: Diagnosis not present

## 2023-06-22 DIAGNOSIS — M542 Cervicalgia: Secondary | ICD-10-CM | POA: Diagnosis not present

## 2023-06-22 DIAGNOSIS — M4800 Spinal stenosis, site unspecified: Secondary | ICD-10-CM | POA: Diagnosis not present

## 2023-06-28 DIAGNOSIS — M542 Cervicalgia: Secondary | ICD-10-CM | POA: Diagnosis not present

## 2023-06-28 DIAGNOSIS — M4800 Spinal stenosis, site unspecified: Secondary | ICD-10-CM | POA: Diagnosis not present

## 2023-06-30 DIAGNOSIS — M4800 Spinal stenosis, site unspecified: Secondary | ICD-10-CM | POA: Diagnosis not present

## 2023-06-30 DIAGNOSIS — M6250 Muscle wasting and atrophy, not elsewhere classified, unspecified site: Secondary | ICD-10-CM | POA: Diagnosis not present

## 2023-06-30 DIAGNOSIS — M542 Cervicalgia: Secondary | ICD-10-CM | POA: Diagnosis not present

## 2023-08-07 ENCOUNTER — Ambulatory Visit: Payer: Medicare Other | Admitting: Podiatry

## 2023-08-07 DIAGNOSIS — M79674 Pain in right toe(s): Secondary | ICD-10-CM | POA: Diagnosis not present

## 2023-08-07 DIAGNOSIS — B351 Tinea unguium: Secondary | ICD-10-CM | POA: Diagnosis not present

## 2023-08-07 DIAGNOSIS — M79675 Pain in left toe(s): Secondary | ICD-10-CM

## 2023-08-07 NOTE — Progress Notes (Signed)
Subjective:  Patient ID: Brittany [name], female    DOB: September 19, 1945,  MRN: [id number]   Brittany [name] presents to clinic today for:  Chief Complaint  Patient presents with   [facility]    RFC   Patient notes nails are thick, discolored, elongated and painful in shoegear when trying to ambulate.    PCP is Lupita Raider, MD.  Past Medical History:  Diagnosis Date   Anemia    h/o   Anxiety    Arthritis    Asthma    as child  no problem since   Degenerative spondylolisthesis    L4-5, grade 1   Depression    Headache    hx migraines, tension headaches   HLD (hyperlipidemia)    Hypercholesteremia    Left lumbar radiculopathy    Lumbago    Lumbar degenerative disc disease    Lumbar spondylosis    Lumbar stenosis with neurogenic claudication    L4-5   OCD (obsessive compulsive disorder)    Osteoporosis    Post-nasal drip    Slow transit constipation    Spinal stenosis    Vitamin D deficiency    Weakness of both lower extremities     Past Surgical History:  Procedure Laterality Date   adnoids     ANTERIOR CERVICAL DECOMP/DISCECTOMY FUSION N/A 03/22/2018   Procedure: ANTERIOR CERVICAL DECOMPRESSION/DISCECTOMY FUSION CERVICAL FOUR - CERVICAL FIVE, CERVICAL FIVE - CERVICAL SIX, CERVICAL SIX- CERVICAL SEVEN;  Surgeon: Shirlean Kelly, MD;  Location: [facility] OR;  Service: Neurosurgery;  Laterality: N/A;  ANTERIOR CERVICAL DECOMPRESSION/DISCECTOMY FUSION CERVICAL FOUR - CERVICAL FIVE, CERVICAL FIVE - CERVICAL SIX, CERVICAL SIX- CERVICAL SEVEN   BACK SURGERY  2017   CHOLECYSTECTOMY N/A 08/15/2017   Procedure: LAPAROSCOPIC CHOLECYSTECTOMY;  Surgeon: Abigail Miyamoto, MD;  Location: [facility] OR;  Service: General;  Laterality: N/A;   EYE SURGERY     bil cataract   TONSILLECTOMY     t+a    Allergies  Allergen Reactions   Tetracyclines & Related Other (See Comments)    Throat ulcer   Penicillins     UNSPECIFIED REACTION  Has patient had a PCN reaction causing immediate rash,  facial/tongue/throat swelling, SOB or lightheadedness with hypotension: No Has patient had a PCN reaction causing severe rash involving mucus membranes or skin necrosis: NO Has patient had a PCN reaction that required hospitalization No Has patient had a PCN reaction occurring within the last 10 years: No If all of the above answers are "NO", then may proceed with Cephalosporin     Review of Systems: Negative except as noted in the HPI.  Objective:  Brittany [name] is a pleasant 78 y.o. female in NAD. AAO x 3.  Vascular Examination: Capillary refill time is 3-5 seconds to toes bilateral. Palpable pedal pulses b/l LE. Digital hair present b/l.  Skin temperature gradient WNL b/l. No varicosities b/l. No cyanosis noted b/l.   Dermatological Examination: Pedal skin with normal turgor, texture and tone b/l. No open wounds. No interdigital macerations b/l. Toenails x10 are 3mm thick, discolored, dystrophic with subungual debris. There is pain with compression of the nail plates.  They are elongated x10  Assessment/Plan: 1. Pain due to onychomycosis of toenails of both feet     The mycotic toenails were sharply debrided x10 with sterile nail nippers and a power debriding burr to decrease bulk/thickness and length.    Return in about 3 months (around 11/05/2023) for RFC.   Brittany [name] D.  Kodey Xue, DPM, FACFAS [facility]     [address] [place], [place] [address]                Office [phone]  Fax [phone]

## 2023-08-16 DIAGNOSIS — M542 Cervicalgia: Secondary | ICD-10-CM | POA: Diagnosis not present

## 2023-08-16 DIAGNOSIS — M4800 Spinal stenosis, site unspecified: Secondary | ICD-10-CM | POA: Diagnosis not present

## 2023-10-02 DIAGNOSIS — Z79899 Other long term (current) drug therapy: Secondary | ICD-10-CM | POA: Diagnosis not present

## 2023-11-06 ENCOUNTER — Ambulatory Visit: Payer: Medicare Other | Admitting: Podiatry

## 2023-11-06 DIAGNOSIS — L84 Corns and callosities: Secondary | ICD-10-CM

## 2023-11-06 DIAGNOSIS — I739 Peripheral vascular disease, unspecified: Secondary | ICD-10-CM | POA: Diagnosis not present

## 2023-11-06 DIAGNOSIS — M79675 Pain in left toe(s): Secondary | ICD-10-CM | POA: Diagnosis not present

## 2023-11-06 DIAGNOSIS — B351 Tinea unguium: Secondary | ICD-10-CM | POA: Diagnosis not present

## 2023-11-06 DIAGNOSIS — M79674 Pain in right toe(s): Secondary | ICD-10-CM

## 2023-11-06 NOTE — Progress Notes (Unsigned)
 Subjective:  Patient ID: Brittany [name], female    DOB: October 31, 1945,  MRN: [id number]  Brittany [name] presents to clinic today for:  Chief Complaint  Patient presents with   Nail Problem    Nail trim    Patient notes nails are thick and elongated, causing pain in shoe gear when ambulating.  She has a painful callus left submet 2.    PCP is Glena Landau, MD.  Last seen 05/26/23.  Past Medical History:  Diagnosis Date   Anemia    h/o   Anxiety    Arthritis    Asthma    as child  no problem since   Degenerative spondylolisthesis    L4-5, grade 1   Depression    Headache    hx migraines, tension headaches   HLD (hyperlipidemia)    Hypercholesteremia    Left lumbar radiculopathy    Lumbago    Lumbar degenerative disc disease    Lumbar spondylosis    Lumbar stenosis with neurogenic claudication    L4-5   OCD (obsessive compulsive disorder)    Osteoporosis    Post-nasal drip    Slow transit constipation    Spinal stenosis    Vitamin D  deficiency    Weakness of both lower extremities     Allergies  Allergen Reactions   Tetracyclines & Related Other (See Comments)    Throat ulcer   Penicillins     UNSPECIFIED REACTION  Has patient had a PCN reaction causing immediate rash, facial/tongue/throat swelling, SOB or lightheadedness with hypotension: No Has patient had a PCN reaction causing severe rash involving mucus membranes or skin necrosis: NO Has patient had a PCN reaction that required hospitalization No Has patient had a PCN reaction occurring within the last 10 years: No If all of the above answers are "NO", then may proceed with Cephalosporin     Objective: Brittany [name] is a pleasant 78 y.o. female in NAD. AAO x 3.  Vascular Examination: Patient has palpable DP pulse, absent PT pulse bilateral.  Delayed capillary refill bilateral toes.  Sparse digital hair bilateral.  Proximal to distal cooling WNL bilateral.    Dermatological  Examination: Interspaces are clear with no open lesions noted bilateral.  Skin is shiny and atrophic bilateral.  Nails are 3-74mm thick, with yellowish/brown discoloration, subungual debris and distal onycholysis x10.  There is pain with compression of nails x10.  There are hyperkeratotic lesions noted left submet 2. .  Patient qualifies for at-risk foot care because of PVD .  Assessment/Plan: 1. Pain due to onychomycosis of toenails of both feet   2. Callus of foot   3. PVD (peripheral vascular disease) (HCC)    Mycotic nails x10 were sharply debrided with sterile nail nippers and power debriding burr to decrease bulk and length.  Hyperkeratotic lesion left submet 2 was shaved with #312 blade.  She was fitted for a gel toe spacer for the 1st interspace to keep the toes from rubbing together.    Return in about 3 months (around 02/05/2024) for RFC.   Joe Murders, DPM, FACFAS [facility]     [address], [place] [address]  Office [phone]  Fax [phone]

## 2023-11-07 DIAGNOSIS — R5383 Other fatigue: Secondary | ICD-10-CM | POA: Diagnosis not present

## 2023-11-07 DIAGNOSIS — R059 Cough, unspecified: Secondary | ICD-10-CM | POA: Diagnosis not present

## 2023-11-07 DIAGNOSIS — J988 Other specified respiratory disorders: Secondary | ICD-10-CM | POA: Diagnosis not present

## 2023-11-27 DIAGNOSIS — Z1231 Encounter for screening mammogram for malignant neoplasm of breast: Secondary | ICD-10-CM | POA: Diagnosis not present

## 2023-11-29 DIAGNOSIS — G4700 Insomnia, unspecified: Secondary | ICD-10-CM | POA: Diagnosis not present

## 2023-11-29 DIAGNOSIS — E782 Mixed hyperlipidemia: Secondary | ICD-10-CM | POA: Diagnosis not present

## 2023-11-29 DIAGNOSIS — M4800 Spinal stenosis, site unspecified: Secondary | ICD-10-CM | POA: Diagnosis not present

## 2023-11-29 DIAGNOSIS — Z0000 Encounter for general adult medical examination without abnormal findings: Secondary | ICD-10-CM | POA: Diagnosis not present

## 2023-12-06 DIAGNOSIS — N6322 Unspecified lump in the left breast, upper inner quadrant: Secondary | ICD-10-CM | POA: Diagnosis not present

## 2023-12-12 ENCOUNTER — Other Ambulatory Visit: Payer: Self-pay | Admitting: Radiology

## 2023-12-12 DIAGNOSIS — N6325 Unspecified lump in the left breast, overlapping quadrants: Secondary | ICD-10-CM | POA: Diagnosis not present

## 2023-12-12 DIAGNOSIS — N6032 Fibrosclerosis of left breast: Secondary | ICD-10-CM | POA: Diagnosis not present

## 2023-12-13 LAB — SURGICAL PATHOLOGY

## 2023-12-14 DIAGNOSIS — M8588 Other specified disorders of bone density and structure, other site: Secondary | ICD-10-CM | POA: Diagnosis not present

## 2023-12-19 DIAGNOSIS — S4992XA Unspecified injury of left shoulder and upper arm, initial encounter: Secondary | ICD-10-CM | POA: Diagnosis not present

## 2023-12-19 DIAGNOSIS — S60222A Contusion of left hand, initial encounter: Secondary | ICD-10-CM | POA: Diagnosis not present

## 2023-12-28 ENCOUNTER — Other Ambulatory Visit: Payer: Self-pay

## 2023-12-28 DIAGNOSIS — N6322 Unspecified lump in the left breast, upper inner quadrant: Secondary | ICD-10-CM | POA: Diagnosis not present

## 2023-12-28 DIAGNOSIS — N62 Hypertrophy of breast: Secondary | ICD-10-CM | POA: Diagnosis not present

## 2023-12-29 DIAGNOSIS — M6250 Muscle wasting and atrophy, not elsewhere classified, unspecified site: Secondary | ICD-10-CM | POA: Diagnosis not present

## 2023-12-29 LAB — SURGICAL PATHOLOGY

## 2024-02-12 ENCOUNTER — Encounter: Payer: Self-pay | Admitting: Podiatry

## 2024-02-12 ENCOUNTER — Ambulatory Visit: Admitting: Podiatry

## 2024-02-12 DIAGNOSIS — L84 Corns and callosities: Secondary | ICD-10-CM | POA: Diagnosis not present

## 2024-02-12 DIAGNOSIS — M79674 Pain in right toe(s): Secondary | ICD-10-CM

## 2024-02-12 DIAGNOSIS — M79675 Pain in left toe(s): Secondary | ICD-10-CM

## 2024-02-12 DIAGNOSIS — I739 Peripheral vascular disease, unspecified: Secondary | ICD-10-CM

## 2024-02-12 DIAGNOSIS — B351 Tinea unguium: Secondary | ICD-10-CM | POA: Diagnosis not present

## 2024-02-12 NOTE — Progress Notes (Unsigned)
 Subjective:  Patient ID: Brittany [name], female    DOB: September 26, 1945,  MRN: [id number]  Brittany [name] presents to clinic today for:  Chief Complaint  Patient presents with   RFC    Pt is here for RFC toe nail cut.    No pain in feet Has a hard area under L 2nd metatarsal  Pt says she hasn't needed to wear the toe spacer she received last visit   Patient notes nails are thick and elongated, causing pain in shoe gear when ambulating.  She has painful corns and calluses bilateral.  PCP is Loreli Kins, MD. last seen around 01/16/2024  Past Medical History:  Diagnosis Date   Anemia    h/o   Anxiety    Arthritis    Asthma    as child  no problem since   Degenerative spondylolisthesis    L4-5, grade 1   Depression    Headache    hx migraines, tension headaches   HLD (hyperlipidemia)    Hypercholesteremia    Left lumbar radiculopathy    Lumbago    Lumbar degenerative disc disease    Lumbar spondylosis    Lumbar stenosis with neurogenic claudication    L4-5   OCD (obsessive compulsive disorder)    Osteoporosis    Post-nasal drip    Slow transit constipation    Spinal stenosis    Vitamin D  deficiency    Weakness of both lower extremities    Allergies  Allergen Reactions   Tetracyclines & Related Other (See Comments)    Throat ulcer   Penicillins     UNSPECIFIED REACTION  Has patient had a PCN reaction causing immediate rash, facial/tongue/throat swelling, SOB or lightheadedness with hypotension: No Has patient had a PCN reaction causing severe rash involving mucus membranes or skin necrosis: NO Has patient had a PCN reaction that required hospitalization No Has patient had a PCN reaction occurring within the last 10 years: No If all of the above answers are NO, then may proceed with Cephalosporin     Objective:  Brittany [name] is a pleasant 78 y.o. female in NAD. AAO x 3.  Vascular Examination: Patient has palpable DP pulse, absent PT pulse  bilateral.  Delayed capillary refill bilateral toes.  Sparse digital hair bilateral.  Proximal to distal cooling WNL bilateral.    Dermatological Examination: Interspaces are clear with no open lesions noted bilateral.  Skin is shiny and atrophic bilateral.  Nails are 3-54mm thick, with yellowish/brown discoloration, subungual debris and distal onycholysis x10.  There is pain with compression of nails x10.  There are hyperkeratotic lesions noted left submet 2 and bilateral plantar medial aspect of the first MPJ.  Patient qualifies for at-risk foot care because of PVD.  Assessment/Plan: 1. Pain due to onychomycosis of toenails of both feet   2. Callus of foot   3. PVD (peripheral vascular disease) (HCC)    Mycotic nails x10 were sharply debrided with sterile nail nippers and power debriding burr to decrease bulk and length.  Hyperkeratotic lesions x 3 were shaved with #312 blade and the locations listed in the dermatological examination above.  They are all proximal to the toe joints  Return in about 3 months (around 05/14/2024) for RFC.   Awanda CHARM Imperial, DPM, FACFAS [facility]     [address]  [place], [place] [address]                Office [phone]  Fax [phone]

## 2024-03-14 DIAGNOSIS — M4800 Spinal stenosis, site unspecified: Secondary | ICD-10-CM | POA: Diagnosis not present

## 2024-03-14 DIAGNOSIS — M25512 Pain in left shoulder: Secondary | ICD-10-CM | POA: Diagnosis not present

## 2024-03-14 DIAGNOSIS — R270 Ataxia, unspecified: Secondary | ICD-10-CM | POA: Diagnosis not present

## 2024-03-14 DIAGNOSIS — G4700 Insomnia, unspecified: Secondary | ICD-10-CM | POA: Diagnosis not present

## 2024-03-14 DIAGNOSIS — H6122 Impacted cerumen, left ear: Secondary | ICD-10-CM | POA: Diagnosis not present

## 2024-03-26 DIAGNOSIS — R0689 Other abnormalities of breathing: Secondary | ICD-10-CM | POA: Diagnosis not present

## 2024-03-26 DIAGNOSIS — R0683 Snoring: Secondary | ICD-10-CM | POA: Diagnosis not present

## 2024-04-04 DIAGNOSIS — R234 Changes in skin texture: Secondary | ICD-10-CM | POA: Diagnosis not present

## 2024-04-08 DIAGNOSIS — G4733 Obstructive sleep apnea (adult) (pediatric): Secondary | ICD-10-CM | POA: Diagnosis not present

## 2024-04-17 DIAGNOSIS — G4733 Obstructive sleep apnea (adult) (pediatric): Secondary | ICD-10-CM | POA: Diagnosis not present

## 2024-05-08 DIAGNOSIS — G4733 Obstructive sleep apnea (adult) (pediatric): Secondary | ICD-10-CM | POA: Diagnosis not present

## 2024-05-13 ENCOUNTER — Encounter: Payer: Self-pay | Admitting: Podiatry

## 2024-05-13 ENCOUNTER — Ambulatory Visit: Admitting: Podiatry

## 2024-05-13 DIAGNOSIS — M79674 Pain in right toe(s): Secondary | ICD-10-CM

## 2024-05-13 DIAGNOSIS — M79675 Pain in left toe(s): Secondary | ICD-10-CM | POA: Diagnosis not present

## 2024-05-13 DIAGNOSIS — B351 Tinea unguium: Secondary | ICD-10-CM

## 2024-05-13 NOTE — Progress Notes (Signed)
 Subjective:  Patient ID: Brittany [name], female    DOB: 13-Feb-1946,  MRN: [id number]  Brittany [name] presents to clinic today for:  Chief Complaint  Patient presents with   RFC    RFC Non diabetic toenail trim.    Patient notes nails are thick, discolored, elongated and painful in shoegear when trying to ambulate.    PCP is Brittany Kins, Brittany [name].  Past Medical History:  Diagnosis Date   Anemia    h/o   Anxiety    Arthritis    Asthma    as child  no problem since   Degenerative spondylolisthesis    L4-5, grade 1   Depression    Headache    hx migraines, tension headaches   HLD (hyperlipidemia)    Hypercholesteremia    Left lumbar radiculopathy    Lumbago    Lumbar degenerative disc disease    Lumbar spondylosis    Lumbar stenosis with neurogenic claudication    L4-5   OCD (obsessive compulsive disorder)    Osteoporosis    Post-nasal drip    Slow transit constipation    Spinal stenosis    Vitamin D  deficiency    Weakness of both lower extremities    Past Surgical History:  Procedure Laterality Date   adnoids     ANTERIOR CERVICAL DECOMP/DISCECTOMY FUSION N/A 03/22/2018   Procedure: ANTERIOR CERVICAL DECOMPRESSION/DISCECTOMY FUSION CERVICAL FOUR - CERVICAL FIVE, CERVICAL FIVE - CERVICAL SIX, CERVICAL SIX- CERVICAL SEVEN;  Surgeon: Alix Charleston, Brittany [name];  Location: [facility] OR;  Service: Neurosurgery;  Laterality: N/A;  ANTERIOR CERVICAL DECOMPRESSION/DISCECTOMY FUSION CERVICAL FOUR - CERVICAL FIVE, CERVICAL FIVE - CERVICAL SIX, CERVICAL SIX- CERVICAL SEVEN   BACK SURGERY  2017   CHOLECYSTECTOMY N/A 08/15/2017   Procedure: LAPAROSCOPIC CHOLECYSTECTOMY;  Surgeon: Vernetta Berg, Brittany [name];  Location: [facility] OR;  Service: General;  Laterality: N/A;   EYE SURGERY     bil cataract   TONSILLECTOMY     t+a   Allergies  Allergen Reactions   Tetracyclines & Related Other (See Comments)    Throat ulcer   Penicillins     UNSPECIFIED REACTION  Has patient had a PCN reaction  causing immediate rash, facial/tongue/throat swelling, SOB or lightheadedness with hypotension: No Has patient had a PCN reaction causing severe rash involving mucus membranes or skin necrosis: NO Has patient had a PCN reaction that required hospitalization No Has patient had a PCN reaction occurring within the last 10 years: No If all of the above answers are NO, then may proceed with Cephalosporin     Review of Systems: Negative except as noted in the HPI.  Objective:  Brittany [name] is a pleasant 78 y.o. female in NAD. AAO x 3.  Vascular Examination: Capillary refill time is 3-5 seconds to toes bilateral. Palpable pedal pulses b/l LE. Digital hair present b/l.  Skin temperature gradient WNL b/l. No varicosities b/l. No cyanosis noted b/l.   Dermatological Examination: Pedal skin with normal turgor, texture and tone b/l. No open wounds. No interdigital macerations b/l. Toenails x10 are 3mm thick, discolored, dystrophic with subungual debris. There is pain with compression of the nail plates.  They are elongated x10  Assessment/Plan: 1. Pain due to onychomycosis of toenails of both feet     The mycotic toenails were sharply debrided x10 with sterile nail nippers and a power debriding burr to decrease bulk/thickness and length.    Return in about 3 months (around 08/13/2024) for RFC.   Brittany [name] D. Tansy Lorek,  DPM, FACFAS [facility]     [address] [place], [place] [address]                Office [phone]  Fax [phone]

## 2024-08-12 ENCOUNTER — Ambulatory Visit: Admitting: Podiatry
